# Patient Record
Sex: Female | Born: 1960
Health system: Southern US, Community
[De-identification: ages and names within clinical notes are randomized; demographics above are authoritative.]

## PROBLEM LIST (undated history)

## (undated) DIAGNOSIS — N029 Recurrent and persistent hematuria with unspecified morphologic changes: Secondary | ICD-10-CM

## (undated) DIAGNOSIS — J45909 Unspecified asthma, uncomplicated: Secondary | ICD-10-CM

## (undated) DIAGNOSIS — E785 Hyperlipidemia, unspecified: Secondary | ICD-10-CM

## (undated) DIAGNOSIS — M1990 Unspecified osteoarthritis, unspecified site: Secondary | ICD-10-CM

## (undated) DIAGNOSIS — I10 Essential (primary) hypertension: Secondary | ICD-10-CM

## (undated) DIAGNOSIS — F329 Major depressive disorder, single episode, unspecified: Secondary | ICD-10-CM

## (undated) DIAGNOSIS — M26609 Unspecified temporomandibular joint disorder, unspecified side: Secondary | ICD-10-CM

## (undated) DIAGNOSIS — F32A Depression, unspecified: Secondary | ICD-10-CM

## (undated) DIAGNOSIS — T7840XA Allergy, unspecified, initial encounter: Secondary | ICD-10-CM

## (undated) DIAGNOSIS — E559 Vitamin D deficiency, unspecified: Secondary | ICD-10-CM

## (undated) HISTORY — DX: Hyperlipidemia, unspecified: E78.5

## (undated) HISTORY — DX: Unspecified osteoarthritis, unspecified site: M19.90

## (undated) HISTORY — DX: Essential (primary) hypertension: I10

## (undated) HISTORY — DX: Unspecified temporomandibular joint disorder, unspecified side: M26.609

## (undated) HISTORY — DX: Recurrent and persistent hematuria with unspecified morphologic changes: N02.9

## (undated) HISTORY — DX: Major depressive disorder, single episode, unspecified: F32.9

## (undated) HISTORY — DX: Allergy, unspecified, initial encounter: T78.40XA

## (undated) HISTORY — DX: Unspecified asthma, uncomplicated: J45.909

## (undated) HISTORY — PX: ABDOMINAL HYSTERECTOMY: SHX81

## (undated) HISTORY — DX: Vitamin D deficiency, unspecified: E55.9

## (undated) HISTORY — PX: TUBAL LIGATION: SHX77

## (undated) HISTORY — PX: BREAST SURGERY: SHX581

## (undated) HISTORY — DX: Depression, unspecified: F32.A

---

## 1998-02-23 ENCOUNTER — Encounter: Payer: Self-pay | Admitting: Internal Medicine

## 1998-02-23 ENCOUNTER — Ambulatory Visit (HOSPITAL_COMMUNITY): Admission: RE | Admit: 1998-02-23 | Discharge: 1998-02-23 | Payer: Self-pay | Admitting: Internal Medicine

## 1999-07-11 ENCOUNTER — Encounter: Payer: Self-pay | Admitting: General Surgery

## 1999-07-13 ENCOUNTER — Encounter: Payer: Self-pay | Admitting: General Surgery

## 1999-07-13 ENCOUNTER — Encounter (INDEPENDENT_AMBULATORY_CARE_PROVIDER_SITE_OTHER): Payer: Self-pay | Admitting: Specialist

## 1999-07-13 ENCOUNTER — Ambulatory Visit (HOSPITAL_COMMUNITY): Admission: RE | Admit: 1999-07-13 | Discharge: 1999-07-13 | Payer: Self-pay | Admitting: General Surgery

## 1999-07-16 ENCOUNTER — Encounter: Payer: Self-pay | Admitting: General Surgery

## 2000-11-25 ENCOUNTER — Encounter: Admission: RE | Admit: 2000-11-25 | Discharge: 2000-11-25 | Payer: Self-pay | Admitting: *Deleted

## 2000-11-25 ENCOUNTER — Encounter: Payer: Self-pay | Admitting: *Deleted

## 2001-03-11 HISTORY — PX: TOTAL ABDOMINAL HYSTERECTOMY: SHX209

## 2001-05-14 ENCOUNTER — Ambulatory Visit (HOSPITAL_BASED_OUTPATIENT_CLINIC_OR_DEPARTMENT_OTHER): Admission: RE | Admit: 2001-05-14 | Discharge: 2001-05-14 | Payer: Self-pay | Admitting: Obstetrics and Gynecology

## 2001-05-14 ENCOUNTER — Encounter (INDEPENDENT_AMBULATORY_CARE_PROVIDER_SITE_OTHER): Payer: Self-pay | Admitting: Specialist

## 2001-07-02 ENCOUNTER — Inpatient Hospital Stay (HOSPITAL_COMMUNITY): Admission: RE | Admit: 2001-07-02 | Discharge: 2001-07-03 | Payer: Self-pay | Admitting: Obstetrics and Gynecology

## 2001-07-02 ENCOUNTER — Encounter (INDEPENDENT_AMBULATORY_CARE_PROVIDER_SITE_OTHER): Payer: Self-pay | Admitting: Specialist

## 2002-03-11 DIAGNOSIS — N029 Recurrent and persistent hematuria with unspecified morphologic changes: Secondary | ICD-10-CM

## 2002-03-11 HISTORY — DX: Recurrent and persistent hematuria with unspecified morphologic changes: N02.9

## 2002-03-29 ENCOUNTER — Encounter: Payer: Self-pay | Admitting: Internal Medicine

## 2002-03-29 ENCOUNTER — Ambulatory Visit (HOSPITAL_COMMUNITY): Admission: RE | Admit: 2002-03-29 | Discharge: 2002-03-29 | Payer: Self-pay | Admitting: Internal Medicine

## 2004-04-18 ENCOUNTER — Ambulatory Visit (HOSPITAL_COMMUNITY): Admission: RE | Admit: 2004-04-18 | Discharge: 2004-04-18 | Payer: Self-pay | Admitting: Internal Medicine

## 2008-06-29 ENCOUNTER — Other Ambulatory Visit: Admission: RE | Admit: 2008-06-29 | Discharge: 2008-06-29 | Payer: Self-pay | Admitting: Internal Medicine

## 2009-07-10 ENCOUNTER — Ambulatory Visit (HOSPITAL_COMMUNITY): Admission: RE | Admit: 2009-07-10 | Discharge: 2009-07-10 | Payer: Self-pay | Admitting: Internal Medicine

## 2009-08-22 ENCOUNTER — Ambulatory Visit (HOSPITAL_COMMUNITY): Admission: RE | Admit: 2009-08-22 | Discharge: 2009-08-22 | Payer: Self-pay | Admitting: Internal Medicine

## 2010-07-19 ENCOUNTER — Other Ambulatory Visit: Payer: Self-pay | Admitting: Internal Medicine

## 2010-07-19 ENCOUNTER — Other Ambulatory Visit (HOSPITAL_COMMUNITY)
Admission: RE | Admit: 2010-07-19 | Discharge: 2010-07-19 | Disposition: A | Discharge status: Home / Self Care | Payer: 59 | Source: Ambulatory Visit | Attending: Internal Medicine | Admitting: Internal Medicine

## 2010-07-19 DIAGNOSIS — Z01419 Encounter for gynecological examination (general) (routine) without abnormal findings: Secondary | ICD-10-CM | POA: Insufficient documentation

## 2010-07-26 ENCOUNTER — Ambulatory Visit
Admission: RE | Admit: 2010-07-26 | Discharge: 2010-07-26 | Disposition: A | Discharge status: Home / Self Care | Payer: 59 | Source: Ambulatory Visit | Attending: Internal Medicine | Admitting: Internal Medicine

## 2010-07-26 ENCOUNTER — Other Ambulatory Visit: Payer: Self-pay | Admitting: Internal Medicine

## 2010-07-26 DIAGNOSIS — R05 Cough: Secondary | ICD-10-CM

## 2010-07-27 NOTE — Discharge Summary (Signed)
[facility]  Patient:    Nicole, Phelps Visit Number: [id number] MRN: [id number]          Service Type: GYN Location: 4W 0444 01 Attending Physician:  Lendon Colonel Dictated by:   Kathie Rhodes. Kyra Manges, M.D. Admit Date:  07/02/2001 Discharge Date: 07/03/2001                             Discharge Summary  ADMISSION DIAGNOSIS:  Excessive menstruation.  DISCHARGE DIAGNOSIS:  Excessive menstruation.  PROCEDURE:  Total abdominal hysterectomy.  HISTORY OF PRESENT ILLNESS:  [name] is a 50 year old female who presented with heavy bleeding despite hysteroscopy, despite manipulation with birth control pills.  At hysteroscopy a large mucous myoma was resected; however, she continued to bleed and hemoglobin has dropped.  She desires definitive correction.  HOSPITAL COURSE:  She was admitted to the hospital.  Labs revealed a hemoglobin of 12.9, a hematocrit of 37.  She underwent an uneventful hysterectomy, was discharged the following evening on day 1 of her postop course to home and office care.  She was asked to return to the office in two weeks, to call for fever or bleeding.  She was given Percocet for pain.  CONDITION ON DISCHARGE:  Improved. Dictated by:   S. Kyra Manges, M.D. Attending Physician:  Lendon Colonel DD:  07/14/01 TD:  07/16/01 Job: [id number]

## 2010-07-27 NOTE — Op Note (Signed)
[facility]  Patient:    SAHORY, NORDLING                         MRN: [id number] Proc. Date: 07/13/99 Adm. Date:  [id number] Disc. Date: [id number] Attending:  Glenna Fellows [place]                           Operative Report  PREOPERATIVE DIAGNOSIS:  Left breast mass.  POSTOPERATIVE DIAGNOSIS:  Left breast mass.  SURGICAL PROCEDURE:  Needle localized left breast biopsy.  SURGEON:  Lorne Skeens. Hoxworth, M.D.  ANESTHESIA:  Local with IV sedation  BRIEF HISTORY:  Ms. Brigandi is a 50 year old white female, who, on recent screening mammogram was found to have a new nodular density in the medial left breast.  There was no definite palpable abnormality on physical examination. After discussion of options, we have elected to proceed with excisional biopsy.  The nature of the procedure, its indications, risk of bleeding, infection were discussed and understood.  She is now brought to the operating room for this procedure following successful needle localization.  DESCRIPTION OF OPERATION:  Patient brought to the operating room, placed in the supine position on the operating table and IV sedation was administered. The left breast was sterilely prepped and draped.  Local anesthesia was used to infiltrate the skin near the wire insertion site in the medial breast and the underlying breast tissue.   A transverse incision was made in the medial aspect of the breast and dissection carried down through the subcutaneous tissue to the breast capsule.  The wire was identified and brought into the incision and dissection was carried down along the wire and a discreet, approximately 1 x 2 cm  smooth, rounded mass consistent with a fibroadenoma was encountered.  It was completely sharply excised.  Hemostasis was obtained with cautery.  The subcu was reapproximated with interrupted 4-0 Monocryl and the skin closed with running subcuticular 4-0 Monocryl and  Steri-Strips. Sponge, needle and instrument counts were correct.  Dry sterile dressing was applied and patient taken to recovery in good condition. DD:  07/13/99 TD:  07/16/99 Job: [id number]

## 2010-07-27 NOTE — Op Note (Signed)
[facility]  Patient:    Nicole Phelps, Nicole Phelps Visit Number: [id number] MRN: [id number]          Service Type: NES Location: NESC Attending Physician:  Lendon Colonel Dictated by:   Kathie Rhodes. Kyra Manges, M.D. Proc. Date: 05/14/01 Admit Date:  05/14/2001                             Operative Report  PREOPERATIVE DIAGNOSES: 1. Abnormal uterine bleeding. 2. Menorrhagia.  POSTOPERATIVE DIAGNOSES: 1. Abnormal uterine bleeding. 2. Menorrhagia.  OPERATION:  Hysteroscopy with resection.  DESCRIPTION OF PROCEDURE:  The patient was placed in lithotomy position and prepped and draped in the usual fashion.  Cervix carefully dilated.  The endometrial cavity was retroverted.  Scope was inserted into the uterus, and the hysteroscope was done for resection.  There was a submucous nodule that was noted in the dome of the fundus.  This was resected, and a general endometrial resection was accomplished.  No unusual blood loss occurred.  All of the resected material was sent to the lab for study.  [name] tolerated it well. Dictated by:   S. Kyra Manges, M.D. Attending Physician:  Lendon Colonel DD:  05/14/01 TD:  05/14/01 Job: [id number]

## 2010-07-27 NOTE — H&P (Signed)
[facility]  Patient:    Nicole Phelps, Nicole Phelps Visit Number: [id number] MRN: [id number]          Service Type: NES Location: NESC Attending Physician:  Lendon Colonel Dictated by:   Kathie Rhodes. Kyra Manges, M.D. Admit Date:  05/14/2001 Discharge Date: 05/14/2001                           History and Physical  CHIEF COMPLAINT:  Heavy periods.  HISTORY OF PRESENT ILLNESS:  The patient is a 50 year old gravida 2, para 2 female who presents for continued bleeding despite hysteroscopy and manipulation with birth control pills.  At hysteroscopy she had a large submucous myoma that was resected.  Her hemoglobin has dropped a gram in a week, and so she desires to proceed with hysterectomy.  MEDICATIONS:  Effexor and Allegra-D.  PAST SURGICAL HISTORY: 1. Previous tubal ligation. 2. Two breast biopsies.  ALLERGIES:  No known allergies.  REVIEW OF SYSTEMS:  No headaches or dizziness.  No history of migraines. HEART:  No hypertension.  No rheumatic fever.  No heart murmur.  LUNGS:  No chronic cough.  No hemoptysis.  GENITOURINARY:  She denies stress incontinence or frequency.  No history of UTI.  GASTROINTESTINAL:  Negative.  MUSCLES, BONES, JOINTS:  No arthritis or fractures.  SOCIAL HISTORY:  She is a Haematologist.  Smokes two cigarettes daily.  FAMILY HISTORY:  Mother is living at 82.  Father died at 83.  Her father had a stroke and enlarged heart.  Her mother had colon cancer, as did an aunt.  PHYSICAL EXAMINATION:  GENERAL:  Well-developed and nourished female who appears to be her stated age.  VITAL SIGNS:  Weight 184.  Blood pressure 120/70.  HEENT:  Unremarkable.  Oropharynx is not injected.  NECK:  Supple.  Carotid pulses are equal, without bruits.  Thyroid is not enlarged.  Trachea is in the midline.  BREASTS:  No masses or tenderness.  HEART:  Normal sinus rhythm.  No murmurs.  LUNGS:  Clear to P&A.  ABDOMEN:  Soft, flat.  Liver,  spleen, and kidneys are not palpated.  Bowel sounds are normal.  No bruits are heard.  EXTREMITIES:  Good range of motion.  Equal pulses and reflexes.  PELVIC:  Clean cervix.  Uterus is retroverted.  It appears to be slightly enlarged and well supported.  RECTOVAGINAL:  Confirms.  IMPRESSION:  Continued abnormal uterine bleeding.  PLAN:  TAH.  The risks and benefits have been discussed with the patient. Dictated by:   S. Kyra Manges, M.D. Attending Physician:  Lendon Colonel DD:  07/01/01 TD:  07/01/01 Job: [phone]

## 2010-07-27 NOTE — Op Note (Signed)
[facility]  Patient:    Nicole Phelps, Nicole Phelps Visit Number: [id number] MRN: [id number]          Service Type: GYN Location: 1S X002 01 Attending Physician:  Lendon Colonel Dictated by:   Kathie Rhodes. Kyra Manges, M.D. Proc. Date: 07/02/01 Admit Date:  07/02/2001                             Operative Report  PREOPERATIVE DIAGNOSIS:  Persistent menorrhagia.  POSTOPERATIVE DIAGNOSIS:  Persistent menorrhagia.  OPERATION: 1. Examination under anesthesia. 2. Total abdominal hysterectomy. 3. Exploratory laparotomy.  SURGEON:  Katherine Roan, M.D.  ANESTHESIA:  General.  DESCRIPTION OF PROCEDURE:  The patient was placed in the operating room and general anesthesia administered. Examination under anesthesia revealed a retroverted, well supported uterus. No masses were noted. The patient was prepped and draped. Foley catheter was inserted. A transverse incision was made in the abdomen and extended in layers to the peritoneal cavity which was entered vertically. Exploration of the upper abdomen revealed a normal liver and gallbladder and two normal kidneys. No periaortic adenopathy or free fluid was noted. The abdominal viscera were then packed away from the pelvic viscera very carefully and the uterus was elevated with Kelly clamps. Both ovaries were normal. The utero-ovarian anastomosis was transected and ligated with 0 chromic sutures and free ties. The round ligaments were handled in a similar fashion. The uterine vessels were skeletonized and ligated, the cardinals and uterosacral ligaments were clamped, ligated, and tagged for identification. The angles of the vagina were entered and the specimen was removed from the operative field. The vagina was then closed as horizontal mattress sutures of 0 chromic and figure-of-eight sutures of 0 Vicryl. The vagina was closed in a vertical plane. Following this, her uterosacral and cardinal complexes were sutured  into the vagina for vault support. Reperitonealization was accomplished with 3-0 Vicryl. Hemostasis was secure. There was no unusual blood loss or complication. The parietal peritoneum was then closed with 2-0 PDS. Sponge and needle count was correct. The parietal peritoneum was closed with 2-0 PDS. The fascia was closed with a running suture of 2-0 PDS and two interrupted sutures of 0 Vicryl. Subcutaneum was irrigated and found to be hemostatically secure and the skin was closed with a subcuticular 4-0 PDS. A dry sterile dressing was applied and Marcaine 0.5% with epinephrine was instilled into the incision. Dictated by:   S. Kyra Manges, M.D. Attending Physician:  Lendon Colonel DD:  07/02/01 TD:  07/02/01 Job: [phone]

## 2011-07-22 ENCOUNTER — Ambulatory Visit (HOSPITAL_COMMUNITY)
Admission: RE | Admit: 2011-07-22 | Discharge: 2011-07-22 | Disposition: A | Discharge status: Home / Self Care | Payer: 59 | Source: Ambulatory Visit | Attending: Physician Assistant | Admitting: Physician Assistant

## 2011-07-22 ENCOUNTER — Other Ambulatory Visit (HOSPITAL_COMMUNITY)
Admission: RE | Admit: 2011-07-22 | Discharge: 2011-07-22 | Disposition: A | Discharge status: Home / Self Care | Payer: 59 | Source: Ambulatory Visit | Attending: Internal Medicine | Admitting: Internal Medicine

## 2011-07-22 ENCOUNTER — Other Ambulatory Visit: Payer: Self-pay

## 2011-07-22 ENCOUNTER — Other Ambulatory Visit (HOSPITAL_COMMUNITY): Payer: Self-pay | Admitting: Physician Assistant

## 2011-07-22 DIAGNOSIS — I10 Essential (primary) hypertension: Secondary | ICD-10-CM

## 2011-07-22 DIAGNOSIS — F17200 Nicotine dependence, unspecified, uncomplicated: Secondary | ICD-10-CM

## 2011-07-22 DIAGNOSIS — Z0000 Encounter for general adult medical examination without abnormal findings: Secondary | ICD-10-CM | POA: Insufficient documentation

## 2011-07-22 DIAGNOSIS — N760 Acute vaginitis: Secondary | ICD-10-CM | POA: Insufficient documentation

## 2011-07-22 DIAGNOSIS — Z01419 Encounter for gynecological examination (general) (routine) without abnormal findings: Secondary | ICD-10-CM | POA: Insufficient documentation

## 2011-10-07 ENCOUNTER — Emergency Department (HOSPITAL_COMMUNITY)
Admission: EM | Admit: 2011-10-07 | Discharge: 2011-10-07 | Disposition: A | Discharge status: Home / Self Care | Payer: 59 | Attending: Emergency Medicine | Admitting: Emergency Medicine

## 2011-10-07 ENCOUNTER — Emergency Department (HOSPITAL_COMMUNITY): Payer: 59

## 2011-10-07 ENCOUNTER — Encounter (HOSPITAL_COMMUNITY): Payer: Self-pay | Admitting: *Deleted

## 2011-10-07 DIAGNOSIS — R05 Cough: Secondary | ICD-10-CM

## 2011-10-07 DIAGNOSIS — R059 Cough, unspecified: Secondary | ICD-10-CM | POA: Insufficient documentation

## 2011-10-07 DIAGNOSIS — F17200 Nicotine dependence, unspecified, uncomplicated: Secondary | ICD-10-CM | POA: Insufficient documentation

## 2011-10-07 LAB — CBC
HCT: 40.5 % (ref 36.0–46.0)
Hemoglobin: 14.2 g/dL (ref 12.0–15.0)
MCHC: 35.1 g/dL (ref 30.0–36.0)
MCV: 89.2 fL (ref 78.0–100.0)
Platelets: 315 10*3/uL (ref 150–400)

## 2011-10-07 LAB — POCT I-STAT, CHEM 8
Calcium, Ion: 1.15 mmol/L (ref 1.12–1.23)
Creatinine, Ser: 0.9 mg/dL (ref 0.50–1.10)
Glucose, Bld: 112 mg/dL — ABNORMAL HIGH (ref 70–99)
HCT: 41 % (ref 36.0–46.0)
Hemoglobin: 13.9 g/dL (ref 12.0–15.0)
Potassium: 3.3 mEq/L — ABNORMAL LOW (ref 3.5–5.1)
Sodium: 141 mEq/L (ref 135–145)
TCO2: 21 mmol/L (ref 0–100)

## 2011-10-07 MED ORDER — MORPHINE SULFATE 4 MG/ML IJ SOLN
6.0000 mg | Freq: Once | INTRAMUSCULAR | Status: AC
  Administered 2011-10-07: 6 mg via INTRAVENOUS
  Filled 2011-10-07: qty 2

## 2011-10-07 MED ORDER — ONDANSETRON HCL 4 MG/2ML IJ SOLN
4.0000 mg | Freq: Once | INTRAMUSCULAR | Status: AC
  Administered 2011-10-07: 4 mg via INTRAVENOUS
  Filled 2011-10-07: qty 2

## 2011-10-07 MED ORDER — POTASSIUM CHLORIDE CRYS ER 20 MEQ PO TBCR
40.0000 meq | EXTENDED_RELEASE_TABLET | Freq: Once | ORAL | Status: AC
  Administered 2011-10-07: 40 meq via ORAL
  Filled 2011-10-07: qty 2

## 2011-10-07 MED ORDER — ALBUTEROL SULFATE (5 MG/ML) 0.5% IN NEBU
5.0000 mg | INHALATION_SOLUTION | Freq: Once | RESPIRATORY_TRACT | Status: AC
  Administered 2011-10-07: 5 mg via RESPIRATORY_TRACT
  Filled 2011-10-07: qty 1

## 2011-10-07 MED ORDER — AZITHROMYCIN 250 MG PO TABS: ORAL_TABLET | ORAL | Status: AC

## 2011-10-07 MED ORDER — AZITHROMYCIN 250 MG PO TABS
500.0000 mg | ORAL_TABLET | Freq: Once | ORAL | Status: AC
  Administered 2011-10-07: 500 mg via ORAL
  Filled 2011-10-07: qty 2

## 2011-10-07 MED ORDER — HYDROCOD POLST-CHLORPHEN POLST 10-8 MG/5ML PO LQCR: 5.0000 mL | Freq: Two times a day (BID) | ORAL | Status: DC

## 2011-10-07 NOTE — ED Provider Notes (Signed)
History     CSN: [id number]  Arrival date & time 10/07/11  1139   First MD Initiated Contact with Patient 10/07/11 1215      No chief complaint on file.   (Consider location/radiation/quality/duration/timing/severity/associated sxs/prior treatment) HPI Complains of nonproductive cough shortness of breath rhinorrhea nasal congestion and diffuse headache gradual in onset 4 days ago. Denies fever.seen by physician's assistant at Dr. Kathryne Sharper office this morning. Had breathing treatments which she states provided no relief. Presents here for further evaluation. Complains of bilateral rib pain worse with coughing. Nothing makes symptoms better. No other associated symptoms pain is nonradiating over bilateral RIBS sharp in quality History reviewed. No pertinent past medical history.  Past Surgical History  Procedure Date  . Abdominal hysterectomy   . Tubal ligation     History reviewed. No pertinent family history.  History  Substance Use Topics  . Smoking status: Current Everyday Smoker  . Smokeless tobacco: Never Used  . Alcohol Use: Yes   Marijuana use OB History    Grav Para Term Preterm Abortions TAB SAB Ect Mult Living                  Review of Systems  HENT: Positive for rhinorrhea.   Respiratory: Positive for cough.   Cardiovascular: Positive for chest pain.  Gastrointestinal: Positive for vomiting.        posttussive vomiting  Neurological: Positive for weakness and headaches.  All other systems reviewed and are negative.    Allergies  Vicodin  Home Medications   Current Outpatient Rx  Name Route Sig Dispense Refill  . ALBUTEROL SULFATE HFA 108 (90 BASE) MCG/ACT IN AERS Inhalation Inhale 2 puffs into the lungs every 6 (six) hours as needed. Shortness of breath    . CETIRIZINE HCL 10 MG PO TABS Oral Take 10 mg by mouth daily.    Marland Kitchen VITAMIN D 1000 UNITS PO TABS Oral Take 1,000 Units by mouth daily.    Marland Kitchen ESCITALOPRAM OXALATE 20 MG PO TABS Oral Take 20 mg by  mouth daily.    . ADULT MULTIVITAMIN W/MINERALS CH Oral Take 1 tablet by mouth daily.    Marland Kitchen PREGABALIN 25 MG PO CAPS Oral Take 25 mg by mouth daily.      BP 131/72  Pulse 83  Temp 98.1 F (36.7 C) (Oral)  Resp 14  SpO2 100%  Physical Exam  Nursing note and vitals reviewed. Constitutional: She is oriented to person, place, and time. She appears well-developed and well-nourished.  HENT:  Head: Normocephalic and atraumatic.  Eyes: Conjunctivae are normal. Pupils are equal, round, and reactive to light.  Neck: Neck supple. No tracheal deviation present. No thyromegaly present.  Cardiovascular: Normal rate and regular rhythm.   No murmur heard. Pulmonary/Chest: Effort normal and breath sounds normal.  Abdominal: Soft. Bowel sounds are normal. She exhibits no distension. There is no tenderness.  Musculoskeletal: Normal range of motion. She exhibits no edema and no tenderness.  Neurological: She is alert and oriented to person, place, and time. Coordination normal.  Skin: Skin is warm and dry. No rash noted.  Psychiatric: She has a normal mood and affect.    Date: 10/07/2011  Rate: 80  Rhythm: normal sinus rhythm  QRS Axis: normal  Intervals: normal  ST/T Wave abnormalities: normal  Conduction Disutrbances: none  Narrative Interpretation: unremarkable   no old EKG for comparison  ED Course  Procedures (including critical care time)  Labs Reviewed  CBC - Abnormal; Notable for the  following:    WBC 18.9 (*)     All other components within normal limits   No results found. Results for orders placed during the hospital encounter of 10/07/11  CBC      Component Value Range   WBC 18.9 (*) 4.0 - 10.5 K/uL   RBC 4.54  3.87 - 5.11 MIL/uL   Hemoglobin 14.2  12.0 - 15.0 g/dL   HCT 96.0  45.4 - 09.8 %   MCV 89.2  78.0 - 100.0 fL   MCH 31.3  26.0 - 34.0 pg   MCHC 35.1  30.0 - 36.0 g/dL   RDW 11.9  14.7 - 82.9 %   Platelets 315  150 - 400 K/uL  POCT I-STAT, CHEM 8       Component Value Range   Sodium 141  135 - 145 mEq/L   Potassium 3.3 (*) 3.5 - 5.1 mEq/L   Chloride 106  96 - 112 mEq/L   BUN 14  6 - 23 mg/dL   Creatinine, Ser 5.62  0.50 - 1.10 mg/dL   Glucose, Bld 130 (*) 70 - 99 mg/dL   Calcium, Ion 8.65  7.84 - 1.23 mmol/L   TCO2 21  0 - 100 mmol/L   Hemoglobin 13.9  12.0 - 15.0 g/dL   HCT 69.6  29.5 - 28.4 %   Dg Chest 2 View  10/07/2011  *RADIOLOGY REPORT*  Clinical Data: Chest pain  CHEST - 2 VIEW  C #3 hypokalemiaomparison: 07/22/2011  Findings: Normal heart size.  Clear lungs.  No pneumothorax and no pleural effusion.  No definite rib fracture.  IMPRESSION: No active cardiopulmonary disease.  Original Report Authenticated By: Donavan Burnet, M.D.    Chest x-ray reviewed by me No diagnosis found. of a the your he complaints  2:00 feels improved after treatment with morphine and Zofran states breathing almost normal 3:30 PM states breathing is normal after treatment with a beer all nebulizer. On reexamination she speaks in paragraphs occasional cough  MDM  Strongly doubt pulmonary embolism with gradual onset symptoms and cough, rhinorrhea in light of posttussive vomiting we'll treat with antibiotic consider pertussis Plan prescription Zithromax. Smoking cessation strongly encouraged spent 3-5 minutes counseling patient on stopping smoking albuterol 2 puffs every 4 hours when necessary shortness of breath or cough prescription Tussionex Diagnosis cough differential diagnosis includes atypical pneumonia, pertussis, bronchitis  #2 tobacco abuse #3 hypokalmia        Doug Sou, MD 10/07/11 1540

## 2011-10-07 NOTE — ED Notes (Signed)
Pt states started getting sick on Thursday, congested, states "I've been coughing up stuff and blowing out stuff". Pt states she can't breath and her toes, fingers and arms up to elbows are numb. Went to PCP this am and received breathing tx, pt states did not help, was sent here by PCP.

## 2011-10-07 NOTE — ED Notes (Signed)
Pt was sent by PCP today, went to PCP this am for congestion, HA, SOB and weakness, pt states sickness started on Thursday and has worsened. Pt was given duoneb at doctor's office, pt states did not help with the SOB. PCP sent pt to r/o PE vs. Panic attack. Pt is anxious and breathing is labored and fast. Pt states she is having chest pain d/t the difficulty breathing. States fingers and toes are numb, along with hands up to elbows are numb. Pt on cardiac monitor, 99% on RA. Pt instructed to take slow deep breaths.

## 2012-09-04 ENCOUNTER — Ambulatory Visit (HOSPITAL_COMMUNITY)
Admission: RE | Admit: 2012-09-04 | Discharge: 2012-09-04 | Disposition: A | Discharge status: Home / Self Care | Payer: 59 | Source: Ambulatory Visit | Attending: Internal Medicine | Admitting: Internal Medicine

## 2012-09-04 ENCOUNTER — Other Ambulatory Visit (HOSPITAL_COMMUNITY): Payer: Self-pay | Admitting: Internal Medicine

## 2012-09-04 DIAGNOSIS — R091 Pleurisy: Secondary | ICD-10-CM | POA: Insufficient documentation

## 2012-09-04 DIAGNOSIS — I10 Essential (primary) hypertension: Secondary | ICD-10-CM | POA: Insufficient documentation

## 2012-09-04 DIAGNOSIS — F17200 Nicotine dependence, unspecified, uncomplicated: Secondary | ICD-10-CM

## 2013-03-03 ENCOUNTER — Encounter: Payer: Self-pay | Admitting: Physician Assistant

## 2013-03-03 DIAGNOSIS — E785 Hyperlipidemia, unspecified: Secondary | ICD-10-CM

## 2013-03-03 DIAGNOSIS — E782 Mixed hyperlipidemia: Secondary | ICD-10-CM | POA: Insufficient documentation

## 2013-03-03 DIAGNOSIS — F329 Major depressive disorder, single episode, unspecified: Secondary | ICD-10-CM | POA: Insufficient documentation

## 2013-03-03 DIAGNOSIS — F32A Depression, unspecified: Secondary | ICD-10-CM

## 2013-03-03 DIAGNOSIS — E559 Vitamin D deficiency, unspecified: Secondary | ICD-10-CM | POA: Insufficient documentation

## 2013-03-03 DIAGNOSIS — I10 Essential (primary) hypertension: Secondary | ICD-10-CM

## 2013-03-09 ENCOUNTER — Ambulatory Visit (INDEPENDENT_AMBULATORY_CARE_PROVIDER_SITE_OTHER): Payer: 59 | Admitting: Physician Assistant

## 2013-03-09 ENCOUNTER — Encounter: Payer: Self-pay | Admitting: Physician Assistant

## 2013-03-09 VITALS — BP 120/78 | HR 76 | Temp 98.1°F | Resp 16 | Ht 65.0 in | Wt 186.0 lb

## 2013-03-09 DIAGNOSIS — E538 Deficiency of other specified B group vitamins: Secondary | ICD-10-CM

## 2013-03-09 DIAGNOSIS — I10 Essential (primary) hypertension: Secondary | ICD-10-CM

## 2013-03-09 DIAGNOSIS — E785 Hyperlipidemia, unspecified: Secondary | ICD-10-CM

## 2013-03-09 DIAGNOSIS — E559 Vitamin D deficiency, unspecified: Secondary | ICD-10-CM

## 2013-03-09 LAB — LIPID PANEL
LDL Cholesterol: 139 mg/dL — ABNORMAL HIGH (ref 0–99)
Triglycerides: 222 mg/dL — ABNORMAL HIGH (ref <150)
VLDL: 44 mg/dL — ABNORMAL HIGH (ref 0–40)

## 2013-03-09 LAB — CBC WITH DIFFERENTIAL/PLATELET
Eosinophils Absolute: 0.1 10*3/uL (ref 0.0–0.7)
Eosinophils Relative: 1 % (ref 0–5)
HCT: 41.9 % (ref 36.0–46.0)
Hemoglobin: 14.3 g/dL (ref 12.0–15.0)
Lymphs Abs: 2.8 10*3/uL (ref 0.7–4.0)
MCH: 29.9 pg (ref 26.0–34.0)
MCV: 87.7 fL (ref 78.0–100.0)
Monocytes Absolute: 0.5 10*3/uL (ref 0.1–1.0)
Monocytes Relative: 7 % (ref 3–12)
RBC: 4.78 MIL/uL (ref 3.87–5.11)

## 2013-03-09 LAB — BASIC METABOLIC PANEL WITH GFR
BUN: 12 mg/dL (ref 6–23)
CO2: 25 mEq/L (ref 19–32)
Calcium: 9.7 mg/dL (ref 8.4–10.5)
Creat: 0.78 mg/dL (ref 0.50–1.10)
GFR, Est African American: >89 mL/min
Glucose, Bld: 83 mg/dL (ref 70–99)

## 2013-03-09 MED ORDER — FLUTICASONE PROPIONATE 50 MCG/ACT NA SUSP: 1.0000 | Freq: Every day | NASAL | Status: DC

## 2013-03-09 NOTE — Patient Instructions (Addendum)
Cholesterol Cholesterol is a white, waxy, fat-like protein needed by your body in small amounts. The liver makes all the cholesterol you need. It is carried from the liver by the blood through the blood vessels. Deposits (plaque) may build up on blood vessel walls. This makes the arteries narrower and stiffer. Plaque increases the risk for heart attack and stroke. You cannot feel your cholesterol level even if it is very high. The only way to know is by a blood test to check your lipid (fats) levels. Once you know your cholesterol levels, you should keep a record of the test results. Work with your caregiver to to keep your levels in the desired range. WHAT THE RESULTS MEAN:  Total cholesterol is a rough measure of all the cholesterol in your blood.  LDL is the so-called bad cholesterol. This is the type that deposits cholesterol in the walls of the arteries. You want this level to be low.  HDL is the good cholesterol because it cleans the arteries and carries the LDL away. You want this level to be high.  Triglycerides are fat that the body can either burn for energy or store. High levels are closely linked to heart disease. DESIRED LEVELS:  Total cholesterol below 200.  LDL below 100 for people at risk, below 70 for very high risk.  HDL above 50 is good, above 60 is best.  Triglycerides below 150. HOW TO LOWER YOUR CHOLESTEROL:  Diet.  Choose fish or white meat chicken and [place], roasted or baked. Limit fatty cuts of red meat, fried foods, and processed meats, such as sausage and lunch meat.  Eat lots of fresh fruits and vegetables. Choose whole grains, beans, pasta, potatoes and cereals.  Use only small amounts of olive, corn or canola oils. Avoid butter, mayonnaise, shortening or palm kernel oils. Avoid foods with trans-fats.  Use skim/nonfat milk and low-fat/nonfat yogurt and cheeses. Avoid whole milk, cream, ice cream, egg yolks and cheeses. Healthy desserts include angel food  cake, ginger snaps, animal crackers, hard candy, popsicles, and low-fat/nonfat frozen yogurt. Avoid pastries, cakes, pies and cookies.  Exercise.  A regular program helps decrease LDL and raises HDL.  Helps with weight control.  Do things that increase your activity level like gardening, walking, or taking the stairs.  Medication.  May be prescribed by your caregiver to help lowering cholesterol and the risk for heart disease.  You may need medicine even if your levels are normal if you have several risk factors. HOME CARE INSTRUCTIONS   Follow your diet and exercise programs as suggested by your caregiver.  Take medications as directed.  Have blood work done when your caregiver feels it is necessary. MAKE SURE YOU:   Understand these instructions.  Will watch your condition.  Will get help right away if you are not doing well or get worse. Document Released: 11/20/2000 Document Revised: 05/20/2011 Document Reviewed: 05/13/2007 [facility] Patient Information 2014 [place], [place].    Bad carbs also include fruit juice, alcohol, and sweet tea. These are empty calories that do not signal to your brain that you are full.   Please remember the good carbs are still carbs which convert into sugar. So please measure them out no more than 1/2-1 cup of rice, oatmeal, pasta, and beans.  Veggies are however free foods! Pile them on.   I like lean protein at every meal such as chicken, [place], pork chops, cottage cheese, etc. Just do not fry these meats and please center your meal  around vegetable, the meats should be a side dish.   No all fruit is created equal. Please see the list below, the fruit at the bottom is higher in sugars than the fruit at the top   Vitamin B12 Deficiency- ADD SUBLINGUAL B12 Not having enough vitamin B12 is called a deficiency. Vitamin B12 is an important vitamin. Your body needs vitamin B12 to:   Make red blood cells.  Make DNA. This is the genetic  material inside all of your cells.  Help your nerves work properly so they can carry messages from your brain to your body. CAUSES  Not eating enough foods that contain vitamin B12.  Not having enough stomach acid and digestive juices. The body needs these to absorb vitamin B12 from the food you eat.  Having certain digestive system diseases that make it hard to absorb vitamin B12. These diseases include Crohn's disease, chronic pancreatitis, and cystic fibrosis.  Having pernicious anemia, which is a condition where the body has too few red blood cells. People with this condition do not make enough of a protein called "intrinsic factor," which is needed to absorb vitamin B12.  Having a surgery in which part of the stomach or small intestine is removed.  Taking certain medicines that make it hard for the body to absorb vitamin B12. These medicines include:  Heartburn medicine (antacids and proton pump inhibitors).  A certain antibiotic medicine called neomycin, which fights infection.  Some medicines used to treat diabetes, tuberculosis, gout, and high cholesterol. RISK FACTORS Risk factors are things that make you more likely to develop a vitamin B12 deficiency. They include:  Being older than 50.  Being a vegetarian.  Being pregnant and a vegetarian or having a poor diet.  Taking certain drugs.  Being an alcoholic. SYMPTOMS You may have a vitamin B12 deficiency with no symptoms. However, a vitamin B12 deficiency can cause health problems like anemia and nerve damage. These health problems can lead to many possible symptoms, including:  Weakness.  Fatigue.  Loss of appetite.  Weight loss.  Numbness or tingling in your hands and feet.  Redness and burning of the tongue.  Confusion or memory problems.  Depression.  Dizziness.  Sensory problems, such as loss of taste, color blindness, and ringing in the ears.  Diarrhea or constipation.  Trouble  walking. DIAGNOSIS Various types of tests can be given to help find the cause of your vitamin B12 deficiency. These tests include:  A complete blood count (CBC). This test gives your caregiver an overall picture of what makes up your blood.  A blood test to measure your B12 level.  A blood test to measure intrinsic factor.  An endoscopy. This procedure uses a thin tube with a camera on the end to look into your stomach or intestines. TREATMENT Treatment for vitamin B12 deficiency depends on what is causing it. Common options include:  Changing your eating and drinking habits, such as:  Eating more foods that contain vitamin B12.  Not drinking as much alcohol or any alcohol.  Taking vitamin B12 supplements. Your caregiver will tell you what dose is best for you.  Getting vitamin B12 injections. Some people get these a few times a week. Others get them once a month. HOME CARE INSTRUCTIONS  Take all supplements as directed by your caregiver. Follow the directions carefully.  Get any injections your caregiver prescribes. Do not miss your appointments.  Eat lots of healthy foods that contain vitamin B12. Ask your caregiver if  you should work with a nutritionist. Good things to include in your diet are:  Meat.  Poultry.  Fish.  Eggs.  Fortified cereal and dairy products. This means vitamin B12 has been added to the food. Check the label on the package to be sure.  Do not abuse alcohol.  Keep all follow-up appointments. Your caregiver will need to perform blood tests to make sure your vitamin B12 deficiency is going away. SEEK MEDICAL CARE IF:  You have any questions about your treatment.  Your symptoms come back. MAKE SURE YOU:  Understand these instructions.  Will watch your condition.  Will get help right away if you are not doing well or get worse. Document Released: 05/20/2011 Document Reviewed: 05/20/2011 [facility] Patient Information 2014 [place],  [place].  Palpitations  A palpitation is the feeling that your heartbeat is irregular. It may feel like your heart is fluttering or skipping a beat. It may also feel like your heart is beating faster than normal. This is usually not a serious problem. In some cases, you may need more medical tests. HOME CARE  Avoid:  Caffeine in coffee, tea, soft drinks, diet pills, and energy drinks.  Chocolate.  Alcohol.  Stop smoking if you smoke.  Reduce your stress and anxiety. Try:  A method that measures bodily functions so you can learn to control them (biofeedback).  Yoga.  Meditation.  Physical activity such as swimming, jogging, or walking.  Get plenty of rest and sleep. GET HELP RIGHT AWAY IF:   You have chest pain.  You feel short of breath.  You have a very bad headache.  You feel dizzy or pass out (faint).  Your fast or irregular heartbeat continues after 24 hours.  Your palpitations occur more often. MAKE SURE YOU:   Understand these instructions.  Will watch your condition.  Will get help right away if you are not doing well or get worse. Document Released: 12/05/2007 Document Revised: 08/27/2011 Document Reviewed: 04/26/2011 [facility] Patient Information 2014 [place], [place].

## 2013-03-09 NOTE — Progress Notes (Signed)
HPI Patient presents for 3 month follow up with hypertension, hyperlipidemia, and vitamin D. Patient's blood pressure has been controlled at home, today their BP is BP: 120/78 mmHg  Patient denies chest pain, shortness of breath, dizziness.  She states she has had heart fluttering for a long time but recently it has been happening more often the last few weeks. It is happening several times a day, lasts seconds, nonexertional, denies accompaniments with it. She does smoke and does drink caffeine.  Patient's cholesterol is diet controlled. The cholesterol last visit was LDL 134 B12 last visit was 321  States with Advair, even with washing her mouth out with Listerine she has a film on her mouth/tongue.  Patient is on Vitamin D supplement.  Vitamin D 71.  Current Medications:  Current Outpatient Prescriptions on File Prior to Visit  Medication Sig Dispense Refill  . albuterol (PROVENTIL HFA;VENTOLIN HFA) 108 (90 BASE) MCG/ACT inhaler Inhale 2 puffs into the lungs every 6 (six) hours as needed. Shortness of breath      . ALPRAZolam (XANAX) 1 MG tablet Take 1 mg by mouth at bedtime as needed for anxiety.      . cetirizine (ZYRTEC) 10 MG tablet Take 10 mg by mouth daily.      . chlorpheniramine-HYDROcodone (TUSSIONEX PENNKINETIC ER) 10-8 MG/5ML LQCR Take 5 mLs by mouth every 12 (twelve) hours.  60 mL  0  . cholecalciferol (VITAMIN D) 1000 UNITS tablet Take 2,000 Units by mouth daily.       Marland Kitchen escitalopram (LEXAPRO) 20 MG tablet Take 20 mg by mouth daily.      . Fluticasone-Salmeterol (ADVAIR) 250-50 MCG/DOSE AEPB Inhale 1 puff into the lungs 2 (two) times daily.      . Multiple Vitamin (MULTIVITAMIN WITH MINERALS) TABS Take 1 tablet by mouth daily.      . pregabalin (LYRICA) 25 MG capsule Take 25 mg by mouth daily.       No current facility-administered medications on file prior to visit.   Medical History:  Past Medical History  Diagnosis Date  . Hypertension   . Hyperlipidemia   . Depression    . Asthma   . Arthritis   . TMJ (temporomandibular joint syndrome)   . Benign hematuria 2004  . Allergy   . Vitamin D deficiency    Allergies:  Allergies  Allergen Reactions  . Celexa [Citalopram Hydrobromide]     Myalgias  . Vicodin [Hydrocodone-Acetaminophen] Nausea And Vomiting  . Wellbutrin [Bupropion]     anxiety    ROS Constitutional: Denies fever, chills, headaches, insomnia, fatigue, night sweats Eyes: Denies redness, blurred vision, diplopia, discharge, itchy, watery eyes.  ENT: Denies congestion, post nasal drip, sore throat, earache, dental pain, Tinnitus, Vertigo, Sinus pain, snoring.  Cardio: +palpitations Denies chest pain, irregular heartbeat, dyspnea, diaphoresis, orthopnea, PND, claudication, edema Respiratory: denies cough, shortness of breath, wheezing.  Gastrointestinal: Denies dysphagia, heartburn, AB pain/ cramps, N/V, diarrhea, constipation, hematemesis, melena, hematochezia,  hemorrhoids Genitourinary: Denies dysuria, frequency, urgency, nocturia, hesitancy, discharge, hematuria, flank pain Musculoskeletal: Denies myalgia, stiffness, pain, swelling and strain/sprain. Skin: Denies pruritis, rash, changing in skin lesion Neuro: Denies Weakness, tremor, incoordination, spasms, pain Psychiatric: Denies confusion, memory loss, sensory loss Endocrine: Denies change in weight, skin, hair change, nocturia Diabetic Polys, Denies visual blurring, hyper /hypo glycemic episodes, and paresthesia, Heme/Lymph: Denies Excessive bleeding, bruising, enlarged lymph nodes  Family history- Review and unchanged Social history- Review and unchanged Physical Exam: Filed Vitals:   03/09/13 1354  BP: 120/78  Pulse: 76  Temp: 98.1 F (36.7 C)  Resp: 16   Filed Weights   03/09/13 1354  Weight: 186 lb (84.369 kg)   General Appearance: Well nourished, in no apparent distress. Eyes: PERRLA, EOMs, conjunctiva no swelling or erythema Sinuses: No Frontal/maxillary  tenderness ENT/Mouth: Ext aud canals clear, TMs without erythema, bulging. No erythema, swelling, or exudate on post pharynx.  Tonsils not swollen or erythematous. Hearing normal.  Neck: Supple, thyroid normal.  Respiratory: Respiratory effort normal, BS equal bilaterally without rales, rhonchi, wheezing or stridor.  Cardio: RRR with no MRGs. Brisk peripheral pulses without edema.  Abdomen: Soft, + BS.  Non tender, no guarding, rebound, hernias, masses. Lymphatics: Non tender without lymphadenopathy.  Musculoskeletal: Full ROM, 5/5 strength, normal gait.  Skin: Warm, dry without rashes, lesions, ecchymosis.  Neuro: Cranial nerves intact. Normal muscle tone, no cerebellar symptoms. Sensation intact.  Psych: Awake and oriented X 3, normal affect, Insight and Judgment appropriate.   Assessment and Plan:  Hypertension: Continue medication, monitor blood pressure at home.  Continue DASH diet. Cholesterol: Continue diet and exercise. Check cholesterol.  Vitamin D Def- check level and continue medications.  B12 def- check B12.  Palpitations- likely benign, try to stop smoking and decrease caffeine. If get accompaniments let us know we can add BB and send to cardio.  COPD- Try symbicort samples given, 160/4.5 and wash mouth afterwards.  Continue diet and meds as discussed. Further disposition pending results of labs.  Quentin Mulling 2:02 PM

## 2013-03-10 LAB — VITAMIN B12: Vitamin B-12: 295 pg/mL (ref 211–911)

## 2013-03-10 LAB — TSH: TSH: 3.452 u[IU]/mL (ref 0.350–4.500)

## 2013-03-15 ENCOUNTER — Other Ambulatory Visit: Payer: Self-pay | Admitting: Physician Assistant

## 2013-06-07 ENCOUNTER — Other Ambulatory Visit: Payer: Self-pay | Admitting: Physician Assistant

## 2013-08-06 ENCOUNTER — Other Ambulatory Visit: Payer: Self-pay | Admitting: Physician Assistant

## 2013-08-26 ENCOUNTER — Telehealth: Payer: Self-pay | Admitting: Internal Medicine

## 2013-08-26 NOTE — Telephone Encounter (Signed)
RESCHEDULING CPE WITH AC

## 2013-09-01 ENCOUNTER — Encounter: Payer: Self-pay | Admitting: Physician Assistant

## 2013-09-01 ENCOUNTER — Telehealth: Payer: Self-pay

## 2013-09-01 DIAGNOSIS — E2839 Other primary ovarian failure: Secondary | ICD-10-CM

## 2013-09-01 NOTE — Telephone Encounter (Signed)
Patient requesting to have a bone density at the same time as mammogram. Can you put in order please? Patient also needs T-dap at CPE on 09-14-13

## 2013-09-14 ENCOUNTER — Ambulatory Visit (HOSPITAL_COMMUNITY)
Admission: RE | Admit: 2013-09-14 | Discharge: 2013-09-14 | Disposition: A | Discharge status: Home / Self Care | Payer: 59 | Source: Ambulatory Visit | Attending: Physician Assistant | Admitting: Physician Assistant

## 2013-09-14 ENCOUNTER — Encounter: Payer: Self-pay | Admitting: Physician Assistant

## 2013-09-14 ENCOUNTER — Ambulatory Visit (INDEPENDENT_AMBULATORY_CARE_PROVIDER_SITE_OTHER): Payer: 59 | Admitting: Physician Assistant

## 2013-09-14 ENCOUNTER — Other Ambulatory Visit: Payer: Self-pay

## 2013-09-14 VITALS — BP 122/78 | HR 64 | Temp 97.9°F | Resp 16 | Ht 65.0 in | Wt 185.0 lb

## 2013-09-14 DIAGNOSIS — I517 Cardiomegaly: Secondary | ICD-10-CM | POA: Insufficient documentation

## 2013-09-14 DIAGNOSIS — I10 Essential (primary) hypertension: Secondary | ICD-10-CM

## 2013-09-14 DIAGNOSIS — F17200 Nicotine dependence, unspecified, uncomplicated: Secondary | ICD-10-CM

## 2013-09-14 DIAGNOSIS — F329 Major depressive disorder, single episode, unspecified: Secondary | ICD-10-CM

## 2013-09-14 DIAGNOSIS — Z23 Encounter for immunization: Secondary | ICD-10-CM

## 2013-09-14 DIAGNOSIS — Z0000 Encounter for general adult medical examination without abnormal findings: Secondary | ICD-10-CM

## 2013-09-14 DIAGNOSIS — F32A Depression, unspecified: Secondary | ICD-10-CM

## 2013-09-14 LAB — CBC WITH DIFFERENTIAL/PLATELET
Basophils Absolute: 0.1 10*3/uL (ref 0.0–0.1)
Basophils Relative: 1 % (ref 0–1)
EOS PCT: 2 % (ref 0–5)
Eosinophils Absolute: 0.2 10*3/uL (ref 0.0–0.7)
HEMATOCRIT: 40.1 % (ref 36.0–46.0)
Hemoglobin: 14 g/dL (ref 12.0–15.0)
LYMPHS PCT: 24 % (ref 12–46)
Lymphs Abs: 1.9 10*3/uL (ref 0.7–4.0)
MCH: 31.2 pg (ref 26.0–34.0)
MCHC: 34.9 g/dL (ref 30.0–36.0)
MCV: 89.3 fL (ref 78.0–100.0)
MONO ABS: 0.5 10*3/uL (ref 0.1–1.0)
Monocytes Relative: 6 % (ref 3–12)
Neutro Abs: 5.4 10*3/uL (ref 1.7–7.7)
Neutrophils Relative %: 67 % (ref 43–77)
Platelets: 331 10*3/uL (ref 150–400)
RBC: 4.49 MIL/uL (ref 3.87–5.11)
RDW: 13.6 % (ref 11.5–15.5)
WBC: 8 10*3/uL (ref 4.0–10.5)

## 2013-09-14 LAB — HEMOGLOBIN A1C
HEMOGLOBIN A1C: 5.4 % (ref <5.7)
Mean Plasma Glucose: 108 mg/dL (ref <117)

## 2013-09-14 MED ORDER — FLUTICASONE FUROATE-VILANTEROL 100-25 MCG/INH IN AEPB: INHALATION_SPRAY | RESPIRATORY_TRACT | Status: DC

## 2013-09-14 MED ORDER — MONTELUKAST SODIUM 10 MG PO TABS: ORAL_TABLET | ORAL | Status: DC

## 2013-09-14 MED ORDER — ALBUTEROL SULFATE HFA 108 (90 BASE) MCG/ACT IN AERS: 2.0000 | INHALATION_SPRAY | Freq: Four times a day (QID) | RESPIRATORY_TRACT | Status: DC | PRN

## 2013-09-14 MED ORDER — ESCITALOPRAM OXALATE 20 MG PO TABS: ORAL_TABLET | ORAL | Status: DC

## 2013-09-14 MED ORDER — FLUTICASONE PROPIONATE 50 MCG/ACT NA SUSP: 1.0000 | Freq: Every day | NASAL | Status: DC

## 2013-09-14 MED ORDER — ALPRAZOLAM 1 MG PO TABS: 1.0000 mg | ORAL_TABLET | Freq: Every day | ORAL | Status: DC

## 2013-09-14 MED ORDER — PREGABALIN 25 MG PO CAPS: 25.0000 mg | ORAL_CAPSULE | Freq: Every evening | ORAL | Status: DC | PRN

## 2013-09-14 MED ORDER — ALPRAZOLAM 1 MG PO TABS: 1.0000 mg | ORAL_TABLET | Freq: Every evening | ORAL | Status: DC | PRN

## 2013-09-14 NOTE — Patient Instructions (Signed)

## 2013-09-14 NOTE — Progress Notes (Signed)
Complete Physical  Assessment and Plan: Hypertension-- continue medications, DASH diet, exercise and monitor at home. Call if greater than 130/80.   Hyperlipidemia--continue medications, check lipids, decrease fatty foods, increase activity.   Depression-remission with lexapro  Asthma- on advair, but would like to try new med  Arthritis- controlled  TMJ (temporomandibular joint syndrome)- wearing night guard and states it is better  Benign hematuria  Allergic rhinitis- Allegra OTC, increase H20, allergy hygiene explained.  Vitamin D deficiency  TDAP MGM and DEXA today Colonoscopy due in 2019 PAP next year Smoking cessation counseled- she is not ready to quit at this time.   Discussed med's effects and SE's. Screening labs and tests as requested with regular follow-up as recommended.  HPI 53 y.o. female  presents for a complete physical. Her blood pressure has been controlled at home, today their BP is BP: 122/78 mmHg She does workout, walks daily.  She denies chest pain, shortness of breath, dizziness.  She is not on cholesterol medication and denies myalgias. Her cholesterol is not at goal. The cholesterol last visit was:   Lab Results  Component Value Date   CHOL 236* 03/09/2013   HDL 53 03/09/2013   LDLCALC 139* 03/09/2013   TRIG 222* 03/09/2013   CHOLHDL 4.5 03/09/2013   Last A1C in the office was: 5.3 Has COPD and uses Advair and albuterol.  B12 was 321.  Patient is on Vitamin D supplement, 1200IU.   Lab Results  Component Value Date   VD25OH 89 03/09/2013   She continues to smoke, last CXR 09/04/2012 with bilateral lung thickening.    Current Medications:  Current Outpatient Prescriptions on File Prior to Visit  Medication Sig Dispense Refill  . albuterol (PROVENTIL HFA;VENTOLIN HFA) 108 (90 BASE) MCG/ACT inhaler Inhale 2 puffs into the lungs every 6 (six) hours as needed. Shortness of breath      . ALPRAZolam (XANAX) 1 MG tablet Take 1 mg by mouth at bedtime as  needed for anxiety.      . cetirizine (ZYRTEC) 10 MG tablet Take 10 mg by mouth daily.      . chlorpheniramine-HYDROcodone (TUSSIONEX PENNKINETIC ER) 10-8 MG/5ML LQCR Take 5 mLs by mouth every 12 (twelve) hours.  60 mL  0  . cholecalciferol (VITAMIN D) 1000 UNITS tablet Take 2,000 Units by mouth daily.       Marland Kitchen escitalopram (LEXAPRO) 20 MG tablet Take 1 tablet by mouth  daily  90 tablet  1  . fluticasone (FLONASE) 50 MCG/ACT nasal spray Place 1 spray into both nostrils daily.  16 g  2  . Fluticasone-Salmeterol (ADVAIR) 250-50 MCG/DOSE AEPB Inhale 1 puff into the lungs 2 (two) times daily.      . montelukast (SINGULAIR) 10 MG tablet Take 1 tablet by mouth  daily  90 tablet  2  . Multiple Vitamin (MULTIVITAMIN WITH MINERALS) TABS Take 1 tablet by mouth daily.      . pregabalin (LYRICA) 25 MG capsule Take 25 mg by mouth daily.       No current facility-administered medications on file prior to visit.   Health Maintenance:   Immunization History  Administered Date(s) Administered  . Pneumococcal-Unspecified 04/03/2009  . Td 04/03/2004   Tetanus:2006 will do TDAP this year, she is keeping 31 week old and has another grandson on the way (makes 5 total) Pneumovax: 2011 Flu vaccine: 2014 Zostavax: N/A Pap: 07/2011 due 2016 MGM: 07/2012 goes today  DEXA: goes today Colonoscopy: 08/2012 polyps due 5 years EGD: N/A CXR  09/04/2012 with bilateral lung thickening  Allergies:  Allergies  Allergen Reactions  . Celexa [Citalopram Hydrobromide]     Myalgias  . Vicodin [Hydrocodone-Acetaminophen] Nausea And Vomiting  . Wellbutrin [Bupropion]     anxiety   Medical History:  Past Medical History  Diagnosis Date  . Hypertension   . Hyperlipidemia   . Depression   . Asthma   . Arthritis   . TMJ (temporomandibular joint syndrome)   . Benign hematuria 2004  . Allergy   . Vitamin D deficiency    Surgical History:  Past Surgical History  Procedure Laterality Date  . Abdominal hysterectomy     . Tubal ligation    . Breast surgery Left     lumpectomy benign   Family History:  Family History  Problem Relation Age of Onset  . Cancer Mother     colon  . Arthritis Mother   . Hypertension Father   . Stroke Father    Social History:  History  Substance Use Topics  . Smoking status: Current Every Day Smoker -- 0.50 packs/day for 25 years  . Smokeless tobacco: Never Used  . Alcohol Use: Yes    Review of Systems: [X]  = complains of  [ ]  = denies  General: Fatigue [ ]  Fever [ ]  Chills [ ]  Weakness [ ]   Insomnia [ ] Weight change [ ]  Night sweats [ ]   Change in appetite [ ]  Eyes: Redness [ ]  Blurred vision [ ]  Diplopia [ ]  Discharge [ ]   ENT: Congestion [ ]  Sinus Pain [ ]  Post Nasal Drip [ ]  Sore Throat [ ]  Earache [ ]  hearing loss [ ]  Tinnitus [ ]  Snoring [ ]   Cardiac: Chest pain/pressure [ ]  SOB [ ]  Orthopnea [ ]   Palpitations [ ]   Paroxysmal nocturnal dyspnea[ ]  Claudication [ ]  Edema [ ]   Pulmonary: Cough [ ]  Wheezing[ ]   SOB [ ]   Pleurisy [ ]   GI: Nausea [ ]  Vomiting[ ]  Dysphagia[ ]  Heartburn[ ]  Abdominal pain [ ]  Constipation [ ] ; Diarrhea [ ]  BRBPR [ ]  Melena[ ]  Bloating [ ]  Hemorrhoids [ ]   GU: Hematuria[ ]  Dysuria [ ]  Nocturia[ ]  Urgency [ ]   Hesitancy [ ]  Discharge [ ]  Frequency [ ]   Breast:  Breast lumps [ ]   nipple discharge [ ]    Neuro: Headaches[ ]  Vertigo[ ]  Paresthesias[ ]  Spasm [ ]  Speech changes [ ]  Incoordination [ ]   Ortho: Arthritis [ ]  Joint pain [ ]  Muscle pain [ ]  Joint swelling [ ]  Back Pain [ ]  Skin:  Rash [ ]   Pruritis [ ]  Change in skin lesion [ ]   Psych: Depression[ ]  Anxiety[ ]  Confusion [ ]  Memory loss [ ]   Heme/Lypmh: Bleeding [ ]  Bruising [ ]  Enlarged lymph nodes [ ]   Endocrine: Visual blurring [ ]  Paresthesia [ ]  Polyuria [ ]  Polydypsea [ ]    Heat/cold intolerance [ ]  Hypoglycemia [ ]   Physical Exam: Estimated body mass index is 30.79 kg/(m^2) as calculated from the following:   Height as of this encounter: 5\' 5"  (1.651 m).   Weight as of  this encounter: 185 lb (83.915 kg). BP 122/78  Pulse 64  Temp(Src) 97.9 F (36.6 C)  Resp 16  Ht 5\' 5"  (1.651 m)  Wt 185 lb (83.915 kg)  BMI 30.79 kg/m2 General Appearance: Well nourished, in no apparent distress. Eyes: PERRLA, EOMs, conjunctiva no swelling or erythema, normal fundi and vessels. Sinuses: No Frontal/maxillary tenderness ENT/Mouth: Ext aud canals clear,  normal light reflex with TMs without erythema, bulging.  Good dentition. No erythema, swelling, or exudate on post pharynx. Tonsils not swollen or erythematous. Hearing normal.  Neck: Supple, thyroid normal. No bruits Respiratory: Respiratory effort normal, BS equal bilaterally without rales, rhonchi, wheezing or stridor. Cardio: RRR without murmurs, rubs or gallops. Brisk peripheral pulses without edema.  Chest: symmetric, with normal excursions and percussion. Breasts: Symmetric, without lumps, nipple discharge, retractions. Abdomen: Soft, +BS. Non tender, no guarding, rebound, hernias, masses, or organomegaly. .  Lymphatics: Non tender without lymphadenopathy.  Genitourinary:  Musculoskeletal: Full ROM all peripheral extremities,5/5 strength, and normal gait. Skin: Warm, dry without rashes, lesions, ecchymosis.  Neuro: Cranial nerves intact, reflexes equal bilaterally. Normal muscle tone, no cerebellar symptoms. Sensation intact.  Psych: Awake and oriented X 3, normal affect, Insight and Judgment appropriate.   EKG: WNL no changes. AORTA SCAN: WNL    Nicole Phelps 10:21 AM

## 2013-09-15 LAB — BASIC METABOLIC PANEL WITH GFR
BUN: 12 mg/dL (ref 6–23)
CALCIUM: 9.6 mg/dL (ref 8.4–10.5)
CHLORIDE: 104 meq/L (ref 96–112)
CO2: 25 mEq/L (ref 19–32)
Creat: 0.82 mg/dL (ref 0.50–1.10)
GFR, Est Non African American: 83 mL/min
Glucose, Bld: 89 mg/dL (ref 70–99)
Potassium: 4.4 mEq/L (ref 3.5–5.3)
Sodium: 140 mEq/L (ref 135–145)

## 2013-09-15 LAB — LIPID PANEL
CHOL/HDL RATIO: 3.7 ratio
Cholesterol: 192 mg/dL (ref 0–200)
HDL: 52 mg/dL (ref >39)
LDL Cholesterol: 112 mg/dL — ABNORMAL HIGH (ref 0–99)
TRIGLYCERIDES: 138 mg/dL (ref <150)
VLDL: 28 mg/dL (ref 0–40)

## 2013-09-15 LAB — IRON AND TIBC
%SAT: 20 % (ref 20–55)
Iron: 59 ug/dL (ref 42–145)
TIBC: 302 ug/dL (ref 250–470)
UIBC: 243 ug/dL (ref 125–400)

## 2013-09-15 LAB — HEPATIC FUNCTION PANEL
ALT: 17 U/L (ref 0–35)
AST: 15 U/L (ref 0–37)
Albumin: 4.1 g/dL (ref 3.5–5.2)
Alkaline Phosphatase: 97 U/L (ref 39–117)
BILIRUBIN DIRECT: 0.1 mg/dL (ref 0.0–0.3)
BILIRUBIN INDIRECT: 0.3 mg/dL (ref 0.2–1.2)
BILIRUBIN TOTAL: 0.4 mg/dL (ref 0.2–1.2)
Total Protein: 6.5 g/dL (ref 6.0–8.3)

## 2013-09-15 LAB — URINALYSIS, ROUTINE W REFLEX MICROSCOPIC
BILIRUBIN URINE: NEGATIVE
Glucose, UA: NEGATIVE mg/dL
KETONES UR: NEGATIVE mg/dL
Leukocytes, UA: NEGATIVE
Nitrite: NEGATIVE
Protein, ur: NEGATIVE mg/dL
Specific Gravity, Urine: 1.013 (ref 1.005–1.030)
UROBILINOGEN UA: 0.2 mg/dL (ref 0.0–1.0)
pH: 6.5 (ref 5.0–8.0)

## 2013-09-15 LAB — MICROALBUMIN / CREATININE URINE RATIO
CREATININE, URINE: 150.1 mg/dL
Microalb Creat Ratio: 4.7 mg/g (ref 0.0–30.0)
Microalb, Ur: 0.7 mg/dL (ref 0.00–1.89)

## 2013-09-15 LAB — INSULIN, FASTING: Insulin fasting, serum: 17 u[IU]/mL (ref 3–28)

## 2013-09-15 LAB — URINALYSIS, MICROSCOPIC ONLY
CRYSTALS: NONE SEEN
Casts: NONE SEEN

## 2013-09-15 LAB — MAGNESIUM: MAGNESIUM: 1.9 mg/dL (ref 1.5–2.5)

## 2013-09-15 LAB — VITAMIN D 25 HYDROXY (VIT D DEFICIENCY, FRACTURES): Vit D, 25-Hydroxy: 65 ng/mL (ref 30–89)

## 2013-09-15 LAB — VITAMIN B12: VITAMIN B 12: 527 pg/mL (ref 211–911)

## 2013-09-15 LAB — TSH: TSH: 1.719 u[IU]/mL (ref 0.350–4.500)

## 2013-10-18 ENCOUNTER — Other Ambulatory Visit: Payer: Self-pay | Admitting: Physician Assistant

## 2013-10-18 MED ORDER — FLUTICASONE-SALMETEROL 100-50 MCG/DOSE IN AEPB: 1.0000 | INHALATION_SPRAY | Freq: Two times a day (BID) | RESPIRATORY_TRACT | Status: DC

## 2013-11-01 ENCOUNTER — Telehealth: Payer: Self-pay | Admitting: *Deleted

## 2013-11-01 MED ORDER — FLUTICASONE-SALMETEROL 100-50 MCG/DOSE IN AEPB: 1.0000 | INHALATION_SPRAY | Freq: Two times a day (BID) | RESPIRATORY_TRACT | Status: DC

## 2013-11-01 NOTE — Telephone Encounter (Signed)
REFILL=   ADVAIR  100/50MG   PLEASE SEND IN 90DAY

## 2013-11-18 ENCOUNTER — Other Ambulatory Visit: Payer: Self-pay | Admitting: *Deleted

## 2013-11-18 MED ORDER — ALPRAZOLAM 1 MG PO TABS: 1.0000 mg | ORAL_TABLET | Freq: Every day | ORAL | Status: DC

## 2013-11-18 MED ORDER — FLUTICASONE-SALMETEROL 100-50 MCG/DOSE IN AEPB: 1.0000 | INHALATION_SPRAY | Freq: Two times a day (BID) | RESPIRATORY_TRACT | Status: DC

## 2014-01-29 ENCOUNTER — Encounter: Payer: Self-pay | Admitting: *Deleted

## 2014-01-29 DIAGNOSIS — E2839 Other primary ovarian failure: Secondary | ICD-10-CM

## 2014-02-22 ENCOUNTER — Other Ambulatory Visit: Payer: Self-pay | Admitting: Physician Assistant

## 2014-02-22 ENCOUNTER — Other Ambulatory Visit: Payer: Self-pay

## 2014-02-22 MED ORDER — FLUTICASONE-SALMETEROL 100-50 MCG/DOSE IN AEPB: 1.0000 | INHALATION_SPRAY | Freq: Two times a day (BID) | RESPIRATORY_TRACT | Status: DC

## 2014-06-14 ENCOUNTER — Other Ambulatory Visit: Payer: Self-pay | Admitting: Internal Medicine

## 2014-09-02 ENCOUNTER — Other Ambulatory Visit: Payer: Self-pay | Admitting: Physician Assistant

## 2014-09-16 ENCOUNTER — Other Ambulatory Visit (HOSPITAL_COMMUNITY)
Admission: RE | Admit: 2014-09-16 | Discharge: 2014-09-16 | Disposition: A | Discharge status: Home / Self Care | Payer: 59 | Source: Ambulatory Visit | Attending: Physician Assistant | Admitting: Physician Assistant

## 2014-09-16 ENCOUNTER — Ambulatory Visit (INDEPENDENT_AMBULATORY_CARE_PROVIDER_SITE_OTHER): Payer: 59 | Admitting: Physician Assistant

## 2014-09-16 ENCOUNTER — Encounter: Payer: Self-pay | Admitting: Internal Medicine

## 2014-09-16 ENCOUNTER — Encounter: Payer: Self-pay | Admitting: Physician Assistant

## 2014-09-16 VITALS — BP 110/70 | HR 68 | Temp 97.7°F | Resp 16 | Ht 65.0 in | Wt 181.0 lb

## 2014-09-16 DIAGNOSIS — Z1159 Encounter for screening for other viral diseases: Secondary | ICD-10-CM

## 2014-09-16 DIAGNOSIS — F32A Depression, unspecified: Secondary | ICD-10-CM

## 2014-09-16 DIAGNOSIS — E538 Deficiency of other specified B group vitamins: Secondary | ICD-10-CM

## 2014-09-16 DIAGNOSIS — N898 Other specified noninflammatory disorders of vagina: Secondary | ICD-10-CM

## 2014-09-16 DIAGNOSIS — F329 Major depressive disorder, single episode, unspecified: Secondary | ICD-10-CM

## 2014-09-16 DIAGNOSIS — E2839 Other primary ovarian failure: Secondary | ICD-10-CM

## 2014-09-16 DIAGNOSIS — E785 Hyperlipidemia, unspecified: Secondary | ICD-10-CM

## 2014-09-16 DIAGNOSIS — Z124 Encounter for screening for malignant neoplasm of cervix: Secondary | ICD-10-CM

## 2014-09-16 DIAGNOSIS — Z01419 Encounter for gynecological examination (general) (routine) without abnormal findings: Secondary | ICD-10-CM | POA: Diagnosis present

## 2014-09-16 DIAGNOSIS — Z1151 Encounter for screening for human papillomavirus (HPV): Secondary | ICD-10-CM | POA: Diagnosis present

## 2014-09-16 DIAGNOSIS — E559 Vitamin D deficiency, unspecified: Secondary | ICD-10-CM

## 2014-09-16 DIAGNOSIS — J45909 Unspecified asthma, uncomplicated: Secondary | ICD-10-CM

## 2014-09-16 DIAGNOSIS — N029 Recurrent and persistent hematuria with unspecified morphologic changes: Secondary | ICD-10-CM

## 2014-09-16 DIAGNOSIS — N760 Acute vaginitis: Secondary | ICD-10-CM | POA: Diagnosis present

## 2014-09-16 DIAGNOSIS — Z0001 Encounter for general adult medical examination with abnormal findings: Secondary | ICD-10-CM

## 2014-09-16 DIAGNOSIS — Z0000 Encounter for general adult medical examination without abnormal findings: Secondary | ICD-10-CM

## 2014-09-16 DIAGNOSIS — I10 Essential (primary) hypertension: Secondary | ICD-10-CM

## 2014-09-16 DIAGNOSIS — E669 Obesity, unspecified: Secondary | ICD-10-CM

## 2014-09-16 DIAGNOSIS — F17200 Nicotine dependence, unspecified, uncomplicated: Secondary | ICD-10-CM

## 2014-09-16 DIAGNOSIS — R6889 Other general symptoms and signs: Secondary | ICD-10-CM

## 2014-09-16 LAB — CBC WITH DIFFERENTIAL/PLATELET
BASOS ABS: 0 10*3/uL (ref 0.0–0.1)
Basophils Relative: 0 % (ref 0–1)
Eosinophils Absolute: 0.2 10*3/uL (ref 0.0–0.7)
Eosinophils Relative: 2 % (ref 0–5)
HCT: 39.9 % (ref 36.0–46.0)
HEMOGLOBIN: 13.3 g/dL (ref 12.0–15.0)
LYMPHS PCT: 23 % (ref 12–46)
Lymphs Abs: 1.9 10*3/uL (ref 0.7–4.0)
MCH: 30.8 pg (ref 26.0–34.0)
MCHC: 33.3 g/dL (ref 30.0–36.0)
MCV: 92.4 fL (ref 78.0–100.0)
MPV: 9.9 fL (ref 8.6–12.4)
Monocytes Absolute: 0.4 10*3/uL (ref 0.1–1.0)
Monocytes Relative: 5 % (ref 3–12)
NEUTROS PCT: 70 % (ref 43–77)
Neutro Abs: 5.8 10*3/uL (ref 1.7–7.7)
Platelets: 342 10*3/uL (ref 150–400)
RBC: 4.32 MIL/uL (ref 3.87–5.11)
RDW: 13.5 % (ref 11.5–15.5)
WBC: 8.3 10*3/uL (ref 4.0–10.5)

## 2014-09-16 LAB — LIPID PANEL
CHOL/HDL RATIO: 4.1 ratio
Cholesterol: 186 mg/dL (ref 0–200)
HDL: 45 mg/dL — ABNORMAL LOW (ref >=46)
LDL Cholesterol: 116 mg/dL — ABNORMAL HIGH (ref 0–99)
TRIGLYCERIDES: 126 mg/dL (ref <150)
VLDL: 25 mg/dL (ref 0–40)

## 2014-09-16 LAB — HEPATIC FUNCTION PANEL
ALT: 19 U/L (ref 0–35)
AST: 21 U/L (ref 0–37)
Albumin: 4.1 g/dL (ref 3.5–5.2)
Alkaline Phosphatase: 99 U/L (ref 39–117)
BILIRUBIN DIRECT: 0.1 mg/dL (ref 0.0–0.3)
BILIRUBIN INDIRECT: 0.6 mg/dL (ref 0.2–1.2)
BILIRUBIN TOTAL: 0.7 mg/dL (ref 0.2–1.2)
Total Protein: 6.4 g/dL (ref 6.0–8.3)

## 2014-09-16 LAB — BASIC METABOLIC PANEL WITH GFR
BUN: 11 mg/dL (ref 6–23)
CALCIUM: 9.2 mg/dL (ref 8.4–10.5)
CO2: 25 meq/L (ref 19–32)
Chloride: 106 mEq/L (ref 96–112)
Creat: 0.78 mg/dL (ref 0.50–1.10)
GFR, Est Non African American: 87 mL/min
GLUCOSE: 92 mg/dL (ref 70–99)
POTASSIUM: 4.1 meq/L (ref 3.5–5.3)
Sodium: 139 mEq/L (ref 135–145)

## 2014-09-16 LAB — IRON AND TIBC
%SAT: 32 % (ref 20–55)
IRON: 93 ug/dL (ref 42–145)
TIBC: 293 ug/dL (ref 250–470)
UIBC: 200 ug/dL (ref 125–400)

## 2014-09-16 LAB — VITAMIN B12: Vitamin B-12: 410 pg/mL (ref 211–911)

## 2014-09-16 LAB — MAGNESIUM: MAGNESIUM: 2 mg/dL (ref 1.5–2.5)

## 2014-09-16 LAB — TSH: TSH: 1.417 u[IU]/mL (ref 0.350–4.500)

## 2014-09-16 LAB — FERRITIN: Ferritin: 63 ng/mL (ref 10–291)

## 2014-09-16 MED ORDER — FLUTICASONE PROPIONATE 50 MCG/ACT NA SUSP: 1.0000 | Freq: Every day | NASAL | Status: DC

## 2014-09-16 MED ORDER — ALPRAZOLAM 1 MG PO TABS: 1.0000 mg | ORAL_TABLET | Freq: Every day | ORAL | Status: DC

## 2014-09-16 MED ORDER — ALBUTEROL SULFATE HFA 108 (90 BASE) MCG/ACT IN AERS: INHALATION_SPRAY | RESPIRATORY_TRACT | Status: DC

## 2014-09-16 MED ORDER — FLUTICASONE-SALMETEROL 100-50 MCG/DOSE IN AEPB: INHALATION_SPRAY | RESPIRATORY_TRACT | Status: DC

## 2014-09-16 MED ORDER — ESCITALOPRAM OXALATE 20 MG PO TABS: ORAL_TABLET | ORAL | Status: DC

## 2014-09-16 MED ORDER — MONTELUKAST SODIUM 10 MG PO TABS: ORAL_TABLET | ORAL | Status: DC

## 2014-09-16 NOTE — Progress Notes (Signed)
Complete Physical  Assessment and Plan: 1. Essential hypertension - continue medications, DASH diet, exercise and monitor at home. Call if greater than 130/80.  - CBC with Differential/Platelet - BASIC METABOLIC PANEL WITH GFR - Hepatic function panel - TSH - Urinalysis, Routine w reflex microscopic (not at [facility]) - Microalbumin / creatinine urine ratio - EKG 12-Lead - [place], RETROPERITNL ABD,  LTD  2. Hyperlipidemia -continue medications, check lipids, decrease fatty foods, increase activity.  - Lipid panel  3. Obesity Obesity with co morbidities- long discussion about weight loss, diet, and exercise  4. Vitamin D deficiency - Vit D  25 hydroxy (rtn osteoporosis monitoring)  5. Depression Depression- continue medications, stress management techniques discussed, increase water, good sleep hygiene discussed, increase exercise, and increase veggies.  - escitalopram (LEXAPRO) 20 MG tablet; Take 1 tablet by mouth  daily  Dispense: 90 tablet; Refill: 1 - ALPRAZolam (XANAX) 1 MG tablet; Take 1 tablet (1 mg total) by mouth daily.  Dispense: 90 tablet; Refill: 0  6. Smoker Smoking cessation-  instruction/counseling given, counseled patient on the dangers of tobacco use, advised patient to stop smoking, and reviewed strategies to maximize success, patient not ready to quit at this time.  - DG Chest 2 View; Future  7. Routine general medical examination at a health care facility - Magnesium  8. Estrogen deficiency Monitor DEXA  9. Asthma, unspecified asthma severity, uncomplicated controlled - montelukast (SINGULAIR) 10 MG tablet; Take 1 tablet by mouth  daily  Dispense: 90 tablet; Refill: 2 - Fluticasone-Salmeterol (ADVAIR DISKUS) 100-50 MCG/DOSE AEPB; Use 1 puff two times daily  Dispense: 180 each; Refill: 1 - albuterol (VENTOLIN HFA) 108 (90 BASE) MCG/ACT inhaler; Use 2 puffs every 6 hours  as needed for shortness of  breath  Dispense: 72 g; Refill: 2  10. Benign hematuria Check  urine  11. B12 deficiency - Iron and TIBC - Ferritin - Vitamin B12  12. Vaginal discharge Wet prep - Cytology - PAP  13. Cervical cancer screening - Cytology - PAP  14. Screening for viral disease - RPR - HIV antibody - Hepatitis C antibody  15. Vaginal nodule ? HPV genital warts versus mass- will send to OB/GYN, discussed with patient, she states her husband also has lesions on his penis, told him to come in for OV. Will get HPV typing with PAP.     Discussed med's effects and SE's. Screening labs and tests as requested with regular follow-up as recommended.  HPI 54 y.o. female  presents for a complete physical. Her blood pressure has been controlled at home, today their BP is BP: 110/70 mmHg She does workout, walks daily.  She denies chest pain, shortness of breath, dizziness.  She is not on cholesterol medication and denies myalgias. Her cholesterol is not at goal. The cholesterol last visit was:   Lab Results  Component Value Date   CHOL 192 09/14/2013   HDL 52 09/14/2013   LDLCALC 112* 09/14/2013   TRIG 138 09/14/2013   CHOLHDL 3.7 09/14/2013   Last A1C in the office was:  Lab Results  Component Value Date   HGBA1C 5.4 09/14/2013   Has COPD and uses Advair and albuterol. She continues to smoke, last CXR 09/04/2012 with bilateral lung thickening and mild cardiomegaly.  She is on advair does daily and has albuterol inhaler that she has used some with allergies. She is on singulair.  She is on Lexapro and xanax for anxiety/depression.  B12 was  Lab Results  Component Value Date  [id number] 09/14/2013   Patient is on Vitamin D supplement, 5000IU   Lab Results  Component Value Date   VD25OH 65 09/14/2013   BMI is Body mass index is 30.12 kg/(m^2)., she is working on diet and exercise. Wt Readings from Last 3 Encounters:  09/16/14 181 lb (82.101 kg)  09/14/13 185 lb (83.915 kg)  03/09/13 186 lb (84.369 kg)     Current Medications:  Current  Outpatient Prescriptions on File Prior to Visit  Medication Sig Dispense Refill  . ADVAIR DISKUS 100-50 MCG/DOSE AEPB Use 1 puff two times daily 180 each 1  . ALPRAZolam (XANAX) 1 MG tablet Take 1 tablet (1 mg total) by mouth daily. 90 tablet 0  . cetirizine (ZYRTEC) 10 MG tablet Take 10 mg by mouth daily.    . cholecalciferol (VITAMIN D) 1000 UNITS tablet Take 2,000 Units by mouth daily.     Marland Kitchen escitalopram (LEXAPRO) 20 MG tablet Take 1 tablet by mouth  daily 90 tablet 1  . fluticasone (FLONASE) 50 MCG/ACT nasal spray Use 1 spray in each nostril daily 16 g 0  . Fluticasone Furoate-Vilanterol (BREO ELLIPTA) 100-25 MCG/INH AEPB 2 puffs daily 60 each 11  . montelukast (SINGULAIR) 10 MG tablet Take 1 tablet by mouth  daily 90 tablet 2  . Multiple Vitamin (MULTIVITAMIN WITH MINERALS) TABS Take 1 tablet by mouth daily.    . VENTOLIN HFA 108 (90 BASE) MCG/ACT inhaler Use 2 puffs every 6 hours  as needed for shortness of  breath 72 g 1   No current facility-administered medications on file prior to visit.   Health Maintenance:   Immunization History  Administered Date(s) Administered  . Pneumococcal-Unspecified 04/03/2009  . Td 04/03/2004   TDAP: 09/2013 at pharmacy Pneumovax: 2011 Prevnar 13: due at age 89 Flu vaccine: 2015 at pharmacy Zostavax: N/A  Pap: 07/2011 due 2016 MGM: 09/2013 and has appointment today DEXA: 09/2013 normal Colonoscopy: 08/2012 polyps due 5 years 2019 EGD: N/A CXR 09/2013 stable cardiomegaly US pelvis 2011 DEE Dr. Sherlean Foot 3 years ago, reading glasses Dentist: Barney Drain, q 6 months  Allergies:  Allergies  Allergen Reactions  . Celexa [Citalopram Hydrobromide]     Myalgias  . Vicodin [Hydrocodone-Acetaminophen] Nausea And Vomiting  . Wellbutrin [Bupropion]     anxiety   Medical History:  Past Medical History  Diagnosis Date  . Hypertension   . Hyperlipidemia   . Depression   . Asthma   . Arthritis   . TMJ (temporomandibular joint syndrome)   .  Benign hematuria 2004  . Allergy   . Vitamin D deficiency    Surgical History:  Past Surgical History  Procedure Laterality Date  . Abdominal hysterectomy    . Tubal ligation    . Breast surgery Left     lumpectomy benign   Family History:  Family History  Problem Relation Age of Onset  . Cancer Mother     colon  . Arthritis Mother   . Hypertension Father   . Stroke Father    Social History:  History  Substance Use Topics  . Smoking status: Current Every Day Smoker -- 0.50 packs/day for 25 years  . Smokeless tobacco: Never Used  . Alcohol Use: Yes   Review of Systems  Constitutional: Positive for malaise/fatigue. Negative for fever, chills, weight loss and diaphoresis.  HENT: Positive for ear pain (left ear). Negative for congestion, ear discharge, hearing loss, nosebleeds, sore throat and tinnitus.   Eyes: Negative.   Respiratory: Positive for  cough. Negative for hemoptysis, sputum production, shortness of breath, wheezing and stridor.   Cardiovascular: Negative.   Gastrointestinal: Positive for heartburn. Negative for nausea, vomiting, abdominal pain, diarrhea, constipation, blood in stool and melena.  Genitourinary: Negative for dysuria, urgency, frequency, hematuria and flank pain.       Vaginal discharge  Musculoskeletal: Negative.   Skin: Negative.   Neurological: Negative.  Negative for weakness and headaches.  Psychiatric/Behavioral: Negative.  Negative for depression and memory loss. The patient is not nervous/anxious and does not have insomnia.    Physical Exam: Estimated body mass index is 30.12 kg/(m^2) as calculated from the following:   Height as of this encounter: 5\' 5"  (1.651 m).   Weight as of this encounter: 181 lb (82.101 kg). BP 110/70 mmHg  Pulse 68  Temp(Src) 97.7 F (36.5 C)  Resp 16  Ht 5\' 5"  (1.651 m)  Wt 181 lb (82.101 kg)  BMI 30.12 kg/m2 General Appearance: Well nourished, in no apparent distress. Eyes: PERRLA, EOMs, conjunctiva no  swelling or erythema, normal fundi and vessels. Sinuses: No Frontal/maxillary tenderness ENT/Mouth: Ext aud canals clear, normal light reflex with TMs without erythema, bulging.  Good dentition. No erythema, swelling, or exudate on post pharynx. Tonsils not swollen or erythematous. Hearing normal. Crowded mouth.  Neck: Supple, thyroid normal. No bruits Respiratory: Respiratory effort normal, BS equal bilaterally without rales, rhonchi, wheezing or stridor. Cardio: RRR without murmurs, rubs or gallops. Brisk peripheral pulses without edema.  Chest: symmetric, with normal excursions and percussion. Breasts: Symmetric, without lumps, nipple discharge, or retractions. Abdomen: Soft, +BS. + epigastic tenderness, no guarding, rebound, hernias, masses, or organomegaly. .  Lymphatics: Non tender without lymphadenopathy.  Genitourinary: VAGINA: PELVIC FLOOR EXAM: cystocele, vaginal mass/lesion, Right labia minor at 7 o clock, hard pediculated nodule , vaginal discharge - white, copious and malodorous, PAP: Pap smear done today but cervix NOT visualized, WET MOUNT done - results: sent off. Musculoskeletal: Full ROM all peripheral extremities,5/5 strength, and normal gait. Skin: Warm, dry without rashes, lesions, ecchymosis.  Neuro: Cranial nerves intact, reflexes equal bilaterally. Normal muscle tone, no cerebellar symptoms. Sensation intact.  Psych: Awake and oriented X 3, normal affect, Insight and Judgment appropriate.   EKG: WNL no changes. AORTA SCAN: defer   Vicie Mutters 9:52 AM

## 2014-09-16 NOTE — Patient Instructions (Addendum)
I think it is possible that you have sleep apnea. It can cause interrupted sleep, headaches, frequent awakenings, fatigue, dry mouth, fast/slow heart beats, memory issues, anxiety/depression, swelling, numbness tingling hands/feet, weight gain, shortness of breath, and the list goes on. Sleep apnea needs to be ruled out because if it is left untreated it does eventually lead to abnormal heart beats, lung failure or heart failure as well as increasing the risk of heart attack and stroke. There are masks you can wear OR a mouth piece that I can give you information about. Often times though people feel MUCH better after getting treatment.   Sleep Apnea  Sleep apnea is a sleep disorder characterized by abnormal pauses in breathing while you sleep. When your breathing pauses, the level of oxygen in your blood decreases. This causes you to move out of deep sleep and into light sleep. As a result, your quality of sleep is poor, and the system that carries your blood throughout your body (cardiovascular system) experiences stress. If sleep apnea remains untreated, the following conditions can develop:  High blood pressure (hypertension).  Coronary artery disease.  Inability to achieve or maintain an erection (impotence).  Impairment of your thought process (cognitive dysfunction). There are three types of sleep apnea: 1. Obstructive sleep apnea--Pauses in breathing during sleep because of a blocked airway. 2. Central sleep apnea--Pauses in breathing during sleep because the area of the brain that controls your breathing does not send the correct signals to the muscles that control breathing. 3. Mixed sleep apnea--A combination of both obstructive and central sleep apnea.  RISK FACTORS The following risk factors can increase your risk of developing sleep apnea:  Being overweight.  Smoking.  Having narrow passages in your nose and throat.  Being of older age.  Being female.  Alcohol use.   Sedative and tranquilizer use.  Ethnicity. Among individuals younger than 35 years, African Americans are at increased risk of sleep apnea. SYMPTOMS   Difficulty staying asleep.  Daytime sleepiness and fatigue.  Loss of energy.  Irritability.  Loud, heavy snoring.  Morning headaches.  Trouble concentrating.  Forgetfulness.  Decreased interest in sex. DIAGNOSIS  In order to diagnose sleep apnea, your caregiver will perform a physical examination. Your caregiver may suggest that you take a home sleep test. Your caregiver may also recommend that you spend the night in a sleep lab. In the sleep lab, several monitors record information about your heart, lungs, and brain while you sleep. Your leg and arm movements and blood oxygen level are also recorded. TREATMENT The following actions may help to resolve mild sleep apnea:  Sleeping on your side.   Using a decongestant if you have nasal congestion.   Avoiding the use of depressants, including alcohol, sedatives, and narcotics.   Losing weight and modifying your diet if you are overweight. There also are devices and treatments to help open your airway:  Oral appliances. These are custom-made mouthpieces that shift your lower jaw forward and slightly open your bite. This opens your airway.  Devices that create positive airway pressure. This positive pressure "splints" your airway open to help you breathe better during sleep. The following devices create positive airway pressure:  Continuous positive airway pressure (CPAP) device. The CPAP device creates a continuous level of air pressure with an air pump. The air is delivered to your airway through a mask while you sleep. This continuous pressure keeps your airway open.  Nasal expiratory positive airway pressure (EPAP) device. The EPAP device  creates positive air pressure as you exhale. The device consists of single-use valves, which are inserted into each nostril and held in  place by adhesive. The valves create very little resistance when you inhale but create much more resistance when you exhale. That increased resistance creates the positive airway pressure. This positive pressure while you exhale keeps your airway open, making it easier to breath when you inhale again.  Bilevel positive airway pressure (BPAP) device. The BPAP device is used mainly in patients with central sleep apnea. This device is similar to the CPAP device because it also uses an air pump to deliver continuous air pressure through a mask. However, with the BPAP machine, the pressure is set at two different levels. The pressure when you exhale is lower than the pressure when you inhale.  Surgery. Typically, surgery is only done if you cannot comply with less invasive treatments or if the less invasive treatments do not improve your condition. Surgery involves removing excess tissue in your airway to create a wider passage way. Document Released: 02/15/2002 Document Revised: 06/22/2012 Document Reviewed: 07/04/2011 [facility] Patient Information 2015 [place], [place]. This information is not intended to replace advice given to you by your health care provider. Make sure you discuss any questions you have with your health care provider.  Can call Dr. Toy Cookey to get evaluated for dental sleep appliance. # [phone]  OR you can try Dr. Clearence Ped in [facility] # [phone]  We will send notes. Call and get price quote on both.    If you have a smart phone, please look up Smoke Free app, this will help you stay on track and give you information about money you have saved, life that you have gained back and a ton of more information.   We are giving you chantix for smoking cessation. You can do it! And we are here to help! You may have heard some scary side effects about chantix, the three most common I hear about are nausea, crazy dreams and depression.  However, I like for my patients to try  to stay on 1/2 a tablet twice a day rather than one tablet twice a day as normally prescribed. This helps decrease the chances of side effects and helps save money by making a one month prescription last two months  Please start the prescription this way:  Start 1/2 tablet by mouth once daily after food with a full glass of water for 3 days Then do 1/2 tablet by mouth twice daily for 4 days. During this first week you can smoke, but try to stop after this week.  At this point we have several options: 1) continue on 1/2 tablet twice a day- which I encourage you to do. You can stay on this dose the rest of the time on the medication or if you still feel the need to smoke you can do one of the two options below. 2) do one tablet in the morning and 1/2 in the evening which helps decrease dreams. 3) do one tablet twice a day.   What if I miss a dose? If you miss a dose, take it as soon as you can. If it is almost time for your next dose, take only that dose. Do not take double or extra doses.  What should I watch for while using this medicine? Visit your doctor or health care professional for regular check ups. Ask for ongoing advice and encouragement from your doctor or  healthcare professional, friends, and family to help you quit. If you smoke while on this medication, quit again  Your mouth may get dry. Chewing sugarless gum or hard candy, and drinking plenty of water may help. Contact your doctor if the problem does not go away or is severe.  You may get drowsy or dizzy. Do not drive, use machinery, or do anything that needs mental alertness until you know how this medicine affects you. Do not stand or sit up quickly, especially if you are an older patient.   The use of this medicine may increase the chance of suicidal thoughts or actions. Pay special attention to how you are responding while on this medicine. Any worsening of mood, or thoughts of suicide or dying should be reported to your health  care professional right away.  ADVANTAGES OF QUITTING SMOKING  Within 20 minutes, blood pressure decreases. Your pulse is at normal level.  After 8 hours, carbon monoxide levels in the blood return to normal. Your oxygen level increases.  After 24 hours, the chance of having a heart attack starts to decrease. Your breath, hair, and body stop smelling like smoke.  After 48 hours, damaged nerve endings begin to recover. Your sense of taste and smell improve.  After 72 hours, the body is virtually free of nicotine. Your bronchial tubes relax and breathing becomes easier.  After 2 to 12 weeks, lungs can hold more air. Exercise becomes easier and circulation improves.  After 1 year, the risk of coronary heart disease is cut in half.  After 5 years, the risk of stroke falls to the same as a nonsmoker.  After 10 years, the risk of lung cancer is cut in half and the risk of other cancers decreases significantly.  After 15 years, the risk of coronary heart disease drops, usually to the level of a nonsmoker.  You will have extra money to spend on things other than cigarettes.  Preventive Care for Adults  A healthy lifestyle and preventive care can promote health and wellness. Preventive health guidelines for women include the following key practices.  A routine yearly physical is a good way to check with your health care provider about your health and preventive screening. It is a chance to share any concerns and updates on your health and to receive a thorough exam.  Visit your dentist for a routine exam and preventive care every 6 months. Brush your teeth twice a day and floss once a day. Good oral hygiene prevents tooth decay and gum disease.  The frequency of eye exams is based on your age, health, family medical history, use of contact lenses, and other factors. Follow your health care provider's recommendations for frequency of eye exams.  Eat a healthy diet. Foods like vegetables,  fruits, whole grains, low-fat dairy products, and lean protein foods contain the nutrients you need without too many calories. Decrease your intake of foods high in solid fats, added sugars, and salt. Eat the right amount of calories for you.Get information about a proper diet from your health care provider, if necessary.  Regular physical exercise is one of the most important things you can do for your health. Most adults should get at least 150 minutes of moderate-intensity exercise (any activity that increases your heart rate and causes you to sweat) each week. In addition, most adults need muscle-strengthening exercises on 2 or more days a week.  Maintain a healthy weight. The body mass index (BMI) is a screening tool to identify  possible weight problems. It provides an estimate of body fat based on height and weight. Your health care provider can find your BMI and can help you achieve or maintain a healthy weight.For adults 20 years and older:  A BMI below 18.5 is considered underweight.  A BMI of 18.5 to 24.9 is normal.  A BMI of 25 to 29.9 is considered overweight.  A BMI of 30 and above is considered obese.  Maintain normal blood lipids and cholesterol levels by exercising and minimizing your intake of saturated fat. Eat a balanced diet with plenty of fruit and vegetables. Blood tests for lipids and cholesterol should begin at age 35 and be repeated every 5 years. If your lipid or cholesterol levels are high, you are over 50, or you are at high risk for heart disease, you may need your cholesterol levels checked more frequently.Ongoing high lipid and cholesterol levels should be treated with medicines if diet and exercise are not working.  If you smoke, find out from your health care provider how to quit. If you do not use tobacco, do not start.  Lung cancer screening is recommended for adults aged 66-80 years who are at high risk for developing lung cancer because of a history of  smoking. A yearly low-dose CT scan of the lungs is recommended for people who have at least a 30-pack-year history of smoking and are a current smoker or have quit within the past 15 years. A pack year of smoking is smoking an average of 1 pack of cigarettes a day for 1 year (for example: 1 pack a day for 30 years or 2 packs a day for 15 years). Yearly screening should continue until the smoker has stopped smoking for at least 15 years. Yearly screening should be stopped for people who develop a health problem that would prevent them from having lung cancer treatment.  High blood pressure causes heart disease and increases the risk of stroke. Your blood pressure should be checked at least every 1 to 2 years. Ongoing high blood pressure should be treated with medicines if weight loss and exercise do not work.  If you are 57-77 years old, ask your health care provider if you should take aspirin to prevent strokes.  Diabetes screening involves taking a blood sample to check your fasting blood sugar level. This should be done once every 3 years, after age 52, if you are within normal weight and without risk factors for diabetes. Testing should be considered at a younger age or be carried out more frequently if you are overweight and have at least 1 risk factor for diabetes.  Breast cancer screening is essential preventive care for women. You should practice "breast self-awareness." This means understanding the normal appearance and feel of your breasts and may include breast self-examination. Any changes detected, no matter how small, should be reported to a health care provider. Women in their 104s and 30s should have a clinical breast exam (CBE) by a health care provider as part of a regular health exam every 1 to 3 years. After age 8, women should have a CBE every year. Starting at age 15, women should consider having a mammogram (breast X-ray test) every year. Women who have a family history of breast cancer  should talk to their health care provider about genetic screening. Women at a high risk of breast cancer should talk to their health care providers about having an MRI and a mammogram every year.  Breast cancer gene (BRCA)-related  cancer risk assessment is recommended for women who have family members with BRCA-related cancers. BRCA-related cancers include breast, ovarian, tubal, and peritoneal cancers. Having family members with these cancers may be associated with an increased risk for harmful changes (mutations) in the breast cancer genes BRCA1 and BRCA2. Results of the assessment will determine the need for genetic counseling and BRCA1 and BRCA2 testing.  Routine pelvic exams to screen for cancer are no longer recommended for nonpregnant women who are considered low risk for cancer of the pelvic organs (ovaries, uterus, and vagina) and who do not have symptoms. Ask your health care provider if a screening pelvic exam is right for you.  If you have had past treatment for cervical cancer or a condition that could lead to cancer, you need Pap tests and screening for cancer for at least 20 years after your treatment. If Pap tests have been discontinued, your risk factors (such as having a new sexual partner) need to be reassessed to determine if screening should be resumed. Some women have medical problems that increase the chance of getting cervical cancer. In these cases, your health care provider may recommend more frequent screening and Pap tests.  Colorectal cancer can be detected and often prevented. Most routine colorectal cancer screening begins at the age of 45 years and continues through age 71 years. However, your health care provider may recommend screening at an earlier age if you have risk factors for colon cancer. On a yearly basis, your health care provider may provide home test kits to check for hidden blood in the stool. Use of a small camera at the end of a tube, to directly examine the  colon (sigmoidoscopy or colonoscopy), can detect the earliest forms of colorectal cancer. Talk to your health care provider about this at age 64, when routine screening begins. Direct exam of the colon should be repeated every 5-10 years through age 45 years, unless early forms of pre-cancerous polyps or small growths are found.  Hepatitis C blood testing is recommended for all people born from [birth year] through 1965 and any individual with known risks for hepatitis C.  Pra  Osteoporosis is a disease in which the bones lose minerals and strength with aging. This can result in serious bone fractures or breaks. The risk of osteoporosis can be identified using a bone density scan. Women ages 27 years and over and women at risk for fractures or osteoporosis should discuss screening with their health care providers. Ask your health care provider whether you should take a calcium supplement or vitamin D to reduce the rate of osteoporosis.  Menopause can be associated with physical symptoms and risks. Hormone replacement therapy is available to decrease symptoms and risks. You should talk to your health care provider about whether hormone replacement therapy is right for you.  Use sunscreen. Apply sunscreen liberally and repeatedly throughout the day. You should seek shade when your shadow is shorter than you. Protect yourself by wearing long sleeves, pants, a wide-brimmed hat, and sunglasses year round, whenever you are outdoors.  Once a month, do a whole body skin exam, using a mirror to look at the skin on your back. Tell your health care provider of new moles, moles that have irregular borders, moles that are larger than a pencil eraser, or moles that have changed in shape or color.  Stay current with required vaccines (immunizations).  Influenza vaccine. All adults should be immunized every year.  Tetanus, diphtheria, and acellular pertussis (Td, Tdap) vaccine. Pregnant  women should receive 1 dose of  Tdap vaccine during each pregnancy. The dose should be obtained regardless of the length of time since the last dose. Immunization is preferred during the 27th-36th week of gestation. An adult who has not previously received Tdap or who does not know her vaccine status should receive 1 dose of Tdap. This initial dose should be followed by tetanus and diphtheria toxoids (Td) booster doses every 10 years. Adults with an unknown or incomplete history of completing a 3-dose immunization series with Td-containing vaccines should begin or complete a primary immunization series including a Tdap dose. Adults should receive a Td booster every 10 years.  Varicella vaccine. An adult without evidence of immunity to varicella should receive 2 doses or a second dose if she has previously received 1 dose. Pregnant females who do not have evidence of immunity should receive the first dose after pregnancy. This first dose should be obtained before leaving the health care facility. The second dose should be obtained 4-8 weeks after the first dose.  Human papillomavirus (HPV) vaccine. Females aged 13-26 years who have not received the vaccine previously should obtain the 3-dose series. The vaccine is not recommended for use in pregnant females. However, pregnancy testing is not needed before receiving a dose. If a female is found to be pregnant after receiving a dose, no treatment is needed. In that case, the remaining doses should be delayed until after the pregnancy. Immunization is recommended for any person with an immunocompromised condition through the age of 33 years if she did not get any or all doses earlier. During the 3-dose series, the second dose should be obtained 4-8 weeks after the first dose. The third dose should be obtained 24 weeks after the first dose and 16 weeks after the second dose.  Zoster vaccine. One dose is recommended for adults aged 18 years or older unless certain conditions are  present.  Measles, mumps, and rubella (MMR) vaccine. Adults born before 25 generally are considered immune to measles and mumps. Adults born in [birth year] or later should have 1 or more doses of MMR vaccine unless there is a contraindication to the vaccine or there is laboratory evidence of immunity to each of the three diseases. A routine second dose of MMR vaccine should be obtained at least 28 days after the first dose for students attending postsecondary schools, health care workers, or international travelers. People who received inactivated measles vaccine or an unknown type of measles vaccine during 1963-1967 should receive 2 doses of MMR vaccine. People who received inactivated mumps vaccine or an unknown type of mumps vaccine before 1979 and are at high risk for mumps infection should consider immunization with 2 doses of MMR vaccine. For females of childbearing age, rubella immunity should be determined. If there is no evidence of immunity, females who are not pregnant should be vaccinated. If there is no evidence of immunity, females who are pregnant should delay immunization until after pregnancy. Unvaccinated health care workers born before [birth year] who lack laboratory evidence of measles, mumps, or rubella immunity or laboratory confirmation of disease should consider measles and mumps immunization with 2 doses of MMR vaccine or rubella immunization with 1 dose of MMR vaccine.  Pneumococcal 13-valent conjugate (PCV13) vaccine. When indicated, a person who is uncertain of her immunization history and has no record of immunization should receive the PCV13 vaccine. An adult aged 42 years or older who has certain medical conditions and has not been previously immunized should receive  1 dose of PCV13 vaccine. This PCV13 should be followed with a dose of pneumococcal polysaccharide (PPSV23) vaccine. The PPSV23 vaccine dose should be obtained at least 8 weeks after the dose of PCV13 vaccine. An adult aged 75  years or older who has certain medical conditions and previously received 1 or more doses of PPSV23 vaccine should receive 1 dose of PCV13. The PCV13 vaccine dose should be obtained 1 or more years after the last PPSV23 vaccine dose.    Pneumococcal polysaccharide (PPSV23) vaccine. When PCV13 is also indicated, PCV13 should be obtained first. All adults aged 65 years and older should be immunized. An adult younger than age 34 years who has certain medical conditions should be immunized. Any person who resides in a nursing home or long-term care facility should be immunized. An adult smoker should be immunized. People with an immunocompromised condition and certain other conditions should receive both PCV13 and PPSV23 vaccines. People with human immunodeficiency virus (HIV) infection should be immunized as soon as possible after diagnosis. Immunization during chemotherapy or radiation therapy should be avoided. Routine use of PPSV23 vaccine is not recommended for American Indians, [place] Natives, or people younger than 65 years unless there are medical conditions that require PPSV23 vaccine. When indicated, people who have unknown immunization and have no record of immunization should receive PPSV23 vaccine. One-time revaccination 5 years after the first dose of PPSV23 is recommended for people aged 19-64 years who have chronic kidney failure, nephrotic syndrome, asplenia, or immunocompromised conditions. People who received 1-2 doses of PPSV23 before age 107 years should receive another dose of PPSV23 vaccine at age 52 years or later if at least 5 years have passed since the previous dose. Doses of PPSV23 are not needed for people immunized with PPSV23 at or after age 43 years.  Preventive Services / Frequency   Ages 42 to 64 years  Blood pressure check.  Lipid and cholesterol check.  Lung cancer screening. / Every year if you are aged 67-80 years and have a 30-pack-year history of smoking and  currently smoke or have quit within the past 15 years. Yearly screening is stopped once you have quit smoking for at least 15 years or develop a health problem that would prevent you from having lung cancer treatment.  Clinical breast exam.** / Every year after age 35 years.  BRCA-related cancer risk assessment.** / For women who have family members with a BRCA-related cancer (breast, ovarian, tubal, or peritoneal cancers).  Mammogram.** / Every year beginning at age 28 years and continuing for as long as you are in good health. Consult with your health care provider.  Pap test.** / Every 3 years starting at age 63 years through age 106 or 57 years with a history of 3 consecutive normal Pap tests.  HPV screening.** / Every 3 years from ages 12 years through ages 50 to 46 years with a history of 3 consecutive normal Pap tests.  Fecal occult blood test (FOBT) of stool. / Every year beginning at age 63 years and continuing until age 5 years. You may not need to do this test if you get a colonoscopy every 10 years.  Flexible sigmoidoscopy or colonoscopy.** / Every 5 years for a flexible sigmoidoscopy or every 10 years for a colonoscopy beginning at age 66 years and continuing until age 88 years.  Hepatitis C blood test.** / For all people born from [birth year] through 1965 and any individual with known risks for hepatitis C.  Skin self-exam. /  Monthly.  Influenza vaccine. / Every year.  Tetanus, diphtheria, and acellular pertussis (Tdap/Td) vaccine.** / Consult your health care provider. Pregnant women should receive 1 dose of Tdap vaccine during each pregnancy. 1 dose of Td every 10 years.  Varicella vaccine.** / Consult your health care provider. Pregnant females who do not have evidence of immunity should receive the first dose after pregnancy.  Zoster vaccine.** / 1 dose for adults aged 10 years or older.  Pneumococcal 13-valent conjugate (PCV13) vaccine.** / Consult your health care  provider.  Pneumococcal polysaccharide (PPSV23) vaccine.** / 1 to 2 doses if you smoke cigarettes or if you have certain conditions.  Meningococcal vaccine.** / Consult your health care provider.  Hepatitis A vaccine.** / Consult your health care provider.  Hepatitis B vaccine.** / Consult your health care provider. Screening for abdominal aortic aneurysm (AAA)  by ultrasound is recommended for people over 50 who have history of high blood pressure or who are current or former smokers.

## 2014-09-17 LAB — URINALYSIS, ROUTINE W REFLEX MICROSCOPIC
BILIRUBIN URINE: NEGATIVE
Glucose, UA: NEGATIVE mg/dL
Ketones, ur: NEGATIVE mg/dL
Leukocytes, UA: NEGATIVE
Nitrite: NEGATIVE
PROTEIN: NEGATIVE mg/dL
Specific Gravity, Urine: 1.019 (ref 1.005–1.030)
UROBILINOGEN UA: 0.2 mg/dL (ref 0.0–1.0)
pH: 7 (ref 5.0–8.0)

## 2014-09-17 LAB — RPR

## 2014-09-17 LAB — MICROALBUMIN / CREATININE URINE RATIO
Creatinine, Urine: 200.5 mg/dL
MICROALB UR: 1.7 mg/dL (ref <2.0)
Microalb Creat Ratio: 8.5 mg/g (ref 0.0–30.0)

## 2014-09-17 LAB — URINALYSIS, MICROSCOPIC ONLY
Casts: NONE SEEN
Crystals: NONE SEEN

## 2014-09-17 LAB — VITAMIN D 25 HYDROXY (VIT D DEFICIENCY, FRACTURES): Vit D, 25-Hydroxy: 40 ng/mL (ref 30–100)

## 2014-09-17 LAB — HIV ANTIBODY (ROUTINE TESTING W REFLEX): HIV 1&2 Ab, 4th Generation: NONREACTIVE

## 2014-09-17 LAB — HEPATITIS C ANTIBODY: HCV AB: NEGATIVE

## 2014-09-20 LAB — CYTOLOGY - PAP

## 2014-09-21 ENCOUNTER — Other Ambulatory Visit: Payer: Self-pay | Admitting: Physician Assistant

## 2014-09-21 LAB — CERVICOVAGINAL ANCILLARY ONLY
Bacterial vaginitis: POSITIVE — AB
Candida vaginitis: NEGATIVE

## 2014-09-21 MED ORDER — METRONIDAZOLE 500 MG PO TABS: 500.0000 mg | ORAL_TABLET | Freq: Two times a day (BID) | ORAL | Status: AC

## 2014-12-27 ENCOUNTER — Other Ambulatory Visit: Payer: Self-pay

## 2014-12-27 DIAGNOSIS — F32A Depression, unspecified: Secondary | ICD-10-CM

## 2014-12-27 DIAGNOSIS — F329 Major depressive disorder, single episode, unspecified: Secondary | ICD-10-CM

## 2014-12-27 MED ORDER — ESCITALOPRAM OXALATE 20 MG PO TABS: ORAL_TABLET | ORAL | Status: DC

## 2014-12-29 ENCOUNTER — Other Ambulatory Visit: Payer: Self-pay

## 2015-01-19 ENCOUNTER — Ambulatory Visit: Payer: 59 | Admitting: Podiatry

## 2015-02-19 ENCOUNTER — Other Ambulatory Visit: Payer: Self-pay | Admitting: Physician Assistant

## 2015-03-06 ENCOUNTER — Encounter: Payer: Self-pay | Admitting: *Deleted

## 2015-03-12 HISTORY — PX: ANKLE SURGERY: SHX546

## 2015-03-29 ENCOUNTER — Other Ambulatory Visit: Payer: Self-pay | Admitting: Physician Assistant

## 2015-03-29 MED ORDER — ESCITALOPRAM OXALATE 20 MG PO TABS: ORAL_TABLET | ORAL | Status: DC

## 2015-05-18 ENCOUNTER — Ambulatory Visit (INDEPENDENT_AMBULATORY_CARE_PROVIDER_SITE_OTHER): Payer: 59 | Admitting: Internal Medicine

## 2015-05-18 ENCOUNTER — Encounter: Payer: Self-pay | Admitting: Internal Medicine

## 2015-05-18 VITALS — BP 116/68 | HR 78 | Temp 98.0°F | Resp 16 | Ht 65.0 in

## 2015-05-18 DIAGNOSIS — E559 Vitamin D deficiency, unspecified: Secondary | ICD-10-CM

## 2015-05-18 DIAGNOSIS — R7303 Prediabetes: Secondary | ICD-10-CM | POA: Diagnosis not present

## 2015-05-18 DIAGNOSIS — I10 Essential (primary) hypertension: Secondary | ICD-10-CM | POA: Diagnosis not present

## 2015-05-18 DIAGNOSIS — F329 Major depressive disorder, single episode, unspecified: Secondary | ICD-10-CM

## 2015-05-18 DIAGNOSIS — F32A Depression, unspecified: Secondary | ICD-10-CM

## 2015-05-18 DIAGNOSIS — E785 Hyperlipidemia, unspecified: Secondary | ICD-10-CM

## 2015-05-18 DIAGNOSIS — Z79899 Other long term (current) drug therapy: Secondary | ICD-10-CM | POA: Diagnosis not present

## 2015-05-18 LAB — CBC WITH DIFFERENTIAL/PLATELET
BASOS ABS: 0.1 10*3/uL (ref 0.0–0.1)
Basophils Relative: 1 % (ref 0–1)
EOS ABS: 0.1 10*3/uL (ref 0.0–0.7)
EOS PCT: 2 % (ref 0–5)
HCT: 41.5 % (ref 36.0–46.0)
Hemoglobin: 14.4 g/dL (ref 12.0–15.0)
Lymphocytes Relative: 32 % (ref 12–46)
Lymphs Abs: 2.3 10*3/uL (ref 0.7–4.0)
MCH: 31.9 pg (ref 26.0–34.0)
MCHC: 34.7 g/dL (ref 30.0–36.0)
MCV: 92 fL (ref 78.0–100.0)
MPV: 10 fL (ref 8.6–12.4)
Monocytes Absolute: 0.5 10*3/uL (ref 0.1–1.0)
Monocytes Relative: 7 % (ref 3–12)
Neutro Abs: 4.2 10*3/uL (ref 1.7–7.7)
Neutrophils Relative %: 58 % (ref 43–77)
PLATELETS: 340 10*3/uL (ref 150–400)
RBC: 4.51 MIL/uL (ref 3.87–5.11)
RDW: 13.2 % (ref 11.5–15.5)
WBC: 7.2 10*3/uL (ref 4.0–10.5)

## 2015-05-18 LAB — LIPID PANEL
CHOL/HDL RATIO: 3.4 ratio (ref <=5.0)
Cholesterol: 193 mg/dL (ref 125–200)
HDL: 56 mg/dL (ref >=46)
LDL Cholesterol: 119 mg/dL (ref <130)
Triglycerides: 88 mg/dL (ref <150)
VLDL: 18 mg/dL (ref <30)

## 2015-05-18 LAB — HEPATIC FUNCTION PANEL
ALBUMIN: 4 g/dL (ref 3.6–5.1)
ALK PHOS: 82 U/L (ref 33–130)
ALT: 14 U/L (ref 6–29)
AST: 14 U/L (ref 10–35)
BILIRUBIN INDIRECT: 0.6 mg/dL (ref 0.2–1.2)
BILIRUBIN TOTAL: 0.7 mg/dL (ref 0.2–1.2)
Bilirubin, Direct: 0.1 mg/dL (ref <=0.2)
Total Protein: 6.4 g/dL (ref 6.1–8.1)

## 2015-05-18 LAB — BASIC METABOLIC PANEL WITH GFR
BUN: 16 mg/dL (ref 7–25)
CO2: 26 mmol/L (ref 20–31)
Calcium: 9.5 mg/dL (ref 8.6–10.4)
Chloride: 103 mmol/L (ref 98–110)
Creat: 0.88 mg/dL (ref 0.50–1.05)
GFR, EST AFRICAN AMERICAN: 86 mL/min (ref >=60)
GFR, EST NON AFRICAN AMERICAN: 75 mL/min (ref >=60)
Glucose, Bld: 84 mg/dL (ref 65–99)
POTASSIUM: 4.5 mmol/L (ref 3.5–5.3)
Sodium: 141 mmol/L (ref 135–146)

## 2015-05-18 LAB — TSH: TSH: 0.96 mIU/L

## 2015-05-18 MED ORDER — AZELASTINE HCL 0.1 % NA SOLN: 2.0000 | Freq: Two times a day (BID) | NASAL | Status: DC

## 2015-05-18 MED ORDER — PREDNISONE 20 MG PO TABS: ORAL_TABLET | ORAL | Status: DC

## 2015-05-18 NOTE — Progress Notes (Signed)
Assessment and Plan:  Hypertension:  -Continue medication,  -monitor blood pressure at home.  -Continue DASH diet.   -Reminder to go to the ER if any CP, SOB, nausea, dizziness, severe HA, changes vision/speech, left arm numbness and tingling, and jaw pain.  Cholesterol: -Continue diet and exercise.  -Check cholesterol.   Pre-diabetes: -Continue diet and exercise.  -Check A1C  Vitamin D Def: -check level -continue medications.   Depression -stop lexapro -start trintellix 5 mg x 1 week then go up to 10 mg -if doing well will send in prescription  Dizziness -possible middle ear effusion from allergies -prednisone -astelin  Continue diet and meds as discussed. Further disposition pending results of labs.  HPI 55 y.o. female  presents for 3 month follow up with hypertension, hyperlipidemia, prediabetes and vitamin D.   Her blood pressure has been controlled at home, today their BP is BP: 116/68 mmHg.   She does not workout. She denies chest pain, shortness of breath, dizziness.   She is on cholesterol medication and denies myalgias. Her cholesterol is at goal. The cholesterol last visit was:   Lab Results  Component Value Date   CHOL 186 09/16/2014   HDL 45* 09/16/2014   LDLCALC 116* 09/16/2014   TRIG 126 09/16/2014   CHOLHDL 4.1 09/16/2014     She has been working on diet and exercise for prediabetes, and denies foot ulcerations, hyperglycemia, hypoglycemia , increased appetite, nausea, paresthesia of the feet, polydipsia, polyuria, visual disturbances, vomiting and weight loss. Last A1C in the office was:  Lab Results  Component Value Date   HGBA1C 5.4 09/14/2013    Patient is on Vitamin D supplement.  Lab Results  Component Value Date   VD25OH 40 09/16/2014      Patient reports that she has been having some issues with dizziness and also with some nausea with vomiting.  She reports that she feels like she is always leaning to the left.  She did actually fall  and hurt her ankle.    She does have a history of very severe allergies.    She does feel like the lexapro is causing her to have severe body aches and feels like this is related to her being on the generic medication of the lexapro.     Current Medications:  Current Outpatient Prescriptions on File Prior to Visit  Medication Sig Dispense Refill  . albuterol (VENTOLIN HFA) 108 (90 BASE) MCG/ACT inhaler Use 2 puffs every 6 hours  as needed for shortness of  breath 72 g 2  . ALPRAZolam (XANAX) 1 MG tablet Take 1 tablet (1 mg total) by mouth daily. 90 tablet 0  . cetirizine (ZYRTEC) 10 MG tablet Take 10 mg by mouth daily.    . cholecalciferol (VITAMIN D) 1000 UNITS tablet Take 2,000 Units by mouth daily.     Marland Kitchen escitalopram (LEXAPRO) 20 MG tablet Take 1 tablet daily for mood - please schedule Office Visit 90 tablet 0  . fluticasone (FLONASE) 50 MCG/ACT nasal spray Place 1 spray into both nostrils daily. 16 g 2  . Fluticasone-Salmeterol (ADVAIR DISKUS) 100-50 MCG/DOSE AEPB Use 1 puff two times daily 180 each 1  . montelukast (SINGULAIR) 10 MG tablet Take 1 tablet by mouth  daily 90 tablet 2  . Multiple Vitamin (MULTIVITAMIN WITH MINERALS) TABS Take 1 tablet by mouth daily.     No current facility-administered medications on file prior to visit.    Medical History:  Past Medical History  Diagnosis Date  .  Hypertension   . Hyperlipidemia   . Depression   . Asthma   . Arthritis   . TMJ (temporomandibular joint syndrome)   . Benign hematuria 2004  . Allergy   . Vitamin D deficiency     Allergies:  Allergies  Allergen Reactions  . Celexa [Citalopram Hydrobromide]     Myalgias  . Vicodin [Hydrocodone-Acetaminophen] Nausea And Vomiting  . Wellbutrin [Bupropion]     anxiety     Review of Systems:  Review of Systems  Constitutional: Negative for fever, chills and malaise/fatigue.  HENT: Negative for congestion, ear pain and sore throat.   Respiratory: Negative for cough,  shortness of breath and wheezing.   Cardiovascular: Negative for chest pain, palpitations and leg swelling.  Gastrointestinal: Negative for heartburn, diarrhea, constipation, blood in stool and melena.  Genitourinary: Negative.   Musculoskeletal: Positive for myalgias.  Neurological: Negative for dizziness, sensory change, loss of consciousness and headaches.  Psychiatric/Behavioral: Negative for depression. The patient has insomnia (secondary to night sweats.). The patient is not nervous/anxious.     Family history- Review and unchanged  Social history- Review and unchanged  Physical Exam: BP 116/68 mmHg  Pulse 78  Temp(Src) 98 F (36.7 C) (Temporal)  Resp 16  Ht 5\' 5"  (1.651 m) Wt Readings from Last 3 Encounters:  09/16/14 181 lb (82.101 kg)  09/14/13 185 lb (83.915 kg)  03/09/13 186 lb (84.369 kg)    General Appearance: Well nourished well developed, in no apparent distress. Eyes: PERRLA, EOMs, conjunctiva no swelling or erythema ENT/Mouth: Ear canals normal without obstruction, swelling, erythma, discharge.  TMs normal bilaterally.  Oropharynx moist, clear, without exudate, or postoropharyngeal swelling. Neck: Supple, thyroid normal,no cervical adenopathy  Respiratory: Respiratory effort normal, Breath sounds clear A&P without rhonchi, wheeze, or rale.  No retractions, no accessory usage. Cardio: RRR with no MRGs. Brisk peripheral pulses without edema.  Abdomen: Soft, + BS,  Non tender, no guarding, rebound, hernias, masses. Musculoskeletal: Full ROM, 5/5 strength, Normal gait Skin: Warm, dry without rashes, lesions, ecchymosis.  Neuro: Awake and oriented X 3, Cranial nerves intact. Normal muscle tone, no cerebellar symptoms. Psych: Normal affect, Insight and Judgment appropriate.    Starlyn Skeans, PA-C 10:41 AM [facility]

## 2015-05-19 LAB — HEMOGLOBIN A1C
HEMOGLOBIN A1C: 5.5 % (ref <5.7)
Mean Plasma Glucose: 111 mg/dL (ref <117)

## 2015-06-07 ENCOUNTER — Other Ambulatory Visit: Payer: Self-pay | Admitting: *Deleted

## 2015-06-07 MED ORDER — VORTIOXETINE HBR 10 MG PO TABS: 10.0000 mg | ORAL_TABLET | Freq: Every day | ORAL | Status: DC

## 2015-06-08 ENCOUNTER — Other Ambulatory Visit: Payer: Self-pay | Admitting: *Deleted

## 2015-06-08 MED ORDER — VORTIOXETINE HBR 10 MG PO TABS: 10.0000 mg | ORAL_TABLET | Freq: Every day | ORAL | Status: DC

## 2015-06-12 ENCOUNTER — Other Ambulatory Visit: Payer: Self-pay | Admitting: *Deleted

## 2015-06-12 MED ORDER — VORTIOXETINE HBR 10 MG PO TABS: 10.0000 mg | ORAL_TABLET | Freq: Every day | ORAL | Status: DC

## 2015-06-25 ENCOUNTER — Other Ambulatory Visit: Payer: Self-pay | Admitting: Physician Assistant

## 2015-06-25 DIAGNOSIS — J4520 Mild intermittent asthma, uncomplicated: Secondary | ICD-10-CM

## 2015-07-03 ENCOUNTER — Ambulatory Visit (INDEPENDENT_AMBULATORY_CARE_PROVIDER_SITE_OTHER): Payer: 59 | Admitting: Internal Medicine

## 2015-07-03 ENCOUNTER — Encounter: Payer: Self-pay | Admitting: Internal Medicine

## 2015-07-03 VITALS — BP 144/86 | HR 78 | Temp 97.8°F | Resp 18 | Ht 65.0 in

## 2015-07-03 DIAGNOSIS — R42 Dizziness and giddiness: Secondary | ICD-10-CM

## 2015-07-03 DIAGNOSIS — R1013 Epigastric pain: Secondary | ICD-10-CM | POA: Diagnosis not present

## 2015-07-03 DIAGNOSIS — Z79899 Other long term (current) drug therapy: Secondary | ICD-10-CM | POA: Diagnosis not present

## 2015-07-03 LAB — CBC WITH DIFFERENTIAL/PLATELET
BASOS PCT: 0 %
Basophils Absolute: 0 cells/uL (ref 0–200)
Eosinophils Absolute: 100 cells/uL (ref 15–500)
Eosinophils Relative: 1 %
HEMATOCRIT: 39.1 % (ref 35.0–45.0)
HEMOGLOBIN: 13.1 g/dL (ref 11.7–15.5)
LYMPHS ABS: 3000 {cells}/uL (ref 850–3900)
Lymphocytes Relative: 30 %
MCH: 30.4 pg (ref 27.0–33.0)
MCHC: 33.5 g/dL (ref 32.0–36.0)
MCV: 90.7 fL (ref 80.0–100.0)
MONO ABS: 900 {cells}/uL (ref 200–950)
MPV: 10.1 fL (ref 7.5–12.5)
Monocytes Relative: 9 %
NEUTROS ABS: 6000 {cells}/uL (ref 1500–7800)
Neutrophils Relative %: 60 %
Platelets: 345 10*3/uL (ref 140–400)
RBC: 4.31 MIL/uL (ref 3.80–5.10)
RDW: 13.2 % (ref 11.0–15.0)
WBC: 10 10*3/uL (ref 3.8–10.8)

## 2015-07-03 MED ORDER — ONDANSETRON 8 MG PO TBDP: ORAL_TABLET | ORAL | Status: DC

## 2015-07-03 MED ORDER — DIAZEPAM 2 MG PO TABS: 2.0000 mg | ORAL_TABLET | Freq: Three times a day (TID) | ORAL | Status: DC | PRN

## 2015-07-03 NOTE — Progress Notes (Signed)
Subjective:    Patient ID: Nicole Phelps, female    DOB: 1960-07-20, 55 y.o.   MRN: [id number]  Emesis  Associated symptoms include abdominal pain and dizziness. Pertinent negatives include no chills, diarrhea or fever.   Patient presents to the office for emesis and dizziness.  She reports that for the past year she has had increasing nausea and emesis.  She reports that she has been having the sensation of spinning and being off kilter which does increase nausea.  She did feel like she was going to vomit and fell on Friday while on her way to the bathroom.  She notes that when she fell she broke her ankle in two places.  She is going to have surgery on Wednesday by Dr. Berenice Primas.  She also notes epigastric abdominal pain. This seems to be better with lying down.  She is having a lot of reflux and also a lot of water brash.  She has not been on any antinausea medications.  She has been having normal bowel movements until being started on oxycodone for her ankle fracture.  No coffee ground emesis, no hematemesis, no melena, no hematochezia.  No prior surgeries to stomach.  NO family history of gallbladder issues.      Review of Systems  Constitutional: Negative for fever, chills and fatigue.  Respiratory: Negative for chest tightness and shortness of breath.   Gastrointestinal: Positive for nausea, vomiting and abdominal pain. Negative for diarrhea, constipation, blood in stool and abdominal distention.       Belching   Neurological: Positive for dizziness and light-headedness. Negative for tremors and weakness.       Objective:   Physical Exam  Constitutional: She is oriented to person, place, and time. She appears well-developed and well-nourished. No distress.  HENT:  Head: Normocephalic.  Mouth/Throat: Oropharynx is clear and moist. No oropharyngeal exudate.  Eyes: Conjunctivae are normal. No scleral icterus.  Neck: Normal range of motion. Neck supple. No JVD present. No thyromegaly  present.  Cardiovascular: Normal rate, regular rhythm, normal heart sounds and intact distal pulses.  Exam reveals no gallop and no friction rub.   No murmur heard. Pulmonary/Chest: Effort normal and breath sounds normal. No respiratory distress. She has no wheezes. She has no rales. She exhibits no tenderness.  Abdominal: Soft. Normal appearance and bowel sounds are normal. She exhibits no distension and no mass. There is tenderness in the right upper quadrant and epigastric area. There is no rigidity, no rebound, no guarding, no CVA tenderness, no tenderness at McBurney's point and negative Murphy's sign.  Musculoskeletal: Normal range of motion.  Lymphadenopathy:    She has no cervical adenopathy.  Neurological: She is alert and oriented to person, place, and time.  Skin: Skin is warm and dry. She is not diaphoretic.  Psychiatric: She has a normal mood and affect. Her behavior is normal. Judgment and thought content normal.  Nursing note and vitals reviewed.   Filed Vitals:   07/03/15 1614  BP: 144/86  Pulse: 78  Temp: 97.8 F (36.6 C)  Resp: 18          Assessment & Plan:  Nausea possibly multifactorial coming from either vertigo or from abdominal pain.  DDX for abdominal pain includes severe GERD, PUD, pancreatitis, cholelithiasis.  Will get labs and will get ultrasound.  Will treat symptomatically until then  1. Vertigo  - diazepam (VALIUM) 2 MG tablet; Take 1 tablet (2 mg total) by mouth every 8 (eight) hours as  needed (dizziness).  Dispense: 60 tablet; Refill: 0 - ondansetron (ZOFRAN ODT) 8 MG disintegrating tablet; 8mg  ODT q4 hours prn nausea  Dispense: 60 tablet; Refill: 0 - Ambulatory referral to ENT   2. Medication management   3. Epigastric pain  - CBC with Differential/Platelet - COMPLETE METABOLIC PANEL WITH GFR - Lipase - US Abdomen Complete; Future

## 2015-07-04 LAB — COMPLETE METABOLIC PANEL WITH GFR
ALBUMIN: 4.1 g/dL (ref 3.6–5.1)
ALK PHOS: 88 U/L (ref 33–130)
ALT: 13 U/L (ref 6–29)
AST: 14 U/L (ref 10–35)
BUN: 16 mg/dL (ref 7–25)
CALCIUM: 9.3 mg/dL (ref 8.6–10.4)
CHLORIDE: 103 mmol/L (ref 98–110)
CO2: 27 mmol/L (ref 20–31)
CREATININE: 0.68 mg/dL (ref 0.50–1.05)
GFR, Est African American: >89 mL/min (ref >=60)
GFR, Est Non African American: >89 mL/min (ref >=60)
GLUCOSE: 98 mg/dL (ref 65–99)
Potassium: 4 mmol/L (ref 3.5–5.3)
SODIUM: 141 mmol/L (ref 135–146)
Total Bilirubin: 0.5 mg/dL (ref 0.2–1.2)
Total Protein: 6.1 g/dL (ref 6.1–8.1)

## 2015-07-04 LAB — LIPASE: Lipase: 35 U/L (ref 7–60)

## 2015-07-06 ENCOUNTER — Ambulatory Visit (HOSPITAL_COMMUNITY): Payer: 59

## 2015-07-10 ENCOUNTER — Ambulatory Visit
Admission: RE | Admit: 2015-07-10 | Discharge: 2015-07-10 | Disposition: A | Discharge status: Home / Self Care | Payer: 59 | Source: Ambulatory Visit | Attending: Internal Medicine | Admitting: Internal Medicine

## 2015-07-10 ENCOUNTER — Other Ambulatory Visit: Payer: 59

## 2015-07-10 ENCOUNTER — Other Ambulatory Visit: Payer: Self-pay | Admitting: Internal Medicine

## 2015-07-10 DIAGNOSIS — R1013 Epigastric pain: Secondary | ICD-10-CM

## 2015-07-10 MED ORDER — PANTOPRAZOLE SODIUM 40 MG PO TBEC: 40.0000 mg | DELAYED_RELEASE_TABLET | Freq: Every day | ORAL | Status: DC

## 2015-08-01 ENCOUNTER — Other Ambulatory Visit: Payer: Self-pay | Admitting: Internal Medicine

## 2015-08-30 ENCOUNTER — Other Ambulatory Visit: Payer: Self-pay | Admitting: *Deleted

## 2015-09-13 ENCOUNTER — Other Ambulatory Visit: Payer: Self-pay | Admitting: *Deleted

## 2015-09-13 MED ORDER — PANTOPRAZOLE SODIUM 40 MG PO TBEC: 40.0000 mg | DELAYED_RELEASE_TABLET | Freq: Every day | ORAL | Status: DC

## 2015-10-26 ENCOUNTER — Other Ambulatory Visit: Payer: Self-pay | Admitting: *Deleted

## 2015-10-28 ENCOUNTER — Other Ambulatory Visit: Payer: Self-pay | Admitting: Internal Medicine

## 2015-10-28 MED ORDER — ESCITALOPRAM OXALATE 20 MG PO TABS: 20.0000 mg | ORAL_TABLET | Freq: Every day | ORAL | 1 refills | Status: DC

## 2015-11-18 ENCOUNTER — Other Ambulatory Visit: Payer: Self-pay | Admitting: Internal Medicine

## 2015-11-22 ENCOUNTER — Ambulatory Visit (INDEPENDENT_AMBULATORY_CARE_PROVIDER_SITE_OTHER): Payer: 59 | Admitting: Physician Assistant

## 2015-11-22 ENCOUNTER — Encounter: Payer: Self-pay | Admitting: Physician Assistant

## 2015-11-22 VITALS — BP 136/74 | HR 96 | Temp 97.3°F | Resp 16 | Ht 66.5 in | Wt 184.0 lb

## 2015-11-22 DIAGNOSIS — Z136 Encounter for screening for cardiovascular disorders: Secondary | ICD-10-CM | POA: Diagnosis not present

## 2015-11-22 DIAGNOSIS — Z0000 Encounter for general adult medical examination without abnormal findings: Secondary | ICD-10-CM

## 2015-11-22 DIAGNOSIS — J4520 Mild intermittent asthma, uncomplicated: Secondary | ICD-10-CM

## 2015-11-22 DIAGNOSIS — J45909 Unspecified asthma, uncomplicated: Secondary | ICD-10-CM

## 2015-11-22 DIAGNOSIS — I10 Essential (primary) hypertension: Secondary | ICD-10-CM | POA: Diagnosis not present

## 2015-11-22 DIAGNOSIS — E785 Hyperlipidemia, unspecified: Secondary | ICD-10-CM

## 2015-11-22 DIAGNOSIS — Z79899 Other long term (current) drug therapy: Secondary | ICD-10-CM

## 2015-11-22 DIAGNOSIS — F329 Major depressive disorder, single episode, unspecified: Secondary | ICD-10-CM

## 2015-11-22 DIAGNOSIS — N029 Recurrent and persistent hematuria with unspecified morphologic changes: Secondary | ICD-10-CM

## 2015-11-22 DIAGNOSIS — F17200 Nicotine dependence, unspecified, uncomplicated: Secondary | ICD-10-CM

## 2015-11-22 DIAGNOSIS — E559 Vitamin D deficiency, unspecified: Secondary | ICD-10-CM

## 2015-11-22 DIAGNOSIS — R42 Dizziness and giddiness: Secondary | ICD-10-CM

## 2015-11-22 DIAGNOSIS — F32A Depression, unspecified: Secondary | ICD-10-CM

## 2015-11-22 DIAGNOSIS — E538 Deficiency of other specified B group vitamins: Secondary | ICD-10-CM

## 2015-11-22 DIAGNOSIS — E2839 Other primary ovarian failure: Secondary | ICD-10-CM

## 2015-11-22 DIAGNOSIS — E669 Obesity, unspecified: Secondary | ICD-10-CM

## 2015-11-22 DIAGNOSIS — Z0001 Encounter for general adult medical examination with abnormal findings: Secondary | ICD-10-CM

## 2015-11-22 DIAGNOSIS — J449 Chronic obstructive pulmonary disease, unspecified: Secondary | ICD-10-CM

## 2015-11-22 LAB — HEPATIC FUNCTION PANEL
ALT: 16 U/L (ref 6–29)
AST: 16 U/L (ref 10–35)
Albumin: 4.4 g/dL (ref 3.6–5.1)
Alkaline Phosphatase: 100 U/L (ref 33–130)
BILIRUBIN DIRECT: 0.1 mg/dL (ref <=0.2)
BILIRUBIN TOTAL: 0.7 mg/dL (ref 0.2–1.2)
Indirect Bilirubin: 0.6 mg/dL (ref 0.2–1.2)
Total Protein: 6.7 g/dL (ref 6.1–8.1)

## 2015-11-22 LAB — MAGNESIUM: MAGNESIUM: 1.9 mg/dL (ref 1.5–2.5)

## 2015-11-22 LAB — CBC WITH DIFFERENTIAL/PLATELET
BASOS PCT: 0 %
Basophils Absolute: 0 cells/uL (ref 0–200)
EOS PCT: 1 %
Eosinophils Absolute: 100 cells/uL (ref 15–500)
HCT: 41.9 % (ref 35.0–45.0)
Hemoglobin: 14 g/dL (ref 11.7–15.5)
Lymphocytes Relative: 29 %
Lymphs Abs: 2900 cells/uL (ref 850–3900)
MCH: 29.9 pg (ref 27.0–33.0)
MCHC: 33.4 g/dL (ref 32.0–36.0)
MCV: 89.5 fL (ref 80.0–100.0)
MONOS PCT: 8 %
MPV: 10.4 fL (ref 7.5–12.5)
Monocytes Absolute: 800 cells/uL (ref 200–950)
NEUTROS ABS: 6200 {cells}/uL (ref 1500–7800)
Neutrophils Relative %: 62 %
PLATELETS: 323 10*3/uL (ref 140–400)
RBC: 4.68 MIL/uL (ref 3.80–5.10)
RDW: 13.7 % (ref 11.0–15.0)
WBC: 10 10*3/uL (ref 3.8–10.8)

## 2015-11-22 LAB — LIPID PANEL
Cholesterol: 234 mg/dL — ABNORMAL HIGH (ref 125–200)
HDL: 55 mg/dL (ref >=46)
LDL CALC: 149 mg/dL — AB (ref <130)
Total CHOL/HDL Ratio: 4.3 Ratio (ref <=5.0)
Triglycerides: 151 mg/dL — ABNORMAL HIGH (ref <150)
VLDL: 30 mg/dL (ref <30)

## 2015-11-22 LAB — BASIC METABOLIC PANEL WITH GFR
BUN: 12 mg/dL (ref 7–25)
CHLORIDE: 102 mmol/L (ref 98–110)
CO2: 25 mmol/L (ref 20–31)
CREATININE: 0.85 mg/dL (ref 0.50–1.05)
Calcium: 9.9 mg/dL (ref 8.6–10.4)
GFR, EST AFRICAN AMERICAN: 89 mL/min (ref >=60)
GFR, Est Non African American: 77 mL/min (ref >=60)
Glucose, Bld: 84 mg/dL (ref 65–99)
POTASSIUM: 4 mmol/L (ref 3.5–5.3)
SODIUM: 137 mmol/L (ref 135–146)

## 2015-11-22 MED ORDER — ALBUTEROL SULFATE HFA 108 (90 BASE) MCG/ACT IN AERS: INHALATION_SPRAY | RESPIRATORY_TRACT | 2 refills | Status: DC

## 2015-11-22 MED ORDER — MONTELUKAST SODIUM 10 MG PO TABS: ORAL_TABLET | ORAL | 2 refills | Status: DC

## 2015-11-22 MED ORDER — FLUTICASONE-SALMETEROL 100-50 MCG/DOSE IN AEPB: INHALATION_SPRAY | RESPIRATORY_TRACT | 1 refills | Status: DC

## 2015-11-22 MED ORDER — DIAZEPAM 2 MG PO TABS: 2.0000 mg | ORAL_TABLET | Freq: Three times a day (TID) | ORAL | 0 refills | Status: DC | PRN

## 2015-11-22 MED ORDER — PANTOPRAZOLE SODIUM 40 MG PO TBEC: 40.0000 mg | DELAYED_RELEASE_TABLET | Freq: Every day | ORAL | 1 refills | Status: DC

## 2015-11-22 NOTE — Progress Notes (Signed)
Complete Physical  Assessment and Plan: 1. Essential hypertension - continue medications, DASH diet, exercise and monitor at home. Call if greater than 130/80.  - CBC with Differential/Platelet - BASIC METABOLIC PANEL WITH GFR - Hepatic function panel - TSH - Urinalysis, Routine w reflex microscopic (not at [facility]) - Microalbumin / creatinine urine ratio - EKG 12-Lead  2. Hyperlipidemia -continue medications, check lipids, decrease fatty foods, increase activity.  - Lipid panel  3. Obesity Obesity with co morbidities- long discussion about weight loss, diet, and exercise  4. Vitamin D deficiency - Vit D  25 hydroxy (rtn osteoporosis monitoring)  5. Depression Depression- continue medications, stress management techniques discussed, increase water, good sleep hygiene discussed, increase exercise, and increase veggies.  - escitalopram (LEXAPRO) 20 MG tablet; Take 1 tablet by mouth  daily  Dispense: 90 tablet; Refill: 1 - ALPRAZolam (XANAX) 1 MG tablet; Take 1 tablet (1 mg total) by mouth daily.  Dispense: 90 tablet; Refill: 0  6. Smoker Smoking cessation-  instruction/counseling given, counseled patient on the dangers of tobacco use, advised patient to stop smoking, and reviewed strategies to maximize success, patient not ready to quit at this time.  - DG Chest 2 View; Future  7. Routine general medical examination at a health care facility - Magnesium  8. Estrogen deficiency Monitor DEXA  9. COPD Advised to stop smoking, will get CXR, continue meds.   10. Benign hematuria Check urine   Discussed med's effects and SE's. Screening labs and tests as requested with regular follow-up as recommended.  HPI 55 y.o. female  presents for a complete physical. Her blood pressure has been controlled at home, today their BP is BP: 136/74 She does workout, walks daily.  She denies chest pain, shortness of breath, dizziness.  She has been stressed, her grand baby, from [place],  is in the hospital.  She is not on cholesterol medication and denies myalgias. Her cholesterol is not at goal. The cholesterol last visit was:   Lab Results  Component Value Date   CHOL 193 05/18/2015   HDL 56 05/18/2015   LDLCALC 119 05/18/2015   TRIG 88 05/18/2015   CHOLHDL 3.4 05/18/2015   Last A1C in the office was:  Lab Results  Component Value Date   HGBA1C 5.5 05/18/2015   Has COPD and uses Advair and albuterol. She continues to smoke but states when she starts watching the baby she will stop, last CXR 09/27/2013 with bilateral lung thickening and mild cardiomegaly.  She is on advair does daily and has albuterol inhaler that she has used some with allergies. She is on singulair, claritin, nasal spray.  She is on Lexapro and xanax for anxiety/depression but took valium for vertigo and states it makes her less drowsy and she would like to stay on that .  B12 was  Lab Results  Component Value Date   VITAMINB12 410 09/16/2014   Patient is on Vitamin D supplement, 5000IU   Lab Results  Component Value Date   VD25OH 40 09/16/2014   BMI is Body mass index is 29.25 kg/m., she is working on diet and exercise. Wt Readings from Last 3 Encounters:  11/22/15 184 lb (83.5 kg)  09/16/14 181 lb (82.1 kg)  09/14/13 185 lb (83.9 kg)     Current Medications:  Current Outpatient Prescriptions on File Prior to Visit  Medication Sig Dispense Refill  . ADVAIR DISKUS 100-50 MCG/DOSE AEPB Use 1 inhalation twice  daily 180 each 1  . albuterol (VENTOLIN  HFA) 108 (90 BASE) MCG/ACT inhaler Use 2 puffs every 6 hours  as needed for shortness of  breath 72 g 2  . ALPRAZolam (XANAX) 1 MG tablet Take 1 tablet (1 mg total) by mouth daily. 90 tablet 0  . azelastine (ASTELIN) 0.1 % nasal spray Place 2 sprays into both nostrils 2 (two) times daily. Use in each nostril as directed 30 mL 2  . cetirizine (ZYRTEC) 10 MG tablet Take 10 mg by mouth daily.    . cholecalciferol (VITAMIN D) 1000 UNITS tablet  Take 2,000 Units by mouth daily.     . diazepam (VALIUM) 2 MG tablet Take 1 tablet (2 mg total) by mouth every 8 (eight) hours as needed (dizziness). 60 tablet 0  . escitalopram (LEXAPRO) 20 MG tablet Take 1 tablet (20 mg total) by mouth daily. 90 tablet 1  . fluticasone (FLONASE) 50 MCG/ACT nasal spray Place 1 spray into both nostrils daily. 16 g 2  . montelukast (SINGULAIR) 10 MG tablet Take 1 tablet by mouth  daily 90 tablet 2  . pantoprazole (PROTONIX) 40 MG tablet TAKE 1 TABLET BY MOUTH EVERY DAY 30 tablet 1   No current facility-administered medications on file prior to visit.    Health Maintenance:   Immunization History  Administered Date(s) Administered  . Pneumococcal-Unspecified 04/03/2009  . Td 04/03/2004   TDAP: 09/2013 at pharmacy Pneumovax: 2011 Prevnar 13: due at age 89 Flu vaccine: 2015 at pharmacy Zostavax: N/A  Pap: 2016 negative MGM: 09/2014 had it this morning DEXA: 09/2013 normal Colonoscopy: 08/2012 polyps due 5 years 2019 EGD: N/A CXR 09/2013 stable cardiomegaly US pelvis 2011 DEE Dr. Sherlean Foot 3 years ago, reading glasses Dentist: Barney Drain, q 6 months  Medical History:  Past Medical History:  Diagnosis Date  . Allergy   . Arthritis   . Asthma   . Benign hematuria 2004  . Depression   . Hyperlipidemia   . Hypertension   . TMJ (temporomandibular joint syndrome)   . Vitamin D deficiency    Allergies Allergies  Allergen Reactions  . Celexa [Citalopram Hydrobromide]     Myalgias  . Trintellix [Vortioxetine] Other (See Comments)    Dysphoria   . Vicodin [Hydrocodone-Acetaminophen] Nausea And Vomiting  . Wellbutrin [Bupropion]     anxiety   SURGICAL HISTORY She  has a past surgical history that includes Abdominal hysterectomy; Tubal ligation; and Breast surgery (Left). FAMILY HISTORY Her family history includes Arthritis in her mother; Cancer in her mother; Hypertension in her father; Stroke in her father. SOCIAL HISTORY She  reports that  she has been smoking.  She has a 12.50 pack-year smoking history. She has never used smokeless tobacco. She reports that she drinks alcohol. She reports that she uses drugs, including Marijuana.  Review of Systems  Constitutional: Positive for malaise/fatigue. Negative for chills, diaphoresis, fever and weight loss.  HENT: Negative for congestion, ear discharge, ear pain, hearing loss, nosebleeds, sore throat and tinnitus.   Eyes: Negative.   Respiratory: Negative for cough, hemoptysis, sputum production, shortness of breath, wheezing and stridor.   Cardiovascular: Negative.   Gastrointestinal: Negative for abdominal pain, blood in stool, constipation, diarrhea, heartburn, melena, nausea and vomiting.  Genitourinary: Negative for dysuria, flank pain, frequency, hematuria and urgency.  Musculoskeletal: Negative.   Skin: Negative.   Neurological: Negative.  Negative for weakness and headaches.  Psychiatric/Behavioral: Negative for depression, hallucinations, memory loss, substance abuse and suicidal ideas. The patient has insomnia. The patient is not nervous/anxious.    Physical  Exam: Estimated body mass index is 29.25 kg/m as calculated from the following:   Height as of this encounter: 5' 6.5" (1.689 m).   Weight as of this encounter: 184 lb (83.5 kg). BP 136/74   Pulse 96   Temp 97.3 F (36.3 C)   Resp 16   Ht 5' 6.5" (1.689 m)   Wt 184 lb (83.5 kg)   SpO2 97%   BMI 29.25 kg/m  General Appearance: Well nourished, in no apparent distress. Eyes: PERRLA, EOMs, conjunctiva no swelling or erythema, normal fundi and vessels. Sinuses: No Frontal/maxillary tenderness ENT/Mouth: Ext aud canals clear, normal light reflex with TMs without erythema, bulging.  Good dentition. No erythema, swelling, or exudate on post pharynx. Tonsils not swollen or erythematous. Hearing normal. Crowded mouth.  Neck: Supple, thyroid normal. No bruits Respiratory: Respiratory effort normal, BS equal bilaterally  without rales, rhonchi, wheezing or stridor. Cardio: RRR without murmurs, rubs or gallops. Brisk peripheral pulses without edema.  Chest: symmetric, with normal excursions and percussion. Breasts: Symmetric, without lumps, nipple discharge, or retractions. Abdomen: Soft, +BS. nontender no guarding, rebound, hernias, masses, or organomegaly. .  Lymphatics: Non tender without lymphadenopathy.  Gen: defer Musculoskeletal: Full ROM all peripheral extremities,5/5 strength, and normal gait, well healing left ankle scars.  Skin: Warm, dry without rashes, lesions, ecchymosis.  Neuro: Cranial nerves intact, reflexes equal bilaterally. Normal muscle tone, no cerebellar symptoms. Sensation intact.  Psych: Awake and oriented X 3, normal affect, Insight and Judgment appropriate.   EKG: WNL no changes. AORTA SCAN: defer   Vicie Mutters 3:20 PM

## 2015-11-23 LAB — TSH: TSH: 0.97 m[IU]/L

## 2015-11-23 LAB — VITAMIN D 25 HYDROXY (VIT D DEFICIENCY, FRACTURES): VIT D 25 HYDROXY: 31 ng/mL (ref 30–100)

## 2015-11-29 ENCOUNTER — Telehealth: Payer: Self-pay | Admitting: Physician Assistant

## 2015-11-29 ENCOUNTER — Other Ambulatory Visit: Payer: Self-pay | Admitting: Physician Assistant

## 2015-11-29 ENCOUNTER — Telehealth: Payer: Self-pay

## 2015-11-29 MED ORDER — BENZONATATE 100 MG PO CAPS: 200.0000 mg | ORAL_CAPSULE | Freq: Three times a day (TID) | ORAL | 0 refills | Status: DC | PRN

## 2015-11-29 MED ORDER — LEVOFLOXACIN 500 MG PO TABS: 500.0000 mg | ORAL_TABLET | Freq: Every day | ORAL | 0 refills | Status: DC

## 2015-11-29 MED ORDER — PREDNISONE 20 MG PO TABS: ORAL_TABLET | ORAL | 0 refills | Status: DC

## 2015-11-29 NOTE — Telephone Encounter (Signed)
Patient is smoker with COPD, has had productive cough for 5 days with clear thick sputum, head congestion.   Continue advair twice daily and albuterol as needed. Advised to quit smoking. Will send in levaquin and predniosone and tessalon.  If not better needs OV or if gets worse go to Er.

## 2015-11-29 NOTE — Telephone Encounter (Signed)
Pt was informed of Rx that was sent into pharmacy. Pt agreed & understood that if she didn't get better soon to make a OV or if worse go to ER.

## 2016-01-23 ENCOUNTER — Other Ambulatory Visit: Payer: Self-pay | Admitting: Internal Medicine

## 2016-01-30 ENCOUNTER — Other Ambulatory Visit: Payer: Self-pay | Admitting: Internal Medicine

## 2016-02-23 ENCOUNTER — Ambulatory Visit (INDEPENDENT_AMBULATORY_CARE_PROVIDER_SITE_OTHER): Payer: 59 | Admitting: Internal Medicine

## 2016-02-23 ENCOUNTER — Encounter: Payer: Self-pay | Admitting: Internal Medicine

## 2016-02-23 VITALS — BP 128/66 | HR 70 | Temp 98.0°F | Resp 16 | Ht 66.5 in | Wt 182.0 lb

## 2016-02-23 DIAGNOSIS — J069 Acute upper respiratory infection, unspecified: Secondary | ICD-10-CM | POA: Diagnosis not present

## 2016-02-23 MED ORDER — AZELASTINE HCL 0.1 % NA SOLN: 2.0000 | Freq: Two times a day (BID) | NASAL | 2 refills | Status: AC

## 2016-02-23 MED ORDER — DOXYCYCLINE HYCLATE 100 MG PO CAPS: 100.0000 mg | ORAL_CAPSULE | Freq: Two times a day (BID) | ORAL | 0 refills | Status: DC

## 2016-02-23 MED ORDER — PREDNISONE 20 MG PO TABS: ORAL_TABLET | ORAL | 0 refills | Status: DC

## 2016-02-23 MED ORDER — BENZONATATE 100 MG PO CAPS: 100.0000 mg | ORAL_CAPSULE | Freq: Four times a day (QID) | ORAL | 1 refills | Status: AC | PRN

## 2016-02-23 MED ORDER — FLUTICASONE PROPIONATE 50 MCG/ACT NA SUSP: 1.0000 | Freq: Every day | NASAL | 2 refills | Status: DC

## 2016-02-23 MED ORDER — PROMETHAZINE-PHENYLEPHRINE 6.25-5 MG/5ML PO SYRP: 10.0000 mL | ORAL_SOLUTION | Freq: Four times a day (QID) | ORAL | 0 refills | Status: DC | PRN

## 2016-02-23 NOTE — Progress Notes (Signed)
HPI  Patient presents to the office for evaluation of cough and sinus pressure.  It has been going on for 3 weeks.  Patient reports night > day, wet, barky, worse with lying down, green sputum production.  They also endorse change in voice, chills, fever, postnasal drip, shortness of breath, sputum production and nasal congestion, ear pain, sore throat, headaches, body aches. .  They have tried mucinex and tessalon perles.  They report that nothing has worked.  They denies other sick contacts.  Review of Systems  Constitutional: Positive for malaise/fatigue. Negative for chills and fever.  HENT: Positive for congestion, ear pain, hearing loss and sore throat.   Respiratory: Positive for cough. Negative for sputum production, shortness of breath and wheezing.   Cardiovascular: Negative for chest pain, palpitations and leg swelling.  Neurological: Positive for headaches.    PE:  Vitals:   02/23/16 0916  BP: 128/66  Pulse: 70  Resp: 16  Temp: 98 F (36.7 C)   General:  Alert and non-toxic, WDWN, NAD HEENT: NCAT, PERLA, EOM normal, no occular discharge or erythema.  Nasal mucosal edema with sinus tenderness to palpation.  Oropharynx clear with minimal oropharyngeal edema and erythema.  Mucous membranes moist and pink. Neck:  Cervical adenopathy Chest:  RRR no MRGs.  Lungs clear to auscultation A&P with no wheezes rhonchi or rales.   Abdomen: +BS x 4 quadrants, soft, non-tender, no guarding, rigidity, or rebound. Skin: warm and dry no rash Neuro: A&Ox4, CN II-XII grossly intact  Assessment and Plan:   1. Acute URI -promethazine VC -predniosne -cont advair -cont albuterol q6hrs -doxycycline -zyrtec -benadryl at bedtime -flonase -astelin

## 2016-03-15 ENCOUNTER — Encounter: Payer: Self-pay | Admitting: *Deleted

## 2016-03-20 ENCOUNTER — Other Ambulatory Visit: Payer: Self-pay | Admitting: Internal Medicine

## 2016-05-21 ENCOUNTER — Ambulatory Visit: Payer: Self-pay | Admitting: Physician Assistant

## 2016-05-21 NOTE — Progress Notes (Deleted)
Assessment and Plan:  Hypertension:  -Continue medication,  -monitor blood pressure at home.  -Continue DASH diet.   -Reminder to go to the ER if any CP, SOB, nausea, dizziness, severe HA, changes vision/speech, left arm numbness and tingling, and jaw pain.  Cholesterol: -Continue diet and exercise.  -Check cholesterol.   Pre-diabetes: -Continue diet and exercise.  -Check A1C  Vitamin D Def: -check level -continue medications.   Continue diet and meds as discussed. Further disposition pending results of labs.  HPI 56 y.o. female  presents for 3 month follow up with hypertension, hyperlipidemia, prediabetes and vitamin D.   Her blood pressure has been controlled at home, today their BP is  .   She does not workout. She denies chest pain, shortness of breath, dizziness.   She is on cholesterol medication and denies myalgias. Her cholesterol is at goal. The cholesterol last visit was:   Lab Results  Component Value Date   CHOL 234 (H) 11/22/2015   HDL 55 11/22/2015   LDLCALC 149 (H) 11/22/2015   TRIG 151 (H) 11/22/2015   CHOLHDL 4.3 11/22/2015    She has been working on diet and exercise for prediabetes, and denies foot ulcerations, hyperglycemia, hypoglycemia , increased appetite, nausea, paresthesia of the feet, polydipsia, polyuria, visual disturbances, vomiting and weight loss. Last A1C in the office was:  Lab Results  Component Value Date   HGBA1C 5.5 05/18/2015    Patient is on Vitamin D supplement.  Lab Results  Component Value Date   VD25OH 31 11/22/2015     BMI is There is no height or weight on file to calculate BMI., she is working on diet and exercise. Wt Readings from Last 3 Encounters:  02/23/16 182 lb (82.6 kg)  11/22/15 184 lb (83.5 kg)  09/16/14 181 lb (82.1 kg)      Current Medications:  Current Outpatient Prescriptions on File Prior to Visit  Medication Sig Dispense Refill  . albuterol (VENTOLIN HFA) 108 (90 Base) MCG/ACT inhaler Use 2 puffs  every 6 hours  as needed for shortness of  breath 72 g 2  . ALPRAZolam (XANAX) 1 MG tablet Take 1 tablet (1 mg total) by mouth daily. 90 tablet 0  . azelastine (ASTELIN) 0.1 % nasal spray Place 2 sprays into both nostrils 2 (two) times daily. Use in each nostril as directed 30 mL 2  . benzonatate (TESSALON PERLES) 100 MG capsule Take 1 capsule (100 mg total) by mouth every 6 (six) hours as needed for cough. 30 capsule 1  . cetirizine (ZYRTEC) 10 MG tablet Take 10 mg by mouth daily.    . cholecalciferol (VITAMIN D) 1000 UNITS tablet Take 2,000 Units by mouth daily.     . diazepam (VALIUM) 2 MG tablet Take 1 tablet (2 mg total) by mouth every 8 (eight) hours as needed for anxiety (dizziness). 180 tablet 0  . doxycycline (VIBRAMYCIN) 100 MG capsule Take 1 capsule (100 mg total) by mouth 2 (two) times daily. One po bid x 7 days 14 capsule 0  . escitalopram (LEXAPRO) 20 MG tablet TAKE 1 TABLET BY MOUTH  DAILY 90 tablet 0  . fluticasone (FLONASE) 50 MCG/ACT nasal spray Place 1 spray into both nostrils daily. 16 g 2  . Fluticasone-Salmeterol (ADVAIR DISKUS) 100-50 MCG/DOSE AEPB Use 1 inhalation twice  daily 180 each 1  . pantoprazole (PROTONIX) 40 MG tablet TAKE 1 TABLET BY MOUTH EVERY DAY 90 tablet 1  . predniSONE (DELTASONE) 20 MG tablet 2 tablets daily  for 3 days, 1 tablet daily for 4 days. 10 tablet 0  . promethazine-phenylephrine (PROMETHAZINE VC) 6.25-5 MG/5ML SYRP Take 10 mLs by mouth every 6 (six) hours as needed for congestion. 280 mL 0   No current facility-administered medications on file prior to visit.     Medical History:  Past Medical History:  Diagnosis Date  . Allergy   . Arthritis   . Asthma   . Benign hematuria 2004  . Depression   . Hyperlipidemia   . Hypertension   . TMJ (temporomandibular joint syndrome)   . Vitamin D deficiency     Allergies:  Allergies  Allergen Reactions  . Celexa [Citalopram Hydrobromide]     Myalgias  . Trintellix [Vortioxetine] Other (See  Comments)    Dysphoria   . Vicodin [Hydrocodone-Acetaminophen] Nausea And Vomiting  . Wellbutrin [Bupropion]     anxiety     Review of Systems:  Review of Systems  Constitutional: Negative for chills, fever and malaise/fatigue.  HENT: Negative for congestion, ear pain and sore throat.   Respiratory: Negative for cough, shortness of breath and wheezing.   Cardiovascular: Negative for chest pain, palpitations and leg swelling.  Gastrointestinal: Negative for blood in stool, constipation, diarrhea, heartburn and melena.  Genitourinary: Negative.   Musculoskeletal: Positive for myalgias.  Neurological: Negative for dizziness, sensory change, loss of consciousness and headaches.  Psychiatric/Behavioral: Negative for depression. The patient has insomnia (secondary to night sweats.). The patient is not nervous/anxious.     Family history- Review and unchanged  Social history- Review and unchanged  Physical Exam: There were no vitals taken for this visit. Wt Readings from Last 3 Encounters:  02/23/16 182 lb (82.6 kg)  11/22/15 184 lb (83.5 kg)  09/16/14 181 lb (82.1 kg)    General Appearance: Well nourished well developed, in no apparent distress. Eyes: PERRLA, EOMs, conjunctiva no swelling or erythema ENT/Mouth: Ear canals normal without obstruction, swelling, erythma, discharge.  TMs normal bilaterally.  Oropharynx moist, clear, without exudate, or postoropharyngeal swelling. Neck: Supple, thyroid normal,no cervical adenopathy  Respiratory: Respiratory effort normal, Breath sounds clear A&P without rhonchi, wheeze, or rale.  No retractions, no accessory usage. Cardio: RRR with no MRGs. Brisk peripheral pulses without edema.  Abdomen: Soft, + BS,  Non tender, no guarding, rebound, hernias, masses. Musculoskeletal: Full ROM, 5/5 strength, Normal gait Skin: Warm, dry without rashes, lesions, ecchymosis.  Neuro: Awake and oriented X 3, Cranial nerves intact. Normal muscle tone, no  cerebellar symptoms. Psych: Normal affect, Insight and Judgment appropriate.    Vicie Mutters, PA-C 10:03 AM [facility]

## 2016-06-06 ENCOUNTER — Other Ambulatory Visit: Payer: Self-pay | Admitting: Internal Medicine

## 2016-06-10 ENCOUNTER — Other Ambulatory Visit: Payer: Self-pay | Admitting: Internal Medicine

## 2016-06-10 MED ORDER — AZITHROMYCIN 250 MG PO TABS: ORAL_TABLET | ORAL | 0 refills | Status: DC

## 2016-06-10 MED ORDER — PREDNISONE 20 MG PO TABS: ORAL_TABLET | ORAL | 0 refills | Status: DC

## 2016-06-12 ENCOUNTER — Ambulatory Visit (INDEPENDENT_AMBULATORY_CARE_PROVIDER_SITE_OTHER): Payer: 59 | Admitting: Internal Medicine

## 2016-06-12 ENCOUNTER — Encounter: Payer: Self-pay | Admitting: Internal Medicine

## 2016-06-12 VITALS — BP 144/62 | HR 76 | Temp 98.2°F | Resp 16 | Ht 66.5 in | Wt 188.0 lb

## 2016-06-12 DIAGNOSIS — J069 Acute upper respiratory infection, unspecified: Secondary | ICD-10-CM | POA: Diagnosis not present

## 2016-06-12 MED ORDER — OFLOXACIN 0.3 % OT SOLN: 5.0000 [drp] | Freq: Two times a day (BID) | OTIC | 0 refills | Status: DC

## 2016-06-12 MED ORDER — DOXYCYCLINE HYCLATE 100 MG PO CAPS: 100.0000 mg | ORAL_CAPSULE | Freq: Two times a day (BID) | ORAL | 0 refills | Status: DC

## 2016-06-12 MED ORDER — TRIAMCINOLONE ACETONIDE 0.1 % EX CREA: 1.0000 "application " | TOPICAL_CREAM | Freq: Three times a day (TID) | CUTANEOUS | 0 refills | Status: DC

## 2016-06-12 MED ORDER — ESCITALOPRAM OXALATE 20 MG PO TABS: 20.0000 mg | ORAL_TABLET | Freq: Every day | ORAL | 3 refills | Status: DC

## 2016-06-12 NOTE — Patient Instructions (Signed)
Please stop the zpak.  Please continue to take the prednisone  Tablets daily with your breakfast in the morning.  Please start taking doxycycline twice daily with food.  Please continue to take advair twice daily.  Please use your albuterol inhaler every 6 hours.  Please keep taking both the flonase and the claritin daily.  Please take benadryl every 8 hours for rash and to help dry up congestion.  Take zantac or pepcid twice daily to help clear rash sooner.  Please use ofloxacin ear drops twice daily.   Please do not submerge your ear in water.  Use foam plugs when you shower.

## 2016-06-12 NOTE — Progress Notes (Signed)
HPI  Patient presents to the office for evaluation of cough.  It has been going on for 1 days.  Patient reports night > day, dry, barky, worse with lying down.  They also endorse change in voice, postnasal drip, shortness of breath, wheezing and right ear pain and drainage out of the right ear.  .  They have tried antitussives, antibiotics, antihistamines or advair BID, and albuterol 4 times daily.  They report that nothing has worked.  They admits to other sick contacts.  She reports that she has never had problems with the zpaks before.  She has taken 3 doses of the azithromycin and then broke out.  She got hives   Review of Systems  Constitutional: Positive for malaise/fatigue. Negative for chills and fever.  HENT: Positive for congestion, ear discharge, ear pain, hearing loss and sore throat.   Respiratory: Positive for cough. Negative for sputum production, shortness of breath and wheezing.   Cardiovascular: Negative for chest pain, palpitations and leg swelling.  Neurological: Positive for headaches.    PE:  Vitals:   06/12/16 1632  BP: (!) 144/62  Pulse: 76  Resp: 16  Temp: 98.2 F (36.8 C)    General:  Alert and non-toxic, WDWN, NAD HEENT: NCAT, PERLA, EOM normal, no occular discharge or erythema.  Nasal mucosal edema with sinus tenderness to palpation.  Oropharynx clear with minimal oropharyngeal edema and erythema.  Mucous membranes moist and pink. Right TM ruptured.  Purulent discharge in the right ear canal.   Neck:  Cervical adenopathy Chest:  RRR no MRGs.  Lungs clear to auscultation A&P with no wheezes rhonchi or rales.   Abdomen: +BS x 4 quadrants, soft, non-tender, no guarding, rigidity, or rebound. Skin: warm and dry no rash Neuro: A&Ox4, CN II-XII grossly intact  Assessment and Plan:   1. Acute URI -stop zpak as this is likely source of allergy -rash does appear to be improving with benadryl -cont phenergan VC -cont flonase -cont astelin -cont benadryl at  bedtime -patient aware to not get ears wet, she will wear either cotton or she will do ear plugs to prevent ear drum being exposed to bacterial infections - doxycycline (VIBRAMYCIN) 100 MG capsule; Take 1 capsule (100 mg total) by mouth 2 (two) times daily. One po bid x 7 days  Dispense: 28 capsule; Refill: 0 - ofloxacin (FLOXIN) 0.3 % otic solution; Place 5 drops into the right ear 2 (two) times daily.  Dispense: 5 mL; Refill: 0 - triamcinolone cream (KENALOG) 0.1 %; Apply 1 application topically 3 (three) times daily. Place a thin layer over the affected areas  Dispense: 30 g; Refill: 0

## 2016-06-19 ENCOUNTER — Ambulatory Visit: Payer: Self-pay | Admitting: Physician Assistant

## 2016-06-25 ENCOUNTER — Other Ambulatory Visit: Payer: Self-pay | Admitting: Physician Assistant

## 2016-06-25 MED ORDER — FLUCONAZOLE 150 MG PO TABS: 150.0000 mg | ORAL_TABLET | Freq: Every day | ORAL | 3 refills | Status: DC

## 2016-07-18 ENCOUNTER — Other Ambulatory Visit: Payer: Self-pay | Admitting: Physician Assistant

## 2016-07-18 DIAGNOSIS — R42 Dizziness and giddiness: Secondary | ICD-10-CM

## 2016-07-18 MED ORDER — DIAZEPAM 2 MG PO TABS: 2.0000 mg | ORAL_TABLET | Freq: Three times a day (TID) | ORAL | 0 refills | Status: DC | PRN

## 2016-08-03 ENCOUNTER — Other Ambulatory Visit: Payer: Self-pay | Admitting: Physician Assistant

## 2016-08-03 DIAGNOSIS — J4520 Mild intermittent asthma, uncomplicated: Secondary | ICD-10-CM

## 2016-09-10 ENCOUNTER — Other Ambulatory Visit: Payer: Self-pay | Admitting: Physician Assistant

## 2016-09-10 ENCOUNTER — Other Ambulatory Visit: Payer: Self-pay | Admitting: Internal Medicine

## 2016-12-13 ENCOUNTER — Ambulatory Visit: Payer: Self-pay | Admitting: Physician Assistant

## 2016-12-22 DIAGNOSIS — Z23 Encounter for immunization: Secondary | ICD-10-CM | POA: Diagnosis not present

## 2017-01-16 ENCOUNTER — Encounter: Payer: Self-pay | Admitting: Physician Assistant

## 2017-01-16 ENCOUNTER — Ambulatory Visit (INDEPENDENT_AMBULATORY_CARE_PROVIDER_SITE_OTHER): Payer: 59 | Admitting: Physician Assistant

## 2017-01-16 VITALS — BP 122/86 | HR 75 | Temp 97.3°F | Resp 18 | Ht 66.5 in | Wt 179.8 lb

## 2017-01-16 DIAGNOSIS — M25571 Pain in right ankle and joints of right foot: Secondary | ICD-10-CM | POA: Diagnosis not present

## 2017-01-16 DIAGNOSIS — F17200 Nicotine dependence, unspecified, uncomplicated: Secondary | ICD-10-CM | POA: Diagnosis not present

## 2017-01-16 DIAGNOSIS — Z79899 Other long term (current) drug therapy: Secondary | ICD-10-CM

## 2017-01-16 DIAGNOSIS — E785 Hyperlipidemia, unspecified: Secondary | ICD-10-CM | POA: Diagnosis not present

## 2017-01-16 DIAGNOSIS — F3341 Major depressive disorder, recurrent, in partial remission: Secondary | ICD-10-CM | POA: Diagnosis not present

## 2017-01-16 DIAGNOSIS — I10 Essential (primary) hypertension: Secondary | ICD-10-CM

## 2017-01-16 LAB — HEPATIC FUNCTION PANEL
AG Ratio: 1.7 (calc) (ref 1.0–2.5)
ALT: 21 U/L (ref 6–29)
AST: 19 U/L (ref 10–35)
Albumin: 4.1 g/dL (ref 3.6–5.1)
Alkaline phosphatase (APISO): 110 U/L (ref 33–130)
Bilirubin, Direct: 0.1 mg/dL (ref 0.0–0.2)
Globulin: 2.4 g/dL (calc) (ref 1.9–3.7)
Indirect Bilirubin: 0.3 mg/dL (calc) (ref 0.2–1.2)
Total Bilirubin: 0.4 mg/dL (ref 0.2–1.2)
Total Protein: 6.5 g/dL (ref 6.1–8.1)

## 2017-01-16 LAB — BASIC METABOLIC PANEL WITH GFR
BUN: 15 mg/dL (ref 7–25)
CO2: 29 mmol/L (ref 20–32)
Calcium: 9.4 mg/dL (ref 8.6–10.4)
Chloride: 104 mmol/L (ref 98–110)
Creat: 0.86 mg/dL (ref 0.50–1.05)
GFR, Est African American: 88 mL/min/{1.73_m2} (ref >=60)
GFR, Est Non African American: 76 mL/min/{1.73_m2} (ref >=60)
Glucose, Bld: 85 mg/dL (ref 65–99)
Potassium: 4.4 mmol/L (ref 3.5–5.3)
Sodium: 139 mmol/L (ref 135–146)

## 2017-01-16 LAB — CBC WITH DIFFERENTIAL/PLATELET
BASOS ABS: 51 {cells}/uL (ref 0–200)
BASOS PCT: 0.6 %
EOS ABS: 179 {cells}/uL (ref 15–500)
Eosinophils Relative: 2.1 %
HCT: 40 % (ref 35.0–45.0)
HEMOGLOBIN: 13.7 g/dL (ref 11.7–15.5)
Lymphs Abs: 2618 cells/uL (ref 850–3900)
MCH: 30.3 pg (ref 27.0–33.0)
MCHC: 34.3 g/dL (ref 32.0–36.0)
MCV: 88.5 fL (ref 80.0–100.0)
MONOS PCT: 9.1 %
MPV: 10.5 fL (ref 7.5–12.5)
Neutro Abs: 4879 cells/uL (ref 1500–7800)
Neutrophils Relative %: 57.4 %
Platelets: 336 10*3/uL (ref 140–400)
RBC: 4.52 10*6/uL (ref 3.80–5.10)
RDW: 12.1 % (ref 11.0–15.0)
TOTAL LYMPHOCYTE: 30.8 %
WBC: 8.5 10*3/uL (ref 3.8–10.8)
WBCMIX: 774 {cells}/uL (ref 200–950)

## 2017-01-16 LAB — LIPID PANEL
Cholesterol: 215 mg/dL — ABNORMAL HIGH (ref <200)
HDL: 49 mg/dL — ABNORMAL LOW (ref >50)
LDL Cholesterol (Calc): 127 mg/dL (calc) — ABNORMAL HIGH
Non-HDL Cholesterol (Calc): 166 mg/dL (calc) — ABNORMAL HIGH (ref <130)
Total CHOL/HDL Ratio: 4.4 (calc) (ref <5.0)
Triglycerides: 253 mg/dL — ABNORMAL HIGH (ref <150)

## 2017-01-16 LAB — TSH: TSH: 3.33 mIU/L (ref 0.40–4.50)

## 2017-01-16 LAB — MAGNESIUM: Magnesium: 2.1 mg/dL (ref 1.5–2.5)

## 2017-01-16 MED ORDER — BUDESONIDE-FORMOTEROL FUMARATE 160-4.5 MCG/ACT IN AERO: 2.0000 | INHALATION_SPRAY | Freq: Two times a day (BID) | RESPIRATORY_TRACT | 4 refills | Status: DC

## 2017-01-16 MED ORDER — MELOXICAM 15 MG PO TABS: ORAL_TABLET | ORAL | 1 refills | Status: DC

## 2017-01-16 NOTE — Patient Instructions (Addendum)
Mobic is an antiinflammatory It helps pain, can not take with aleve, or ibuprofen You can take tylenol (500mg ) or tylenol arthritis (650mg ) with the meloxicam/antiinflammatories. The max you can take of tylenol a day is 3000mg  daily, this is a max of 6 pills a day of the regular tyelnol (500mg ) or a max of 4 a day of the tylenol arthritis (650mg ) as long as no other medications you are taking contain tylenol.   Mobic can cause inflammation in your stomach and can cause ulcers or bleeding, this will look like black tarry stools Make sure you take your mobic with food Try not to take it daily, take AS needed Can take with zantac  VAGINAL DRYNESS OVERVIEW  Vaginal dryness, also known as atrophic vaginitis, is a common condition in postmenopausal women. This condition is also common in women who have had both ovaries removed at the time of hysterectomy.   Some women have uncomfortable symptoms of vaginal dryness, such as pain with sex, burning vaginal discomfort or itching, or abnormal vaginal discharge, while others have no symptoms at all.  VAGINAL DRYNESS CAUSES   Estrogen helps to keep the vagina moist and to maintain thickness of the vaginal lining. Vaginal dryness occurs when the ovaries produce a decreased amount of estrogen. This can occur at certain times in a woman's life, and may be permanent or temporary. Times when less estrogen is made include: ?At the time of menopause. ?After surgical removal of the ovaries, chemotherapy, or radiation therapy of the pelvis for cancer. ?After having a baby, particularly in women who breastfeed. ?While using certain medications, such as danazol, medroxyprogesterone (brand names: Provera or DepoProvera), leuprolide (brand name: Lupron), or nafarelin. When these medications are stopped, estrogen production resumes.  Women who smoke cigarettes have been shown to have an increased risk of an earlier menopause transition as compared to non-smokers.  Therefore, atrophic vaginitis symptoms may appear at a younger age in this population.  VAGINAL DRYNESS TREATMENT   There are three treatment options for women with vaginal dryness:  Vaginal lubricants and moisturizers - Vaginal lubricants and moisturizers can be purchased without a prescription. These products do not contain any hormones and have virtually no side effects. - Albolene is found in the facial cleanser section at [facility], [facility], or [employer]. It is a large jar with a blue top. This is the best lubricant for women because it is hypoallergenic. -Natural lubricants, such as olive, avocado or peanut oil, are easily available products that may be used as a lubricant with sex.  -Vaginal moisturizes (eg, Replens, Moist Again, Vagisil, K-Y Silk-E, and Feminease) are formulated to allow water to be retained in the vaginal tissues. Moisturizers are applied into the vagina three times weekly to allow a continued moisturizing effect. These should not be used just before having sex, as they can be irritating.  Vaginal estrogen - Vaginal estrogen is the most effective treatment option for women with vaginal dryness. Vaginal estrogen must be prescribed by a healthcare provider. Very low doses of vaginal estrogen can be used when it is put into the vagina to treat vaginal dryness. A small amount of estrogen is absorbed into the bloodstream, but only about 100 times less than when using estrogen pills or tablets. As a result, there is a much lower risk of side effects, such as blood clots, breast cancer, and heart attack, compared with other estrogen-containing products (birth control pills, menopausal hormone therapy).   Ospemifene - Ospemifene is a prescription medication that is  similar to estrogen, but is not estrogen. In the vaginal tissue, it acts similarly to estrogen. In the breast tissue, it acts as an estrogen blocker. It comes in a pill, and is prescribed for women who want to use an estrogen-like  medication for vaginal dryness or painful sex associated with vaginal dryness, but prefer not to use a vaginal medication. The medication may cause hot flashes as a side effect. This type of medication may increase the risk of blood clots or uterine cancer. Further study of ospemifene is needed to evaluate the risk of these complications. This medication has not been tested in women who have had breast cancer or are at a high risk of developing breast cancer.    Sexual activity - Vaginal estrogen improves vaginal dryness quickly, usually within a few weeks. You may continue to have sex as you treat vaginal dryness because sex itself can help to keep the vaginal tissues healthy. Vaginal intercourse may help the vaginal tissues by keeping them soft and stretchable and preventing the tissues from shrinking.  If sex continues to be painful despite treatment for vaginal dryness, talk to your healthcare provider.    Dyspareunia, Female Dyspareunia is pain that is associated with sexual activity. This can affect any part of the genitals or lower abdomen, and there are many possible causes. This condition ranges from mild to severe. Depending on the cause, dyspareunia may get better with treatment, or it may return (recur) over time. What are the causes? The cause of this condition is not always known. Possible causes include:  Cancer.  Psychological factors, such as depression, anxiety, or previous traumatic experiences.  Severe pain and tenderness of the skin around the vagina (vulva) when it is touched (vulvar vestibulitis syndrome).  Infection of the pelvis or the vulva.  Infection of the vagina.  Painful, involuntary tightening (contraction) of the vaginal muscles when anything is put inside the vagina (vaginismus).  Allergic reaction.  Ovarian cysts.  Solid growths of tissue (tumors) in the ovaries or the uterus.  Scar tissue in the ovaries, vagina, or pelvis.  Vaginal  dryness.  Thinning of the tissue (atrophy) of the vulva and vagina.  Skin conditions that affect the vulva (vulvar dermatoses), such as lichen sclerosus or lichen planus.  Endometriosis.  Tubal pregnancy.  A tilted uterus.  Uterine prolapse.  Adhesions in the vagina.  Bladder problems.  Intestinal problems.  Certain medicines.  Medical conditions such as diabetes, arthritis, or thyroid disease.  What increases the risk? The following factors may make you more likely to develop this condition:  Having experienced physical or sexual trauma.  Having given birth more than once.  Taking birth control pills.  Having gone through menopause.  Having recently given birth, typically within the past 3-6 months.  Breastfeeding.  What are the signs or symptoms? The main symptom of this condition is pain in any part of the genitals or lower abdomen during or after sexual activity. This may include pain during sexual arousal, genital stimulation, or orgasm. Pain may get worse when anything is inserted into the vagina, or when the genitals are touched in any way, such as when sitting or wearing pants. Pain can range from mild to severe, depending on the cause of the condition. In some cases, symptoms go away with treatment and return (recur) at a later date. How is this diagnosed? This condition may be diagnosed based on:  Your symptoms, including: ? Where your pain is located. ? When your pain occurs.  Your medical history.  A physical exam. This may include a pelvic exam and a Pap test. This is a screening test that is used to check for signs of cancer of the vagina, cervix, and uterus.  Tests, including: ? Blood tests. ? Ultrasound. This uses sound waves to make a picture of the area that is being tested. ? Urine culture. This test involves checking a urine sample for signs of infection. ? Culture test. This is when your health care provider uses a swab to collect a sample  of vaginal fluid. The sample is checked for signs of infection. ? X-rays. ? MRI. ? CT scan. ? Laparoscopy. This is a procedure in which a small incision is made in your lower abdomen and a lighted, pencil-sized instrument (laparoscope) is passed through the incision and used to look inside your pelvis.  You may be referred to a health care provider who specializes in women's health (gynecologist). In some cases, diagnosing the cause of dyspareunia can be difficult. How is this treated? Treatment depends on the cause of your condition and your symptoms. In most cases, you may need to stop sexual activity until your symptoms improve. Treatment may include:  Lubricants.  Kegel exercises or vaginal dilators.  Medicated skin creams.  Medicated vaginal creams.  Hormonal therapy.  Antibiotic medicine to prevent or fight infection.  Medicines that help to relieve pain.  Medicines that treat depression (antidepressants).  Psychological counseling.  Sex therapy.  Surgery.  Follow these instructions at home: Lifestyle  Avoid tight clothing and irritating materials around your genital and abdominal area.  Use water-based lubricants as needed. Avoid oil-based lubricants.  Do not use any products that irritate you. This may include certain condoms, spermicides, lubricants, soaps, tampons, vaginal sprays, or douches.  Always practice safe sex. Talk with your health care provider about which form of birth control (contraception) is best for you.  Maintain open communication with your sexual partner. General instructions  Take over-the-counter and prescription medicines only as told by your health care provider.  If you had tests done, it is your responsibility to get your tests results. Ask your health care provider or the department performing the test when your results will be ready.  Urinate before you engage in sexual activity.  Consider joining a support group.  Keep all  follow-up visits as told by your health care provider. This is important. Contact a health care provider if:  You develop vaginal bleeding after sexual intercourse.  You develop a lump at the opening of your vagina. Seek medical care even if the lump is painless.  You have: ? Abnormal vaginal discharge. ? Vaginal dryness. ? Itchiness or irritation of your vulva or vagina. ? A new rash. ? Symptoms that get worse or do not improve with treatment. ? A fever. ? Pain when you urinate. ? Blood in your urine. Get help right away if:  You develop severe pain in your abdomen during or shortly after sexual intercourse.  You pass out after having sexual intercourse. This information is not intended to replace advice given to you by your health care provider. Make sure you discuss any questions you have with your health care provider. Document Released: 03/17/2007 Document Revised: 07/07/2015 Document Reviewed: 09/27/2014 [employer]vier Interactive Patient Education  [employer].

## 2017-01-16 NOTE — Progress Notes (Signed)
Assessment and Plan:   Essential hypertension - continue medications, DASH diet, exercise and monitor at home. Call if greater than 130/80.   Smoker Smoking cessation-  commended patient for slowing down and wanting to quit and reviewed strategies for preventing relapses -     budesonide-formoterol (SYMBICORT) 160-4.5 MCG/ACT inhaler; Inhale 2 puffs 2 (two) times daily into the lungs.  Hyperlipidemia, unspecified hyperlipidemia type -continue medications, check lipids, decrease fatty foods, increase activity.   Recurrent major depressive disorder, in partial remission ([place]) Depression - continue medications, stress management techniques discussed, increase water, good sleep hygiene discussed, increase exercise, and increase veggies.   Acute right ankle pain -     meloxicam (MOBIC) 15 MG tablet; Take one daily with food  as needed daily for pain   Continue diet and meds as discussed. Further disposition pending results of labs.  HPI 56 y.o. female  presents for 3 month follow up with hypertension, hyperlipidemia, prediabetes and vitamin D.   Her blood pressure has been controlled at home, today their BP is BP: 122/86.   She does not workout. She denies chest pain, shortness of breath, dizziness. She has asthma, does smoke but is cutting back, won't smoke around grand daughter and keeps her from 730-530 daily and with the cold she will not smoke as much for going out.    She is on cholesterol medication and denies myalgias. Her cholesterol is at goal. The cholesterol last visit was:   Lab Results  Component Value Date   CHOL 234 (H) 11/22/2015   HDL 55 11/22/2015   LDLCALC 149 (H) 11/22/2015   TRIG 151 (H) 11/22/2015   CHOLHDL 4.3 11/22/2015    She has been working on diet and exercise for prediabetes, and denies foot ulcerations, hyperglycemia, hypoglycemia , increased appetite, nausea, paresthesia of the feet, polydipsia, polyuria, visual disturbances, vomiting and weight loss.  Last A1C in the office was:  Lab Results  Component Value Date   HGBA1C 5.5 05/18/2015   Patient is on Vitamin D supplement.  Lab Results  Component Value Date   VD25OH 31 11/22/2015     BMI is Body mass index is 28.59 kg/m., she is working on diet and exercise. Wt Readings from Last 3 Encounters:  01/16/17 179 lb 12.8 oz (81.6 kg)  06/12/16 188 lb (85.3 kg)  02/23/16 182 lb (82.6 kg)       Current Medications:  Current Outpatient Medications on File Prior to Visit  Medication Sig Dispense Refill  . ADVAIR DISKUS 100-50 MCG/DOSE AEPB USE 1 INHALATION TWICE  DAILY 180 each 1  . albuterol (VENTOLIN HFA) 108 (90 Base) MCG/ACT inhaler Use 2 puffs every 6 hours  as needed for shortness of  breath 72 g 2  . azelastine (ASTELIN) 0.1 % nasal spray Place 2 sprays into both nostrils 2 (two) times daily. Use in each nostril as directed 30 mL 2  . benzonatate (TESSALON PERLES) 100 MG capsule Take 1 capsule (100 mg total) by mouth every 6 (six) hours as needed for cough. 30 capsule 1  . cholecalciferol (VITAMIN D) 1000 UNITS tablet Take 2,000 Units by mouth daily.     . diazepam (VALIUM) 2 MG tablet Take 1 tablet (2 mg total) by mouth every 8 (eight) hours as needed for anxiety (dizziness). 180 tablet 0  . escitalopram (LEXAPRO) 20 MG tablet TAKE 1 TABLET BY MOUTH  DAILY 90 tablet 1  . fluticasone (FLONASE) 50 MCG/ACT nasal spray Place 1 spray into both nostrils daily.  16 g 2  . ofloxacin (FLOXIN) 0.3 % otic solution Place 5 drops into the right ear 2 (two) times daily. 5 mL 0  . pantoprazole (PROTONIX) 40 MG tablet TAKE 1 TABLET BY MOUTH  DAILY 90 tablet 0  . triamcinolone cream (KENALOG) 0.1 % Apply 1 application topically 3 (three) times daily. Place a thin layer over the affected areas 30 g 0   No current facility-administered medications on file prior to visit.     Medical History:  Past Medical History:  Diagnosis Date  . Allergy   . Arthritis   . Asthma   . Benign hematuria 2004   . Depression   . Hyperlipidemia   . Hypertension   . TMJ (temporomandibular joint syndrome)   . Vitamin D deficiency     Allergies:  Allergies  Allergen Reactions  . Azithromycin Hives  . Celexa [Citalopram Hydrobromide]     Myalgias  . Trintellix [Vortioxetine] Other (See Comments)    Dysphoria   . Vicodin [Hydrocodone-Acetaminophen] Nausea And Vomiting  . Wellbutrin [Bupropion]     anxiety     Review of Systems:  Review of Systems  Constitutional: Negative for chills, fever and malaise/fatigue.  HENT: Negative for congestion, ear pain and sore throat.   Respiratory: Negative for cough, shortness of breath and wheezing.   Cardiovascular: Negative for chest pain, palpitations and leg swelling.  Gastrointestinal: Negative for blood in stool, constipation, diarrhea, heartburn and melena.  Genitourinary: Negative.   Musculoskeletal: Positive for myalgias.  Neurological: Negative for dizziness, sensory change, loss of consciousness and headaches.  Psychiatric/Behavioral: Negative for depression. The patient has insomnia (secondary to night sweats.). The patient is not nervous/anxious.     Family history- Review and unchanged  Social history- Review and unchanged  Physical Exam: BP 122/86   Pulse 75   Temp (!) 97.3 F (36.3 C)   Resp 18   Ht 5' 6.5" (1.689 m)   Wt 179 lb 12.8 oz (81.6 kg)   SpO2 98%   BMI 28.59 kg/m  Wt Readings from Last 3 Encounters:  01/16/17 179 lb 12.8 oz (81.6 kg)  06/12/16 188 lb (85.3 kg)  02/23/16 182 lb (82.6 kg)    General Appearance: Well nourished well developed, in no apparent distress. Eyes: PERRLA, EOMs, conjunctiva no swelling or erythema ENT/Mouth: Ear canals normal without obstruction, swelling, erythma, discharge.  TMs normal bilaterally.  Oropharynx moist, clear, without exudate, or postoropharyngeal swelling. Neck: Supple, thyroid normal,no cervical adenopathy  Respiratory: Respiratory effort normal, Breath sounds clear A&P  without rhonchi, wheeze, or rale.  No retractions, no accessory usage. Cardio: RRR with no MRGs. Brisk peripheral pulses without edema.  Abdomen: Soft, + BS,  Non tender, no guarding, rebound, hernias, masses. Musculoskeletal: Full ROM, 5/5 strength, Normal gait Skin: Warm, dry without rashes, lesions, ecchymosis.  Neuro: Awake and oriented X 3, Cranial nerves intact. Normal muscle tone, no cerebellar symptoms. Psych: Normal affect, Insight and Judgment appropriate.    Vicie Mutters, PA-C 4:22 PM [facility]

## 2017-01-17 NOTE — Progress Notes (Signed)
Pt aware of lab results & voiced understanding of those results.

## 2017-01-29 ENCOUNTER — Telehealth: Payer: Self-pay | Admitting: Physician Assistant

## 2017-01-29 MED ORDER — SULFAMETHOXAZOLE-TRIMETHOPRIM 800-160 MG PO TABS: 1.0000 | ORAL_TABLET | Freq: Two times a day (BID) | ORAL | 0 refills | Status: DC

## 2017-01-29 NOTE — Telephone Encounter (Signed)
Patient is calling with infected ingrown hair on her labia, has been doing hot compresses, no fever, chills, it is not draining.  Will send in bactrim, continue hot compresses, can do sitz bathes and follow up next week if not better for evaluation.

## 2017-01-29 NOTE — Telephone Encounter (Signed)
Patient notified

## 2017-02-15 ENCOUNTER — Other Ambulatory Visit: Payer: Self-pay | Admitting: Physician Assistant

## 2017-02-15 DIAGNOSIS — J45909 Unspecified asthma, uncomplicated: Secondary | ICD-10-CM

## 2017-02-18 ENCOUNTER — Telehealth: Payer: Self-pay | Admitting: Physician Assistant

## 2017-02-18 DIAGNOSIS — R42 Dizziness and giddiness: Secondary | ICD-10-CM

## 2017-02-18 MED ORDER — DIAZEPAM 2 MG PO TABS: 2.0000 mg | ORAL_TABLET | Freq: Three times a day (TID) | ORAL | 0 refills | Status: DC | PRN

## 2017-02-18 NOTE — Telephone Encounter (Signed)
-----   Message from Elenor Quinones, [place] sent at 02/18/2017  1:15 PM EST ----- Regarding: med refill Refill on: diazepam please

## 2017-02-18 NOTE — Telephone Encounter (Signed)
Diazepam has been called into pharmacy on Dec 11th 2018 by DD

## 2017-03-06 DIAGNOSIS — Z803 Family history of malignant neoplasm of breast: Secondary | ICD-10-CM | POA: Diagnosis not present

## 2017-03-06 DIAGNOSIS — Z1231 Encounter for screening mammogram for malignant neoplasm of breast: Secondary | ICD-10-CM | POA: Diagnosis not present

## 2017-03-06 LAB — HM MAMMOGRAPHY

## 2017-03-20 ENCOUNTER — Encounter: Payer: Self-pay | Admitting: *Deleted

## 2017-04-10 ENCOUNTER — Other Ambulatory Visit: Payer: Self-pay | Admitting: Physician Assistant

## 2017-05-21 ENCOUNTER — Other Ambulatory Visit: Payer: Self-pay

## 2017-05-21 DIAGNOSIS — M25571 Pain in right ankle and joints of right foot: Secondary | ICD-10-CM

## 2017-05-21 MED ORDER — MELOXICAM 15 MG PO TABS: ORAL_TABLET | ORAL | 1 refills | Status: DC

## 2017-06-01 DIAGNOSIS — M25561 Pain in right knee: Secondary | ICD-10-CM | POA: Diagnosis not present

## 2017-07-13 ENCOUNTER — Other Ambulatory Visit: Payer: Self-pay | Admitting: Physician Assistant

## 2017-07-14 NOTE — Progress Notes (Signed)
Assessment and Plan:   Essential hypertension - continue medications, DASH diet, exercise and monitor at home. Call if greater than 130/80.   Smoker Smoking cessation-  commended patient for slowing down and wanting to quit and reviewed strategies for quitting, declines meds at this time - -     DG Chest 2 View; Future  Hyperlipidemia, unspecified hyperlipidemia type -continue medications, check lipids, decrease fatty foods, increase activity.   Recurrent major depressive disorder, in partial remission ([place]) Depression - continue medications, stress management techniques discussed, increase water, good sleep hygiene discussed, increase exercise, and increase veggies.   B12 deficiency -     Iron,Total/Total Iron Binding Cap -     Vitamin B12  Medication management -     Magnesium  Vitamin D deficiency -     VITAMIN D 25 Hydroxy (Vit-D Deficiency, Fractures)  Rash right heel -     triamcinolone cream (KENALOG) 0.1 %; Apply 1 application topically 3 (three) times daily. Place a thin layer over the affected areas - will call if any signs of infection for ABX  Uncomplicated asthma -     albuterol (VENTOLIN HFA) 108 (90 Base) MCG/ACT inhaler; USE 2 PUFFS EVERY 6 HOURS  AS NEEDED FOR SHORTNESS OF  BREATH -     Fluticasone-Salmeterol (ADVAIR DISKUS) 100-50 MCG/DOSE AEPB; USE 1 INHALATION TWICE  DAILY   Continue diet and meds as discussed. Further disposition pending results of labs. Future Appointments  Date Time Provider [place]  12/30/2017  9:00 AM Vicie Mutters, PA-C [id number] None    HPI 57 y.o. female  presents for 3 month follow up with hypertension, hyperlipidemia, prediabetes and vitamin D.   Her blood pressure has been controlled at home, today their BP is BP: 120/78.   She does not workout. She denies chest pain, shortness of breath, dizziness. She has asthma, does smoke but is cutting back, won't smoke around granddaughter and keeps her from 730-530  daily, will only smoke at night.   She is tried, sleeps well with melatonin, keeps her grandbaby. Due for colonoscopy this year. UTD on MGM, need CXR last one was 2015.   She has some right heel pain, did a at home pedicure scrapper and has had dry skin/pain since. No warmth, some redness, no discharge.    She is on cholesterol medication and denies myalgias. Her cholesterol is at goal. The cholesterol last visit was:   Lab Results  Component Value Date   CHOL 215 (H) 01/16/2017   HDL 49 (L) 01/16/2017   LDLCALC 127 (H) 01/16/2017   TRIG 253 (H) 01/16/2017   CHOLHDL 4.4 01/16/2017    She has been working on diet and exercise for prediabetes, and denies foot ulcerations, hyperglycemia, hypoglycemia , increased appetite, nausea, paresthesia of the feet, polydipsia, polyuria, visual disturbances, vomiting and weight loss. Last A1C in the office was:  Lab Results  Component Value Date   HGBA1C 5.5 05/18/2015   Patient is on Vitamin D supplement.  Lab Results  Component Value Date   VD25OH 31 11/22/2015     BMI is Body mass index is 29.16 kg/m., she is working on diet and exercise. Wt Readings from Last 3 Encounters:  07/16/17 183 lb 6.4 oz (83.2 kg)  01/16/17 179 lb 12.8 oz (81.6 kg)  06/12/16 188 lb (85.3 kg)       Current Medications:  Current Outpatient Medications on File Prior to Visit  Medication Sig Dispense Refill  . ADVAIR DISKUS 100-50 MCG/DOSE AEPB  USE 1 INHALATION TWICE  DAILY 180 each 1  . albuterol (VENTOLIN HFA) 108 (90 Base) MCG/ACT inhaler USE 2 PUFFS EVERY 6 HOURS  AS NEEDED FOR SHORTNESS OF  BREATH 48 g 1  . budesonide-formoterol (SYMBICORT) 160-4.5 MCG/ACT inhaler Inhale 2 puffs 2 (two) times daily into the lungs. 3 Inhaler 4  . cholecalciferol (VITAMIN D) 1000 UNITS tablet Take 2,000 Units by mouth daily.     . diazepam (VALIUM) 2 MG tablet Take 1 tablet (2 mg total) by mouth every 8 (eight) hours as needed for anxiety (dizziness). 180 tablet 0  .  escitalopram (LEXAPRO) 20 MG tablet TAKE 1 TABLET BY MOUTH  DAILY 90 tablet 1  . fluticasone (FLONASE) 50 MCG/ACT nasal spray Place 1 spray into both nostrils daily. 16 g 2  . meloxicam (MOBIC) 15 MG tablet Take one daily with food  as needed daily for pain 30 tablet 1  . montelukast (SINGULAIR) 10 MG tablet TAKE 1 TABLET BY MOUTH  DAILY 90 tablet 3  . ofloxacin (FLOXIN) 0.3 % otic solution Place 5 drops into the right ear 2 (two) times daily. 5 mL 0  . pantoprazole (PROTONIX) 40 MG tablet TAKE 1 TABLET BY MOUTH  DAILY 90 tablet 0  . triamcinolone cream (KENALOG) 0.1 % Apply 1 application topically 3 (three) times daily. Place a thin layer over the affected areas 30 g 0  . azelastine (ASTELIN) 0.1 % nasal spray Place 2 sprays into both nostrils 2 (two) times daily. Use in each nostril as directed 30 mL 2   No current facility-administered medications on file prior to visit.     Medical History:  Past Medical History:  Diagnosis Date  . Allergy   . Arthritis   . Asthma   . Benign hematuria 2004  . Depression   . Hyperlipidemia   . Hypertension   . TMJ (temporomandibular joint syndrome)   . Vitamin D deficiency     Allergies:  Allergies  Allergen Reactions  . Azithromycin Hives  . Celexa [Citalopram Hydrobromide]     Myalgias  . Trintellix [Vortioxetine] Other (See Comments)    Dysphoria   . Vicodin [Hydrocodone-Acetaminophen] Nausea And Vomiting  . Wellbutrin [Bupropion]     anxiety     Review of Systems:  Review of Systems  Constitutional: Positive for malaise/fatigue. Negative for chills and fever.  HENT: Negative for congestion, ear pain and sore throat.   Respiratory: Negative for cough, shortness of breath and wheezing.   Cardiovascular: Negative for chest pain, palpitations and leg swelling.  Gastrointestinal: Negative for blood in stool, constipation, diarrhea, heartburn and melena.  Genitourinary: Negative.   Musculoskeletal: Positive for myalgias.   Neurological: Negative for dizziness, sensory change, loss of consciousness and headaches.  Psychiatric/Behavioral: Negative for depression. The patient has insomnia (secondary to night sweats.). The patient is not nervous/anxious.     Family history- Review and unchanged  Social history- Review and unchanged  Physical Exam: BP 120/78   Pulse 67   Temp (!) 97.3 F (36.3 C)   Resp 16   Ht 5' 6.5" (1.689 m)   Wt 183 lb 6.4 oz (83.2 kg)   SpO2 98%   BMI 29.16 kg/m  Wt Readings from Last 3 Encounters:  07/16/17 183 lb 6.4 oz (83.2 kg)  01/16/17 179 lb 12.8 oz (81.6 kg)  06/12/16 188 lb (85.3 kg)    General Appearance: Well nourished well developed, in no apparent distress. Eyes: PERRLA, EOMs, conjunctiva no swelling or erythema ENT/Mouth:  Ear canals normal without obstruction, swelling, erythma, discharge.  TMs normal bilaterally.  Oropharynx moist, clear, without exudate, or postoropharyngeal swelling. Neck: Supple, thyroid normal,no cervical adenopathy  Respiratory: Respiratory effort normal, Breath sounds clear A&P without rhonchi, wheeze, or rale.  No retractions, no accessory usage. Cardio: RRR with no MRGs. Brisk peripheral pulses without edema.  Abdomen: Soft, + BS,  Non tender, no guarding, rebound, hernias, masses. Musculoskeletal: Full ROM, 5/5 strength, Normal gait Skin: Right posterior heel with erythema, dry cracking skin, no warmth, no discharge, nontender. Warm, dry without rashes, lesions, ecchymosis.  Neuro: Awake and oriented X 3, Cranial nerves intact. Normal muscle tone, no cerebellar symptoms. Psych: Normal affect, Insight and Judgment appropriate.    Vicie Mutters, PA-C 8:55 AM [facility]

## 2017-07-15 ENCOUNTER — Ambulatory Visit: Payer: Self-pay | Admitting: Adult Health

## 2017-07-16 ENCOUNTER — Ambulatory Visit: Payer: 59 | Admitting: Physician Assistant

## 2017-07-16 ENCOUNTER — Encounter: Payer: Self-pay | Admitting: Physician Assistant

## 2017-07-16 VITALS — BP 120/78 | HR 67 | Temp 97.3°F | Resp 16 | Ht 66.5 in | Wt 183.4 lb

## 2017-07-16 DIAGNOSIS — E538 Deficiency of other specified B group vitamins: Secondary | ICD-10-CM | POA: Diagnosis not present

## 2017-07-16 DIAGNOSIS — F17200 Nicotine dependence, unspecified, uncomplicated: Secondary | ICD-10-CM

## 2017-07-16 DIAGNOSIS — E559 Vitamin D deficiency, unspecified: Secondary | ICD-10-CM | POA: Diagnosis not present

## 2017-07-16 DIAGNOSIS — E785 Hyperlipidemia, unspecified: Secondary | ICD-10-CM | POA: Diagnosis not present

## 2017-07-16 DIAGNOSIS — J069 Acute upper respiratory infection, unspecified: Secondary | ICD-10-CM

## 2017-07-16 DIAGNOSIS — J4520 Mild intermittent asthma, uncomplicated: Secondary | ICD-10-CM | POA: Diagnosis not present

## 2017-07-16 DIAGNOSIS — I10 Essential (primary) hypertension: Secondary | ICD-10-CM | POA: Diagnosis not present

## 2017-07-16 DIAGNOSIS — J45909 Unspecified asthma, uncomplicated: Secondary | ICD-10-CM | POA: Diagnosis not present

## 2017-07-16 DIAGNOSIS — Z79899 Other long term (current) drug therapy: Secondary | ICD-10-CM

## 2017-07-16 MED ORDER — TRIAMCINOLONE ACETONIDE 0.1 % EX CREA: 1.0000 "application " | TOPICAL_CREAM | Freq: Three times a day (TID) | CUTANEOUS | 0 refills | Status: DC

## 2017-07-16 MED ORDER — ALBUTEROL SULFATE HFA 108 (90 BASE) MCG/ACT IN AERS: INHALATION_SPRAY | RESPIRATORY_TRACT | 1 refills | Status: DC

## 2017-07-16 MED ORDER — FLUTICASONE-SALMETEROL 100-50 MCG/DOSE IN AEPB: INHALATION_SPRAY | RESPIRATORY_TRACT | 1 refills | Status: DC

## 2017-07-16 NOTE — Patient Instructions (Addendum)
Call about your colonoscopy [phone];  Get chest xray  [facility] [place] go to radiology and give them your name. They will have the order and take you back. You do not any paper work, I should get the result back today or tomorrow. This order is good for a year.   Use hydrocortisone cream at night, use warp at night to occlude it, if any warmth, swelling, discharge call the office.   Fatigue Fatigue is feeling tired all of the time, a lack of energy, or a lack of motivation. Occasional or mild fatigue is often a normal response to activity or life in general. However, long-lasting (chronic) or extreme fatigue may indicate an underlying medical condition. Follow these instructions at home: Watch your fatigue for any changes. The following actions may help to lessen any discomfort you are feeling:  Talk to your health care provider about how much sleep you need each night. Try to get the required amount every night.  Take medicines only as directed by your health care provider.  Eat a healthy and nutritious diet. Ask your health care provider if you need help changing your diet.  Drink enough fluid to keep your urine clear or pale yellow.  Practice ways of relaxing, such as yoga, meditation, massage therapy, or acupuncture.  Exercise regularly.  Change situations that cause you stress. Try to keep your work and personal routine reasonable.  Do not abuse illegal drugs.  Limit alcohol intake to no more than 1 drink per day for nonpregnant women and 2 drinks per day for men. One drink equals 12 ounces of beer, 5 ounces of wine, or 1 ounces of hard liquor.  Take a multivitamin, if directed by your health care provider.  Contact a health care provider if:  Your fatigue does not get better.  You have a fever.  You have unintentional weight loss or gain.  You have headaches.  You have difficulty: ? Falling asleep. ? Sleeping throughout the night.  You feel  angry, guilty, anxious, or sad.  You are unable to have a bowel movement (constipation).  You skin is dry.  Your legs or another part of your body is swollen. Get help right away if:  You feel confused.  Your vision is blurry.  You feel faint or pass out.  You have a severe headache.  You have severe abdominal, pelvic, or back pain.  You have chest pain, shortness of breath, or an irregular or fast heartbeat.  You are unable to urinate or you urinate less than normal.  You develop abnormal bleeding, such as bleeding from the rectum, vagina, nose, lungs, or nipples.  You vomit blood.  You have thoughts about harming yourself or committing suicide.  You are worried that you might harm someone else. This information is not intended to replace advice given to you by your health care provider. Make sure you discuss any questions you have with your health care provider. Document Released: 12/23/2006 Document Revised: 08/03/2015 Document Reviewed: 06/29/2013 Elsevier Interactive Patient Education  [employer].

## 2017-07-17 LAB — COMPLETE METABOLIC PANEL WITH GFR
AG Ratio: 1.8 (calc) (ref 1.0–2.5)
ALKALINE PHOSPHATASE (APISO): 103 U/L (ref 33–130)
ALT: 20 U/L (ref 6–29)
AST: 21 U/L (ref 10–35)
Albumin: 4.2 g/dL (ref 3.6–5.1)
BILIRUBIN TOTAL: 0.7 mg/dL (ref 0.2–1.2)
BUN: 15 mg/dL (ref 7–25)
CHLORIDE: 107 mmol/L (ref 98–110)
CO2: 26 mmol/L (ref 20–32)
CREATININE: 0.85 mg/dL (ref 0.50–1.05)
Calcium: 9.5 mg/dL (ref 8.6–10.4)
GFR, Est African American: 89 mL/min/{1.73_m2} (ref >=60)
GFR, Est Non African American: 77 mL/min/{1.73_m2} (ref >=60)
GLUCOSE: 89 mg/dL (ref 65–99)
Globulin: 2.4 g/dL (calc) (ref 1.9–3.7)
Potassium: 4.4 mmol/L (ref 3.5–5.3)
Sodium: 140 mmol/L (ref 135–146)
Total Protein: 6.6 g/dL (ref 6.1–8.1)

## 2017-07-17 LAB — CBC WITH DIFFERENTIAL/PLATELET
BASOS ABS: 48 {cells}/uL (ref 0–200)
BASOS PCT: 0.7 %
EOS PCT: 1.7 %
Eosinophils Absolute: 117 cells/uL (ref 15–500)
HCT: 40.1 % (ref 35.0–45.0)
Hemoglobin: 13.8 g/dL (ref 11.7–15.5)
Lymphs Abs: 1987 cells/uL (ref 850–3900)
MCH: 30.9 pg (ref 27.0–33.0)
MCHC: 34.4 g/dL (ref 32.0–36.0)
MCV: 89.7 fL (ref 80.0–100.0)
MONOS PCT: 8.1 %
MPV: 10.6 fL (ref 7.5–12.5)
NEUTROS ABS: 4188 {cells}/uL (ref 1500–7800)
Neutrophils Relative %: 60.7 %
PLATELETS: 306 10*3/uL (ref 140–400)
RBC: 4.47 10*6/uL (ref 3.80–5.10)
RDW: 12.7 % (ref 11.0–15.0)
TOTAL LYMPHOCYTE: 28.8 %
WBC mixed population: 559 cells/uL (ref 200–950)
WBC: 6.9 10*3/uL (ref 3.8–10.8)

## 2017-07-17 LAB — LIPID PANEL
CHOLESTEROL: 201 mg/dL — AB (ref <200)
HDL: 55 mg/dL (ref >50)
LDL CHOLESTEROL (CALC): 121 mg/dL — AB
Non-HDL Cholesterol (Calc): 146 mg/dL (calc) — ABNORMAL HIGH (ref <130)
TRIGLYCERIDES: 130 mg/dL (ref <150)
Total CHOL/HDL Ratio: 3.7 (calc) (ref <5.0)

## 2017-07-17 LAB — MAGNESIUM: Magnesium: 2.1 mg/dL (ref 1.5–2.5)

## 2017-07-17 LAB — VITAMIN B12: Vitamin B-12: 789 pg/mL (ref 200–1100)

## 2017-07-17 LAB — VITAMIN D 25 HYDROXY (VIT D DEFICIENCY, FRACTURES): Vit D, 25-Hydroxy: 30 ng/mL (ref 30–100)

## 2017-07-17 LAB — IRON, TOTAL/TOTAL IRON BINDING CAP
%SAT: 38 % (ref 11–50)
Iron: 113 ug/dL (ref 45–160)
TIBC: 294 ug/dL (ref 250–450)

## 2017-07-17 LAB — TSH: TSH: 1.71 m[IU]/L (ref 0.40–4.50)

## 2017-07-22 DIAGNOSIS — M25561 Pain in right knee: Secondary | ICD-10-CM | POA: Diagnosis not present

## 2017-08-19 DIAGNOSIS — M25561 Pain in right knee: Secondary | ICD-10-CM | POA: Diagnosis not present

## 2017-08-22 DIAGNOSIS — M25561 Pain in right knee: Secondary | ICD-10-CM | POA: Diagnosis not present

## 2017-08-25 DIAGNOSIS — Z86010 Personal history of colonic polyps: Secondary | ICD-10-CM | POA: Diagnosis not present

## 2017-08-25 DIAGNOSIS — K635 Polyp of colon: Secondary | ICD-10-CM | POA: Diagnosis not present

## 2017-08-25 DIAGNOSIS — Z1211 Encounter for screening for malignant neoplasm of colon: Secondary | ICD-10-CM | POA: Diagnosis not present

## 2017-08-25 DIAGNOSIS — D120 Benign neoplasm of cecum: Secondary | ICD-10-CM | POA: Diagnosis not present

## 2017-08-25 DIAGNOSIS — D124 Benign neoplasm of descending colon: Secondary | ICD-10-CM | POA: Diagnosis not present

## 2017-08-25 LAB — HM COLONOSCOPY

## 2017-08-28 ENCOUNTER — Encounter: Payer: Self-pay | Admitting: Internal Medicine

## 2017-08-28 DIAGNOSIS — M25561 Pain in right knee: Secondary | ICD-10-CM | POA: Diagnosis not present

## 2017-09-04 ENCOUNTER — Other Ambulatory Visit: Payer: Self-pay | Admitting: Physician Assistant

## 2017-09-04 DIAGNOSIS — M25571 Pain in right ankle and joints of right foot: Secondary | ICD-10-CM

## 2017-09-21 DIAGNOSIS — M1711 Unilateral primary osteoarthritis, right knee: Secondary | ICD-10-CM | POA: Diagnosis not present

## 2017-09-27 DIAGNOSIS — M1711 Unilateral primary osteoarthritis, right knee: Secondary | ICD-10-CM | POA: Diagnosis not present

## 2017-09-27 DIAGNOSIS — G5762 Lesion of plantar nerve, left lower limb: Secondary | ICD-10-CM | POA: Diagnosis not present

## 2017-10-06 DIAGNOSIS — M1711 Unilateral primary osteoarthritis, right knee: Secondary | ICD-10-CM | POA: Diagnosis not present

## 2017-10-14 DIAGNOSIS — M1711 Unilateral primary osteoarthritis, right knee: Secondary | ICD-10-CM | POA: Diagnosis not present

## 2017-10-28 ENCOUNTER — Other Ambulatory Visit: Payer: Self-pay | Admitting: Physician Assistant

## 2017-10-28 ENCOUNTER — Other Ambulatory Visit: Payer: Self-pay | Admitting: Adult Health

## 2017-10-28 DIAGNOSIS — M25571 Pain in right ankle and joints of right foot: Secondary | ICD-10-CM

## 2017-11-09 HISTORY — PX: KNEE ARTHROSCOPY W/ DEBRIDEMENT: SHX1867

## 2017-11-12 DIAGNOSIS — M94261 Chondromalacia, right knee: Secondary | ICD-10-CM | POA: Diagnosis not present

## 2017-11-12 DIAGNOSIS — M23221 Derangement of posterior horn of medial meniscus due to old tear or injury, right knee: Secondary | ICD-10-CM | POA: Diagnosis not present

## 2017-11-12 DIAGNOSIS — M6751 Plica syndrome, right knee: Secondary | ICD-10-CM | POA: Diagnosis not present

## 2017-11-12 DIAGNOSIS — G8918 Other acute postprocedural pain: Secondary | ICD-10-CM | POA: Diagnosis not present

## 2017-12-09 DIAGNOSIS — M25561 Pain in right knee: Secondary | ICD-10-CM | POA: Diagnosis not present

## 2017-12-13 DIAGNOSIS — Z23 Encounter for immunization: Secondary | ICD-10-CM | POA: Diagnosis not present

## 2017-12-25 DIAGNOSIS — J449 Chronic obstructive pulmonary disease, unspecified: Secondary | ICD-10-CM | POA: Insufficient documentation

## 2017-12-25 NOTE — Progress Notes (Signed)
Complete Physical  Assessment and Plan: Abnormal EKG New T wave inversion and slight ST depression V3-V6 versus strain  Smoker, HTN, chol, fam hx of stroke in her dad in 64's 1 episode of SOB, nausea, sweating with exertion in Aug, no physical activity since that time due to knee surgery arthroscopic in Sept No CP but ? atypical presentation in female.  Go to the ER if any chest pain, shortness of breath, nausea, dizziness, severe HA, changes vision/speech  RUQ pain -     US Abdomen Complete; Future - if negative will get HIDA, + murphy, + nausea  Chronic LLQ pain Recent normal colonoscopy, no diarrhea, constipation, s/p hysterectomy Chronic pain x 2-3 years, will get PAP-? From pelvic floor, May need CT if not improved  + prolapse- declines pelvic floor PT at this time  Essential hypertension - continue medications, DASH diet, exercise and monitor at home. Call if greater than 130/80.  -     CBC with Differential/Platelet -     COMPLETE METABOLIC PANEL WITH GFR -     TSH -     Urinalysis, Routine w reflex microscopic -     Microalbumin / creatinine urine ratio -     EKG 12-Lead  Hyperlipidemia, unspecified hyperlipidemia type -     Lipid panel check lipids decrease fatty foods increase activity.   Recurrent major depressive disorder, in partial remission ([place]) - continue medications, stress management techniques discussed, increase water, good sleep hygiene discussed, increase exercise, and increase veggies.   Smoker -     DG Chest 2 View; Future Smoking cessation-  instruction/counseling given, counseled patient on the dangers of tobacco use, advised patient to stop smoking, and reviewed strategies to maximize success, patient not ready to quit at this time.   B12 deficiency -     Vitamin B12 -     Iron,Total/Total Iron Binding Cap  Estrogen deficiency Monitor  Vitamin D deficiency -     VITAMIN D 25 Hydroxy (Vit-D Deficiency, Fractures)  Benign hematuria Check  urine  Medication management -     Magnesium  Chronic obstructive pulmonary disease, unspecified COPD type ([place]) Advised to stop smoking - risks discussed. Declines medications.  CXR annually- will get today Currently stable with respiratory medications  Mild intermittent asthma without complication -     Fluticasone-Salmeterol (ADVAIR DISKUS) 100-50 MCG/DOSE AEPB; USE 1 INHALATION TWICE  DAILY  Screening for diabetes mellitus -     Hemoglobin A1c  Screening for cervical cancer -     Cytology - PAP   Discussed med's effects and SE's. Screening labs and tests as requested with regular follow-up as recommended. Future Appointments  Date Time Provider [place]  01/07/2019  9:00 AM Vicie Mutters, PA-C [id number] None    HPI 57 y.o. female  presents for a complete physical.  She has had 1 episode while outside working in the yard, she was picking up sticks walking around in August in the heat, had SOB, nausea diaphoresis and had to sit down, passed after 10-20 mins. Has not happened again. Still smoking hx of HTN, chol.   She has had nausea, GERD and some AB pain in RUQ. She also has had LLQ pain, had recent colonoscopy, no diarrhea/constipation. She has painful intercourse, feels heaviness from her vaginal canal, has incontinence. Has had hysterectomy. Has AB [place] 07/2015 negative for gallstones.   Her blood pressure has been controlled at home, today their BP is BP: 136/80 She does workout, walks daily.  She denies chest pain, shortness of breath, dizziness.  She has been stressed, her grand baby madelyn 2 1/2, from [place], is in the hospital.  She is not on cholesterol medication and denies myalgias. Her cholesterol is not at goal. The cholesterol last visit was:   Lab Results  Component Value Date   CHOL 201 (H) 07/16/2017   HDL 55 07/16/2017   LDLCALC 121 (H) 07/16/2017   TRIG 130 07/16/2017   CHOLHDL 3.7 07/16/2017   Last A1C in the office was:  Lab  Results  Component Value Date   HGBA1C 5.5 05/18/2015   Has COPD and uses Advair and albuterol. She continues to smoke but states when she starts watching the baby she will stop, last CXR 09/27/2013 with bilateral lung thickening and mild cardiomegaly.  She is on advair does daily and has albuterol inhaler that she has used some with allergies. She is on singulair, claritin, nasal spray.  She is on Lexapro and xanax for anxiety/depression but took valium for vertigo and states it makes her less drowsy and she would like to stay on that .  B12 was  Lab Results  Component Value Date   VITAMINB12 789 07/16/2017   Patient is on Vitamin D supplement, 5000IU   Lab Results  Component Value Date   VD25OH 30 07/16/2017   BMI is Body mass index is 29.38 kg/m., she is working on diet and exercise. Wt Readings from Last 3 Encounters:  12/30/17 182 lb (82.6 kg)  07/16/17 183 lb 6.4 oz (83.2 kg)  01/16/17 179 lb 12.8 oz (81.6 kg)     Current Medications:  Current Outpatient Medications on File Prior to Visit  Medication Sig Dispense Refill  . albuterol (VENTOLIN HFA) 108 (90 Base) MCG/ACT inhaler USE 2 PUFFS EVERY 6 HOURS  AS NEEDED FOR SHORTNESS OF  BREATH 48 g 1  . cholecalciferol (VITAMIN D) 1000 UNITS tablet Take 2,000 Units by mouth daily.     . diazepam (VALIUM) 2 MG tablet Take 1 tablet (2 mg total) by mouth every 8 (eight) hours as needed for anxiety (dizziness). 180 tablet 0  . escitalopram (LEXAPRO) 20 MG tablet TAKE 1 TABLET BY MOUTH  DAILY 90 tablet 1  . fluticasone (FLONASE) 50 MCG/ACT nasal spray Place 1 spray into both nostrils daily. 16 g 2  . Fluticasone-Salmeterol (ADVAIR DISKUS) 100-50 MCG/DOSE AEPB USE 1 INHALATION TWICE  DAILY 180 each 1  . meloxicam (MOBIC) 15 MG tablet TAKE 1 TABLET BY MOUTH  DAILY WITH FOOD AS NEEDED  FOR PAIN 90 tablet 0  . montelukast (SINGULAIR) 10 MG tablet TAKE 1 TABLET BY MOUTH  DAILY 90 tablet 3  . ofloxacin (FLOXIN) 0.3 % otic solution Place 5  drops into the right ear 2 (two) times daily. 5 mL 0  . triamcinolone cream (KENALOG) 0.1 % Apply 1 application topically 3 (three) times daily. Place a thin layer over the affected areas 30 g 0  . azelastine (ASTELIN) 0.1 % nasal spray Place 2 sprays into both nostrils 2 (two) times daily. Use in each nostril as directed 30 mL 2   No current facility-administered medications on file prior to visit.    Health Maintenance:   Immunization History  Administered Date(s) Administered  . Pneumococcal-Unspecified 04/03/2009  . Td 04/03/2004   TDAP: 09/2013 at pharmacy Pneumovax: 2011 Prevnar 13: due at age 33 Flu vaccine: 2015 at pharmacy Zostavax: N/A  Pap: 2016 negative HPV- due 5 years MGM:127/2018  DEXA: 09/2013 normal  Colonoscopy: 08/2017 polyps due 5 years EGD: N/A CXR 09/2013 stable cardiomegaly US pelvis 2011 DEE Dr. Sherlean Foot 3 years ago, reading glasses Dentist: Barney Drain, q 6 months  Medical History:  Past Medical History:  Diagnosis Date  . Allergy   . Arthritis   . Asthma   . Benign hematuria 2004  . Depression   . Hyperlipidemia   . Hypertension   . TMJ (temporomandibular joint syndrome)   . Vitamin D deficiency    Allergies Allergies  Allergen Reactions  . Azithromycin Hives  . Celexa [Citalopram Hydrobromide]     Myalgias  . Trintellix [Vortioxetine] Other (See Comments)    Dysphoria   . Vicodin [Hydrocodone-Acetaminophen] Nausea And Vomiting  . Wellbutrin [Bupropion]     anxiety   SURGICAL HISTORY She  has a past surgical history that includes Abdominal hysterectomy; Tubal ligation; and Breast surgery (Left). FAMILY HISTORY Her family history includes Arthritis in her mother; Cancer in her mother; Hypertension in her father; Stroke in her father. SOCIAL HISTORY She  reports that she has been smoking. She has a 12.50 pack-year smoking history. She has never used smokeless tobacco. She reports that she drinks alcohol. She reports that she has current  or past drug history. Drug: Marijuana.  Review of Systems  Constitutional: Positive for malaise/fatigue. Negative for chills, diaphoresis, fever and weight loss.  HENT: Negative for congestion, ear discharge, ear pain, hearing loss, nosebleeds, sore throat and tinnitus.   Eyes: Negative.   Respiratory: Negative for cough, hemoptysis, sputum production, shortness of breath, wheezing and stridor.   Cardiovascular: Negative.   Gastrointestinal: Negative for abdominal pain, blood in stool, constipation, diarrhea, heartburn, melena, nausea and vomiting.  Genitourinary: Negative for dysuria, flank pain, frequency, hematuria and urgency.  Musculoskeletal: Negative.   Skin: Negative.   Neurological: Negative.  Negative for weakness and headaches.  Psychiatric/Behavioral: Negative for depression, hallucinations, memory loss, substance abuse and suicidal ideas. The patient has insomnia. The patient is not nervous/anxious.    Physical Exam: Estimated body mass index is 29.38 kg/m as calculated from the following:   Height as of this encounter: 5\' 6"  (1.676 m).   Weight as of this encounter: 182 lb (82.6 kg). BP 136/80   Pulse 64   Temp 98.1 F (36.7 C)   Resp 16   Ht 5\' 6"  (1.676 m)   Wt 182 lb (82.6 kg)   SpO2 97%   BMI 29.38 kg/m  General Appearance: Well nourished, in no apparent distress. Eyes: PERRLA, EOMs, conjunctiva no swelling or erythema, normal fundi and vessels. Sinuses: No Frontal/maxillary tenderness ENT/Mouth: Ext aud canals clear, normal light reflex with TMs without erythema, bulging.  Good dentition. No erythema, swelling, or exudate on post pharynx. Tonsils not swollen or erythematous. Hearing normal. Crowded mouth.  Neck: Supple, thyroid normal. No bruits Respiratory: Respiratory effort normal, BS equal bilaterally without rales, rhonchi, wheezing or stridor. Cardio: RRR without murmurs, rubs or gallops. Brisk peripheral pulses without edema.  Chest: symmetric, with  normal excursions and percussion. Breasts: Symmetric, without lumps, nipple discharge, or retractions. Abdomen: Soft, +BS. RUQ pain, tender no guarding, rebound, hernias, masses, or organomegaly. .  Lymphatics: Non tender without lymphadenopathy.  Gen: defer Musculoskeletal: Full ROM all peripheral extremities,5/5 strength, and normal gait, well healing left ankle scars.  Skin: Warm, dry without rashes, lesions, ecchymosis.  Neuro: Cranial nerves intact, reflexes equal bilaterally. Normal muscle tone, no cerebellar symptoms. Sensation intact.  Psych: Awake and oriented X 3, normal affect, Insight and Judgment appropriate.  EKG: WNL no changes. AORTA SCAN: defer   Vicie Mutters 9:00 AM

## 2017-12-30 ENCOUNTER — Ambulatory Visit: Payer: 59 | Admitting: Physician Assistant

## 2017-12-30 ENCOUNTER — Other Ambulatory Visit (HOSPITAL_COMMUNITY)
Admission: RE | Admit: 2017-12-30 | Discharge: 2017-12-30 | Disposition: A | Discharge status: Home / Self Care | Payer: 59 | Source: Ambulatory Visit | Attending: Physician Assistant | Admitting: Physician Assistant

## 2017-12-30 ENCOUNTER — Encounter: Payer: Self-pay | Admitting: Physician Assistant

## 2017-12-30 ENCOUNTER — Ambulatory Visit (HOSPITAL_COMMUNITY)
Admission: RE | Admit: 2017-12-30 | Discharge: 2017-12-30 | Disposition: A | Discharge status: Home / Self Care | Payer: 59 | Source: Ambulatory Visit | Attending: Physician Assistant | Admitting: Physician Assistant

## 2017-12-30 VITALS — BP 136/80 | HR 64 | Temp 98.1°F | Resp 16 | Ht 66.0 in | Wt 182.0 lb

## 2017-12-30 DIAGNOSIS — I10 Essential (primary) hypertension: Secondary | ICD-10-CM | POA: Insufficient documentation

## 2017-12-30 DIAGNOSIS — Z124 Encounter for screening for malignant neoplasm of cervix: Secondary | ICD-10-CM

## 2017-12-30 DIAGNOSIS — F17200 Nicotine dependence, unspecified, uncomplicated: Secondary | ICD-10-CM

## 2017-12-30 DIAGNOSIS — Z136 Encounter for screening for cardiovascular disorders: Secondary | ICD-10-CM

## 2017-12-30 DIAGNOSIS — Z01419 Encounter for gynecological examination (general) (routine) without abnormal findings: Secondary | ICD-10-CM | POA: Diagnosis not present

## 2017-12-30 DIAGNOSIS — R0602 Shortness of breath: Secondary | ICD-10-CM | POA: Diagnosis not present

## 2017-12-30 DIAGNOSIS — Z131 Encounter for screening for diabetes mellitus: Secondary | ICD-10-CM

## 2017-12-30 DIAGNOSIS — F3341 Major depressive disorder, recurrent, in partial remission: Secondary | ICD-10-CM

## 2017-12-30 DIAGNOSIS — E785 Hyperlipidemia, unspecified: Secondary | ICD-10-CM | POA: Diagnosis not present

## 2017-12-30 DIAGNOSIS — R87610 Atypical squamous cells of undetermined significance on cytologic smear of cervix (ASC-US): Secondary | ICD-10-CM

## 2017-12-30 DIAGNOSIS — R1032 Left lower quadrant pain: Secondary | ICD-10-CM

## 2017-12-30 DIAGNOSIS — J811 Chronic pulmonary edema: Secondary | ICD-10-CM | POA: Diagnosis not present

## 2017-12-30 DIAGNOSIS — G8929 Other chronic pain: Secondary | ICD-10-CM

## 2017-12-30 DIAGNOSIS — Z79899 Other long term (current) drug therapy: Secondary | ICD-10-CM

## 2017-12-30 DIAGNOSIS — E538 Deficiency of other specified B group vitamins: Secondary | ICD-10-CM

## 2017-12-30 DIAGNOSIS — J449 Chronic obstructive pulmonary disease, unspecified: Secondary | ICD-10-CM

## 2017-12-30 DIAGNOSIS — N029 Recurrent and persistent hematuria with unspecified morphologic changes: Secondary | ICD-10-CM

## 2017-12-30 DIAGNOSIS — R1011 Right upper quadrant pain: Secondary | ICD-10-CM

## 2017-12-30 DIAGNOSIS — J4520 Mild intermittent asthma, uncomplicated: Secondary | ICD-10-CM

## 2017-12-30 DIAGNOSIS — Z0000 Encounter for general adult medical examination without abnormal findings: Secondary | ICD-10-CM | POA: Diagnosis not present

## 2017-12-30 DIAGNOSIS — E2839 Other primary ovarian failure: Secondary | ICD-10-CM

## 2017-12-30 DIAGNOSIS — R9431 Abnormal electrocardiogram [ECG] [EKG]: Secondary | ICD-10-CM

## 2017-12-30 DIAGNOSIS — E559 Vitamin D deficiency, unspecified: Secondary | ICD-10-CM

## 2017-12-30 MED ORDER — FLUTICASONE-SALMETEROL 100-50 MCG/DOSE IN AEPB: INHALATION_SPRAY | RESPIRATORY_TRACT | 1 refills | Status: DC

## 2017-12-30 NOTE — Patient Instructions (Signed)
INFORMATION ABOUT YOUR XRAY  [facility] [place] go to [facility]ology and give them your name. They will have the order and take you back. You do not any paper work, I should get the result back today or tomorrow. This order is good for a year.   Get on allergy pill If swallowing/thoat is not better may need to send to get a scope to check it out due to your smoking history, let us know.   Jennette  [employer]  [phone] for more information or for a free program for smoking cessation help.   You can call QUIT SMART 1-800-QUIT-NOW for free nicotine patches or replacement therapy- if they are out- keep calling  [facility] Can call for smoking cessation classes, [phone]  If you have a smart phone, please look up Smoke Free app, this will help you stay on track and give you information about money you have saved, life that you have gained back and a ton of more information.   We are giving you chantix for smoking cessation. You can do it! And we are here to help! You may have heard some scary side effects about chantix, the three most common I hear about are nausea, crazy dreams and depression.  However, I like for my patients to try to stay on 1/2 a tablet twice a day rather than one tablet twice a day as normally prescribed. This helps decrease the chances of side effects and helps save money by making a one month prescription last two months  Please start the prescription this way:  Start 1/2 tablet by mouth once daily after food with a full glass of water for 3 days Then do 1/2 tablet by mouth twice daily for 4 days. During this first week you can smoke, but try to stop after this week.  At this point we have several options: 1) continue on 1/2 tablet twice a day- which I encourage you to do. You can stay on this dose the rest of the time on the medication or if you still feel the need to smoke you can do one of the two  options below. 2) do one tablet in the morning and 1/2 in the evening which helps decrease dreams. 3) do one tablet twice a day.   What if I miss a dose? If you miss a dose, take it as soon as you can. If it is almost time for your next dose, take only that dose. Do not take double or extra doses.  What should I watch for while using this medicine? Visit your doctor or health care professional for regular check ups. Ask for ongoing advice and encouragement from your doctor or healthcare professional, friends, and family to help you quit. If you smoke while on this medication, quit again  Your mouth may get dry. Chewing sugarless gum or hard candy, and drinking plenty of water may help. Contact your doctor if the problem does not go away or is severe.  You may get drowsy or dizzy. Do not drive, use machinery, or do anything that needs mental alertness until you know how this medicine affects you. Do not stand or sit up quickly, especially if you are an older patient.   The use of this medicine may increase the chance of suicidal thoughts or actions. Pay special attention to how you are responding while on this medicine. Any worsening of mood, or thoughts of suicide or dying  should be reported to your health care professional right away.  ADVANTAGES OF QUITTING SMOKING  Within 20 minutes, blood pressure decreases. Your pulse is at normal level.  After 8 hours, carbon monoxide levels in the blood return to normal. Your oxygen level increases.  After 24 hours, the chance of having a heart attack starts to decrease. Your breath, hair, and body stop smelling like smoke.  After 48 hours, damaged nerve endings begin to recover. Your sense of taste and smell improve.  After 72 hours, the body is virtually free of nicotine. Your bronchial tubes relax and breathing becomes easier.  After 2 to 12 weeks, lungs can hold more air. Exercise becomes easier and circulation improves.  After 1 year, the  risk of coronary heart disease is cut in half.  After 5 years, the risk of stroke falls to the same as a nonsmoker.  After 10 years, the risk of lung cancer is cut in half and the risk of other cancers decreases significantly.  After 15 years, the risk of coronary heart disease drops, usually to the level of a nonsmoker.  You will have extra money to spend on things other than cigarettes.  GENERAL HEALTH GOALS  Know what a healthy weight is for you (roughly BMI <25) and aim to maintain this  Aim for 7+ servings of fruits and vegetables daily  70-80+ fluid ounces of water or unsweet tea for healthy kidneys  Limit to max 1 drink of alcohol per day; avoid smoking/tobacco  Limit animal fats in diet for cholesterol and heart health - choose grass fed whenever available  Avoid highly processed foods, and foods high in saturated/trans fats  Aim for low stress - take time to unwind and care for your mental health  Aim for 150 min of moderate intensity exercise weekly for heart health, and weights twice weekly for bone health  Aim for 7-9 hours of sleep daily

## 2017-12-31 LAB — HEMOGLOBIN A1C
Hgb A1c MFr Bld: 5.4 %{Hb}
Mean Plasma Glucose: 108 (calc)
eAG (mmol/L): 6 (calc)

## 2017-12-31 LAB — CBC WITH DIFFERENTIAL/PLATELET
BASOS PCT: 1 %
Basophils Absolute: 63 cells/uL (ref 0–200)
EOS PCT: 2.2 %
Eosinophils Absolute: 139 cells/uL (ref 15–500)
HCT: 42.5 % (ref 35.0–45.0)
HEMOGLOBIN: 14.4 g/dL (ref 11.7–15.5)
Lymphs Abs: 1802 cells/uL (ref 850–3900)
MCH: 30.3 pg (ref 27.0–33.0)
MCHC: 33.9 g/dL (ref 32.0–36.0)
MCV: 89.3 fL (ref 80.0–100.0)
MONOS PCT: 6.7 %
MPV: 10.9 fL (ref 7.5–12.5)
NEUTROS ABS: 3875 {cells}/uL (ref 1500–7800)
Neutrophils Relative %: 61.5 %
Platelets: 289 10*3/uL (ref 140–400)
RBC: 4.76 10*6/uL (ref 3.80–5.10)
RDW: 12.4 % (ref 11.0–15.0)
Total Lymphocyte: 28.6 %
WBC mixed population: 422 cells/uL (ref 200–950)
WBC: 6.3 10*3/uL (ref 3.8–10.8)

## 2017-12-31 LAB — VITAMIN B12: Vitamin B-12: 710 pg/mL (ref 200–1100)

## 2017-12-31 LAB — IRON, TOTAL/TOTAL IRON BINDING CAP
%SAT: 36 % (calc) (ref 16–45)
IRON: 105 ug/dL (ref 45–160)
TIBC: 295 ug/dL (ref 250–450)

## 2017-12-31 LAB — COMPLETE METABOLIC PANEL WITH GFR
AG Ratio: 2 (calc) (ref 1.0–2.5)
ALBUMIN MSPROF: 4.5 g/dL (ref 3.6–5.1)
ALT: 16 U/L (ref 6–29)
AST: 15 U/L (ref 10–35)
Alkaline phosphatase (APISO): 107 U/L (ref 33–130)
BILIRUBIN TOTAL: 0.6 mg/dL (ref 0.2–1.2)
BUN: 11 mg/dL (ref 7–25)
CO2: 27 mmol/L (ref 20–32)
Calcium: 9.9 mg/dL (ref 8.6–10.4)
Chloride: 105 mmol/L (ref 98–110)
Creat: 0.95 mg/dL (ref 0.50–1.05)
GFR, EST AFRICAN AMERICAN: 77 mL/min/{1.73_m2} (ref >=60)
GFR, EST NON AFRICAN AMERICAN: 66 mL/min/{1.73_m2} (ref >=60)
GLOBULIN: 2.3 g/dL (ref 1.9–3.7)
Glucose, Bld: 84 mg/dL (ref 65–99)
Potassium: 4.5 mmol/L (ref 3.5–5.3)
Sodium: 141 mmol/L (ref 135–146)
TOTAL PROTEIN: 6.8 g/dL (ref 6.1–8.1)

## 2017-12-31 LAB — LIPID PANEL
Cholesterol: 239 mg/dL — ABNORMAL HIGH (ref <200)
HDL: 52 mg/dL (ref >50)
LDL Cholesterol (Calc): 155 mg/dL (calc) — ABNORMAL HIGH
NON-HDL CHOLESTEROL (CALC): 187 mg/dL — AB (ref <130)
TRIGLYCERIDES: 181 mg/dL — AB (ref <150)
Total CHOL/HDL Ratio: 4.6 (calc) (ref <5.0)

## 2017-12-31 LAB — URINALYSIS, ROUTINE W REFLEX MICROSCOPIC
Bilirubin Urine: NEGATIVE
Glucose, UA: NEGATIVE
HYALINE CAST: NONE SEEN /LPF
Ketones, ur: NEGATIVE
LEUKOCYTES UA: NEGATIVE
Nitrite: NEGATIVE
Protein, ur: NEGATIVE
Specific Gravity, Urine: 1.016 (ref 1.001–1.03)
pH: 6.5 (ref 5.0–8.0)

## 2017-12-31 LAB — MICROALBUMIN / CREATININE URINE RATIO
Creatinine, Urine: 174 mg/dL (ref 20–275)
Microalb Creat Ratio: 5 ug/mg{creat}
Microalb, Ur: 0.8 mg/dL

## 2017-12-31 LAB — TSH: TSH: 2.05 m[IU]/L (ref 0.40–4.50)

## 2017-12-31 LAB — MAGNESIUM: Magnesium: 1.9 mg/dL (ref 1.5–2.5)

## 2017-12-31 LAB — VITAMIN D 25 HYDROXY (VIT D DEFICIENCY, FRACTURES): Vit D, 25-Hydroxy: 43 ng/mL (ref 30–100)

## 2018-01-01 LAB — CYTOLOGY - PAP
Diagnosis: UNDETERMINED — AB
HPV: NOT DETECTED

## 2018-01-02 DIAGNOSIS — R9431 Abnormal electrocardiogram [ECG] [EKG]: Secondary | ICD-10-CM | POA: Insufficient documentation

## 2018-01-02 NOTE — Progress Notes (Signed)
Cardiology Office Note:    Date:  01/05/2018   ID:  Nicole Phelps, DOB [birth year], MRN [id number]  PCP:  Unk Pinto, MD  Cardiologist:  Shirlee More, MD   Referring MD: Vicie Mutters, PA-C  ASSESSMENT:    1. Abnormal electrocardiogram (ECG) (EKG)   2. Essential hypertension   3. Angina pectoris ([place])   4. SOB (shortness of breath) on exertion   5. Hyperlipidemia, unspecified hyperlipidemia type    PLAN:    In order of problems listed above:  1. Her symptoms are typical angina class III associated with abnormal EKG.  Reviewed further evaluation with traditional stress test cardiac CTA or coronary angiography.  After review of the options risk and benefits she will undergo cardiac CTA and initiate medical therapy low-dose aspirin high intensity statin and rate limiting calcium channel blocker as she has asthma and uses an inhaler.  I strongly encouraged her regarding smoking cessation.  She will come back to see him in the office in 4 weeks and she has high risk markers and her ongoing symptoms refer to coronary angiography.  2.  BP is elevated today start calcium channel blocker 3. See above also given prescription for nitroglycerin 4. Anginal equivalent start medical therapy 5. Initiate a high intensity statin  Next appointment 4 weeks   Medication Adjustments/Labs and Tests Ordered: Current medicines are reviewed at length with the patient today.  Concerns regarding medicines are outlined above.  Orders Placed This Encounter  Procedures  . CT CORONARY MORPH W/CTA COR W/SCORE W/CA W/CM &/OR WO/CM  . CT CORONARY FRACTIONAL FLOW RESERVE DATA PREP  . CT CORONARY FRACTIONAL FLOW RESERVE FLUID ANALYSIS   No orders of the defined types were placed in this encounter.    Chief Complaint  Patient presents with  . Abnormal ECG    History of Present Illness:    Nicole Phelps is a 57 y.o. female who is being seen today for the evaluation of an abnormal EKG at the  request of Vicie Mutters, [place].  Her EKG recently performed shows new ST-T abnormality repolarization or ischemia but this is new as compared to electrocardiogram of 11/22/2015.  I independently reviewed the office EKG of 12/30/2017 as well as the previous one  for comparison.  She has not felt well since December.  Early in the summer she went out worked in the garden doing heavy labor became profoundly weak near loss of consciousness severely short of breath and improved with her inhalers and had tightness in her chest and throat the symptoms wax and waned all day long.  Since then she has had marked exercise intolerance and the same symptoms with physical effort but less severe and relieved with rest there is no radiation to the shoulder or back it is not pleuritic in nature no diaphoresis nausea or vomiting.  She saw her PCP for wellness exam and EKG shows ischemic changes that are new.  She wonders if she has had heart attack.  Her risk factors include hypertension hyperlipidemia presently untreated and cigarette smoking.  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  She finds her self fatigued and marked decrease in her functional ability and is struggling day-to-day. Past Medical History:  Diagnosis Date  . Allergy   . Arthritis   . Asthma   . Benign hematuria 2004  . Depression   . Hyperlipidemia   . Hypertension   . TMJ (  temporomandibular joint syndrome)   . Vitamin D deficiency     Past Surgical History:  Procedure Laterality Date  . ABDOMINAL HYSTERECTOMY    . ANKLE SURGERY Left 2017  . BREAST SURGERY Left    lumpectomy benign  . KNEE ARTHROSCOPY W/ DEBRIDEMENT Right 11/2017  . TUBAL LIGATION      Current Medications: Current Meds  Medication Sig  . albuterol (VENTOLIN HFA) 108 (90 Base) MCG/ACT inhaler USE 2 PUFFS EVERY 6 HOURS  AS NEEDED FOR SHORTNESS OF  BREATH  . azelastine (ASTELIN) 0.1 % nasal spray  Place 2 sprays into both nostrils 2 (two) times daily. Use in each nostril as directed  . cholecalciferol (VITAMIN D) 1000 UNITS tablet Take 2,000 Units by mouth daily.   . diazepam (VALIUM) 2 MG tablet Take 1 tablet (2 mg total) by mouth every 8 (eight) hours as needed for anxiety (dizziness).  Marland Kitchen escitalopram (LEXAPRO) 20 MG tablet TAKE 1 TABLET BY MOUTH  DAILY  . fluticasone (FLONASE) 50 MCG/ACT nasal spray Place 1 spray into both nostrils daily.  . Fluticasone-Salmeterol (ADVAIR DISKUS) 100-50 MCG/DOSE AEPB USE 1 INHALATION TWICE  DAILY  . meloxicam (MOBIC) 15 MG tablet TAKE 1 TABLET BY MOUTH  DAILY WITH FOOD AS NEEDED  FOR PAIN  . montelukast (SINGULAIR) 10 MG tablet TAKE 1 TABLET BY MOUTH  DAILY     Allergies:   Other; Azithromycin; Celexa [citalopram hydrobromide]; Trintellix [vortioxetine]; Vicodin [hydrocodone-acetaminophen]; and Wellbutrin [bupropion]   Social History   Socioeconomic History  . Marital status: Married    Spouse name: Not on file  . Number of children: Not on file  . Years of education: Not on file  . Highest education level: Not on file  Occupational History  . Not on file  Social Needs  . Financial resource strain: Not on file  . Food insecurity:    Worry: Not on file    Inability: Not on file  . Transportation needs:    Medical: Not on file    Non-medical: Not on file  Tobacco Use  . Smoking status: Current Every Day Smoker    Packs/day: 0.50    Years: 25.00    Pack years: 12.50  . Smokeless tobacco: Never Used  Substance and Sexual Activity  . Alcohol use: Not Currently  . Drug use: Yes    Types: Marijuana  . Sexual activity: Yes  Lifestyle  . Physical activity:    Days per week: Not on file    Minutes per session: Not on file  . Stress: Not on file  Relationships  . Social connections:    Talks on phone: Not on file    Gets together: Not on file    Attends religious service: Not on file    Active member of club or organization: Not on  file    Attends meetings of clubs or organizations: Not on file    Relationship status: Not on file  Other Topics Concern  . Not on file  Social History Narrative  . Not on file     Family History: The patient's family history includes Arthritis in her mother; Breast cancer in her sister; Cancer in her mother; Hypertension in her father; Stroke in her father.  ROS:   Review of Systems  Constitution: Positive for decreased appetite, diaphoresis and malaise/fatigue.  HENT: Positive for hearing loss.   Eyes: Positive for visual disturbance.  Cardiovascular: Positive for chest pain and dyspnea on exertion.  Respiratory: Positive for shortness of  breath.   Endocrine: Negative.   Hematologic/Lymphatic: Negative.   Skin: Negative.   Musculoskeletal: Positive for joint pain, muscle weakness and myalgias.  Gastrointestinal: Positive for abdominal pain.  Genitourinary: Negative.   Neurological: Positive for dizziness and headaches.  Psychiatric/Behavioral: Positive for depression. The patient is nervous/anxious.   Allergic/Immunologic: Negative.    Please see the history of present illness.     All other systems reviewed and are negative.  EKGs/Labs/Other Studies Reviewed:    The following studies were reviewed today:    Recent Labs: 12/30/2017: ALT 16; BUN 11; Creat 0.95; Hemoglobin 14.4; Magnesium 1.9; Platelets 289; Potassium 4.5; Sodium 141; TSH 2.05  Recent Lipid Panel    Component Value Date/Time   CHOL 239 (H) 12/30/2017 1031   TRIG 181 (H) 12/30/2017 1031   HDL 52 12/30/2017 1031   CHOLHDL 4.6 12/30/2017 1031   VLDL 30 11/22/2015 1557   LDLCALC 155 (H) 12/30/2017 1031    Physical Exam:    VS:  BP (!) 142/96 (BP Location: Left Arm, Patient Position: Sitting, Cuff Size: Large)   Pulse 76   Ht 5\' 7"  (1.702 m)   Wt 182 lb 12.8 oz (82.9 kg)   SpO2 98%   BMI 28.63 kg/m     Wt Readings from Last 3 Encounters:  01/05/18 182 lb 12.8 oz (82.9 kg)  12/30/17 182 lb  (82.6 kg)  07/16/17 183 lb 6.4 oz (83.2 kg)     GEN: She is tearful and looks somewhat chronically ill joint deformities in the upper extremities are noted well nourished, well developed in no acute distress HEENT: Normal NECK: No JVD; No carotid bruits LYMPHATICS: No lymphadenopathy CARDIAC: RRR, no murmurs, rubs, gallops RESPIRATORY:  Clear to auscultation without rales, wheezing or rhonchi  ABDOMEN: Soft, non-tender, non-distended MUSCULOSKELETAL:  No edema; No deformity  SKIN: Warm and dry NEUROLOGIC:  Alert and oriented x 3 PSYCHIATRIC:  Normal affect     Signed, Shirlee More, MD  01/05/2018 4:18 PM    [facility]

## 2018-01-05 ENCOUNTER — Encounter: Payer: Self-pay | Admitting: Cardiology

## 2018-01-05 ENCOUNTER — Ambulatory Visit (INDEPENDENT_AMBULATORY_CARE_PROVIDER_SITE_OTHER): Payer: 59 | Admitting: Cardiology

## 2018-01-05 ENCOUNTER — Other Ambulatory Visit: Payer: 59

## 2018-01-05 ENCOUNTER — Ambulatory Visit
Admission: RE | Admit: 2018-01-05 | Discharge: 2018-01-05 | Disposition: A | Discharge status: Home / Self Care | Payer: 59 | Source: Ambulatory Visit | Attending: Physician Assistant | Admitting: Physician Assistant

## 2018-01-05 VITALS — BP 142/96 | HR 76 | Ht 67.0 in | Wt 182.8 lb

## 2018-01-05 DIAGNOSIS — R0602 Shortness of breath: Secondary | ICD-10-CM | POA: Diagnosis not present

## 2018-01-05 DIAGNOSIS — R9431 Abnormal electrocardiogram [ECG] [EKG]: Secondary | ICD-10-CM

## 2018-01-05 DIAGNOSIS — I10 Essential (primary) hypertension: Secondary | ICD-10-CM

## 2018-01-05 DIAGNOSIS — I209 Angina pectoris, unspecified: Secondary | ICD-10-CM | POA: Diagnosis not present

## 2018-01-05 DIAGNOSIS — E785 Hyperlipidemia, unspecified: Secondary | ICD-10-CM

## 2018-01-05 DIAGNOSIS — R1011 Right upper quadrant pain: Secondary | ICD-10-CM

## 2018-01-05 DIAGNOSIS — K7689 Other specified diseases of liver: Secondary | ICD-10-CM | POA: Diagnosis not present

## 2018-01-05 MED ORDER — METOPROLOL TARTRATE 50 MG PO TABS: 50.0000 mg | ORAL_TABLET | Freq: Once | ORAL | 0 refills | Status: DC

## 2018-01-05 MED ORDER — DILTIAZEM HCL ER COATED BEADS 240 MG PO CP24: 240.0000 mg | ORAL_CAPSULE | Freq: Every day | ORAL | 3 refills | Status: DC

## 2018-01-05 MED ORDER — ROSUVASTATIN CALCIUM 10 MG PO TABS: 10.0000 mg | ORAL_TABLET | Freq: Every day | ORAL | 3 refills | Status: DC

## 2018-01-05 MED ORDER — ASPIRIN EC 81 MG PO TBEC: 81.0000 mg | DELAYED_RELEASE_TABLET | Freq: Every day | ORAL | 3 refills | Status: DC

## 2018-01-05 MED ORDER — NITROGLYCERIN 0.4 MG SL SUBL: 0.4000 mg | SUBLINGUAL_TABLET | SUBLINGUAL | 11 refills | Status: DC | PRN

## 2018-01-05 NOTE — Addendum Note (Signed)
Addended by: Austin Miles on: 01/05/2018 04:52 PM   Modules accepted: Orders

## 2018-01-05 NOTE — Patient Instructions (Addendum)
Medication Instructions:  Your physician has recommended you make the following change in your medication:  START aspirin 81 mg: take 1 tablet daily START rosuvastatin (crestor) 10 mg: take 1 tablet daily START diltiazem (cardizem CD) 240 mg: Take 1 capsule daily   START nitroglycerin as needed for chest pain: When having chest pain, stop what you are doing and sit down. Take 1 nitro, wait 5 minutes. Still having chest pain, take 1 nitro, wait 5 minutes. Still having chest pain, take 1 nitro, dial 911. Total of 3 nitro in 15 minutes.   If you need a refill on your cardiac medications before your next appointment, please call your pharmacy.   Lab work: Your physician recommends that you return for lab work today: BMP, troponin I.   If you have labs (blood work) drawn today and your tests are completely normal, you will receive your results only by: Marland Kitchen MyChart Message (if you have MyChart) OR . A paper copy in the mail If you have any lab test that is abnormal or we need to change your treatment, we will call you to review the results.  Testing/Procedures: Your physician has requested that you have cardiac CT. Cardiac computed tomography (CT) is a painless test that uses an x-ray machine to take clear, detailed pictures of your heart. For further information please visit [email or web address]. Please follow instruction sheet as given.   Please arrive at the [facility] main entrance of [facility] at xx:xx AM (30-45 minutes prior to test start time)  [address] [facility] [place], [place] [address] [phone]  Proceed to the [facility] Radiology Department (First Floor).  Please follow these instructions carefully (unless otherwise directed):  On the Night Before the Test: . Be sure to Drink plenty of water. . Do not consume any caffeinated/decaffeinated beverages or chocolate 12 hours prior to your test. . Do not take any antihistamines 12 hours prior to  your test.  On the Day of the Test: . Drink plenty of water. Do not drink any water within one hour of the test. . Do not eat any food 4 hours prior to the test. . You may take your regular medications prior to the test.  . Take metoprolol (Lopressor) two hours prior to test.                  -If HR is less than 55 BPM- No Beta Blocker                -IF HR is greater than 55 BPM and patient is less than or equal to 30 yrs old Lopressor 100mg  x1.         After the Test: . Drink plenty of water. . After receiving IV contrast, you may experience a mild flushed feeling. This is normal. . On occasion, you may experience a mild rash up to 24 hours after the test. This is not dangerous. If this occurs, you can take Benadryl 25 mg and increase your fluid intake. . If you experience trouble breathing, this can be serious. If it is severe call 911 IMMEDIATELY. If it is mild, please call our office.   Follow-Up: At [facility], you and your health needs are our priority.  As part of our continuing mission to provide you with exceptional heart care, we have created designated Provider Care Teams.  These Care Teams include your primary Cardiologist (physician) and Advanced Practice Providers (APPs -  Physician Assistants and  Nurse Practitioners) who all work together to provide you with the care you need, when you need it. . Your physician recommends that you schedule a follow-up appointment in 1 month.      Cardiac CT Angiogram A cardiac CT angiogram is a procedure to look at the heart and the area around the heart. It may be done to help find the cause of chest pains or other symptoms of heart disease. During this procedure, a large X-ray machine, called a CT scanner, takes detailed pictures of the heart and the surrounding area after a dye (contrast material) has been injected into blood vessels in the area. The procedure is also sometimes called a coronary CT angiogram, coronary artery  scanning, or CTA. A cardiac CT angiogram allows the health care provider to see how well blood is flowing to and from the heart. The health care provider will be able to see if there are any problems, such as:  Blockage or narrowing of the coronary arteries in the heart.  Fluid around the heart.  Signs of weakness or disease in the muscles, valves, and tissues of the heart.  Tell a health care provider about:  Any allergies you have. This is especially important if you have had a previous allergic reaction to contrast dye.  All medicines you are taking, including vitamins, herbs, eye drops, creams, and over-the-counter medicines.  Any blood disorders you have.  Any surgeries you have had.  Any medical conditions you have.  Whether you are pregnant or may be pregnant.  Any anxiety disorders, chronic pain, or other conditions you have that may increase your stress or prevent you from lying still. What are the risks? Generally, this is a safe procedure. However, problems may occur, including:  Bleeding.  Infection.  Allergic reactions to medicines or dyes.  Damage to other structures or organs.  Kidney damage from the dye or contrast that is used.  Increased risk of cancer from radiation exposure. This risk is low. Talk with your health care provider about: ? The risks and benefits of testing. ? How you can receive the lowest dose of radiation.  What happens before the procedure?  Wear comfortable clothing and remove any jewelry, glasses, dentures, and hearing aids.  Follow instructions from your health care provider about eating and drinking. This may include: ? For 12 hours before the test - avoid caffeine. This includes tea, coffee, soda, energy drinks, and diet pills. Drink plenty of water or other fluids that do not have caffeine in them. Being well-hydrated can prevent complications. ? For 4-6 hours before the test - stop eating and drinking. The contrast dye can  cause nausea, but this is less likely if your stomach is empty.  Ask your health care provider about changing or stopping your regular medicines. This is especially important if you are taking diabetes medicines, blood thinners, or medicines to treat erectile dysfunction. What happens during the procedure?  Hair on your chest may need to be removed so that small sticky patches called electrodes can be placed on your chest. These will transmit information that helps to monitor your heart during the test.  An IV tube will be inserted into one of your veins.  You might be given a medicine to control your heart rate during the test. This will help to ensure that good images are obtained.  You will be asked to lie on an exam table. This table will slide in and out of the CT machine during the  procedure.  Contrast dye will be injected into the IV tube. You might feel warm, or you may get a metallic taste in your mouth.  You will be given a medicine (nitroglycerin) to relax (dilate) the arteries in your heart.  The table that you are lying on will move into the CT machine tunnel for the scan.  The person running the machine will give you instructions while the scans are being done. You may be asked to: ? Keep your arms above your head. ? Hold your breath. ? Stay very still, even if the table is moving.  When the scanning is complete, you will be moved out of the machine.  The IV tube will be removed. The procedure may vary among health care providers and hospitals. What happens after the procedure?  You might feel warm, or you may get a metallic taste in your mouth from the contrast dye.  You may have a headache from the nitroglycerin.  After the procedure, drink water or other fluids to wash (flush) the contrast material out of your body.  Contact a health care provider if you have any symptoms of allergy to the contrast. These symptoms include: ? Shortness of breath. ? Rash or  hives. ? A racing heartbeat.  Most people can return to their normal activities right after the procedure. Ask your health care provider what activities are safe for you.  It is up to you to get the results of your procedure. Ask your health care provider, or the department that is doing the procedure, when your results will be ready. Summary  A cardiac CT angiogram is a procedure to look at the heart and the area around the heart. It may be done to help find the cause of chest pains or other symptoms of heart disease.  During this procedure, a large X-ray machine, called a CT scanner, takes detailed pictures of the heart and the surrounding area after a dye (contrast material) has been injected into blood vessels in the area.  Ask your health care provider about changing or stopping your regular medicines before the procedure. This is especially important if you are taking diabetes medicines, blood thinners, or medicines to treat erectile dysfunction.  After the procedure, drink water or other fluids to wash (flush) the contrast material out of your body. This information is not intended to replace advice given to you by your health care provider. Make sure you discuss any questions you have with your health care provider. Document Released: 02/08/2008 Document Revised: 01/15/2016 Document Reviewed: 01/15/2016 Elsevier Interactive Patient Education  2017 Elsevier Inc.     Aspirin and Your Heart Aspirin is a medicine that affects the way blood clots. Aspirin can be used to help reduce the risk of blood clots, heart attacks, and other heart-related problems. Should I take aspirin? Your health care provider will help you determine whether it is safe and beneficial for you to take aspirin daily. Taking aspirin daily may be beneficial if you:  Have had a heart attack or chest pain.  Have undergone open heart surgery such as coronary artery bypass surgery (CABG).  Have had coronary  angioplasty.  Have experienced a stroke or transient ischemic attack (TIA).  Have peripheral vascular disease (PVD).  Have chronic heart rhythm problems such as atrial fibrillation.  Are there any risks of taking aspirin daily? Daily use of aspirin can increase your risk of side effects. Some of these include:  Bleeding. Bleeding problems can be minor or serious.  An example of a minor problem is a cut that does not stop bleeding. An example of a more serious problem is stomach bleeding or bleeding into the brain. Your risk of bleeding is increased if you are also taking non-steroidal anti-inflammatory medicine (NSAIDs).  Increased bruising.  Upset stomach.  An allergic reaction. People who have nasal polyps have an increased risk of developing an aspirin allergy.  What are some guidelines I should follow when taking aspirin?  Take aspirin only as directed by your health care provider. Make sure you understand how much you should take and what form you should take. The two forms of aspirin are: ? Non-enteric-coated. This type of aspirin does not have a coating and is absorbed quickly. Non-enteric-coated aspirin is usually recommended for people with chest pain. This type of aspirin also comes in a chewable form. ? Enteric-coated. This type of aspirin has a special coating that releases the medicine very slowly. Enteric-coated aspirin causes less stomach upset than non-enteric-coated aspirin. This type of aspirin should not be chewed or crushed.  Drink alcohol in moderation. Drinking alcohol increases your risk of bleeding. When should I seek medical care?  You have unusual bleeding or bruising.  You have stomach pain.  You have an allergic reaction. Symptoms of an allergic reaction include: ? Hives. ? Itchy skin. ? Swelling of the lips, tongue, or face.  You have ringing in your ears. When should I seek immediate medical care?  Your bowel movements are bloody, dark red, or  black in color.  You vomit or cough up blood.  You have blood in your urine.  You cough, wheeze, or feel short of breath. If you have any of the following symptoms, this is an emergency. Do not wait to see if the pain will go away. Get medical help at once. Call your local emergency services (911 in the [place] Do not drive yourself to the hospital.  You have severe chest pain, especially if the pain is crushing or pressure-like and spreads to the arms, back, neck, or jaw.  You have stroke-like symptoms, such as: ? Loss of vision. ? Difficulty talking. ? Numbness or weakness on one side of your body. ? Numbness or weakness in your arm or leg. ? Not thinking clearly or feeling confused.  This information is not intended to replace advice given to you by your health care provider. Make sure you discuss any questions you have with your health care provider. Document Released: 02/08/2008 Document Revised: 07/05/2015 Document Reviewed: 06/02/2013 Elsevier Interactive Patient Education  2018 [place].    Rosuvastatin Tablets What is this medicine? ROSUVASTATIN (roe SOO va sta tin) is known as a HMG-CoA reductase inhibitor or 'statin'. It lowers cholesterol and triglycerides in the blood. This drug may also reduce the risk of heart attack, stroke, or other health problems in patients with risk factors for heart disease. Diet and lifestyle changes are often used with this drug. This medicine may be used for other purposes; ask your health care provider or pharmacist if you have questions. COMMON BRAND NAME(S): Crestor What should I tell my health care provider before I take this medicine? They need to know if you have any of these conditions: -frequently drink alcoholic beverages -kidney disease -liver disease -muscle aches or weakness -other medical condition -an unusual or allergic reaction to rosuvastatin, other medicines, foods, dyes, or preservatives -pregnant or trying to get  pregnant -breast-feeding How should I use this medicine? Take this medicine by mouth with  a glass of water. Follow the directions on the prescription label. Do not cut, crush or chew this medicine. You can take this medicine with or without food. Take your doses at regular intervals. Do not take your medicine more often than directed. Talk to your pediatrician regarding the use of this medicine in children. While this drug may be prescribed for children as young as 52 years old for selected conditions, precautions do apply. Overdosage: If you think you have taken too much of this medicine contact a poison control center or emergency room at once. NOTE: This medicine is only for you. Do not share this medicine with others. What if I miss a dose? If you miss a dose, take it as soon as you can. Do not take 2 doses within 12 hours of each other. If there are less than 12 hours until your next dose, take only that dose. Do not take double or extra doses. What may interact with this medicine? Do not take this medicine with any of the following medications: -herbal medicines like red yeast rice This medicine may also interact with the following medications: -alcohol -antacids containing aluminum hydroxide or magnesium hydroxide -cyclosporine -other medicines for high cholesterol -some medicines for HIV infection -warfarin This list may not describe all possible interactions. Give your health care provider a list of all the medicines, herbs, non-prescription drugs, or dietary supplements you use. Also tell them if you smoke, drink alcohol, or use illegal drugs. Some items may interact with your medicine. What should I watch for while using this medicine? Visit your doctor or health care professional for regular check-ups. You may need regular tests to make sure your liver is working properly. Tell your doctor or health care professional right away if you get any unexplained muscle pain, tenderness, or  weakness, especially if you also have a fever and tiredness. Your doctor or health care professional may tell you to stop taking this medicine if you develop muscle problems. If your muscle problems do not go away after stopping this medicine, contact your health care professional. This medicine may affect blood sugar levels. If you have diabetes, check with your doctor or health care professional before you change your diet or the dose of your diabetic medicine. Avoid taking antacids containing aluminum, calcium or magnesium within 2 hours of taking this medicine. This drug is only part of a total heart-health program. Your doctor or a dietician can suggest a low-cholesterol and low-fat diet to help. Avoid alcohol and smoking, and keep a proper exercise schedule. Do not use this drug if you are pregnant or breast-feeding. Serious side effects to an unborn child or to an infant are possible. Talk to your doctor or pharmacist for more information. What side effects may I notice from receiving this medicine? Side effects that you should report to your doctor or health care professional as soon as possible: -allergic reactions like skin rash, itching or hives, swelling of the face, lips, or tongue -dark urine -fever -joint pain -muscle cramps, pain -redness, blistering, peeling or loosening of the skin, including inside the mouth -trouble passing urine or change in the amount of urine -unusually weak or tired -yellowing of the eyes or skin Side effects that usually do not require medical attention (report to your doctor or health care professional if they continue or are bothersome): -constipation -heartburn -nausea -stomach gas, pain, upset This list may not describe all possible side effects. Call your doctor for medical advice about side effects.  You may report side effects to FDA at 1-800-FDA-1088. Where should I keep my medicine? Keep out of the reach of children. Store at room temperature  between 20 and 25 degrees C (68 and 77 degrees F). Keep container tightly closed (protect from moisture). Throw away any unused medicine after the expiration date. NOTE: This sheet is a summary. It may not cover all possible information. If you have questions about this medicine, talk to your doctor, pharmacist, or health care provider.  2018 Elsevier/Gold Standard (2014-08-11 13:33:08)    Diltiazem extended-release capsules or tablets What is this medicine? DILTIAZEM (dil TYE a zem) is a calcium-channel blocker. It affects the amount of calcium found in your heart and muscle cells. This relaxes your blood vessels, which can reduce the amount of work the heart has to do. This medicine is used to treat high blood pressure and chest pain caused by angina. This medicine may be used for other purposes; ask your health care provider or pharmacist if you have questions. COMMON BRAND NAME(S): Cardizem CD, Cardizem LA, Cardizem SR, Cartia XT, Dilacor XR, Dilt-CD, Diltia XT, Diltzac, Matzim LA, Rema Fendt, Tiamate, Tiazac What should I tell my health care provider before I take this medicine? They need to know if you have any of these conditions: -heart problems, low blood pressure, irregular heartbeat -liver disease -previous heart attack -an unusual or allergic reaction to diltiazem, other medicines, foods, dyes, or preservatives -pregnant or trying to get pregnant -breast-feeding How should I use this medicine? Take this medicine by mouth with a glass of water. Follow the directions on the prescription label. Swallow whole, do not crush or chew. Ask your doctor or pharmacist if your should take this medicine with food. Take your doses at regular intervals. Do not take your medicine more often then directed. Do not stop taking except on the advice of your doctor or health care professional. Ask your doctor or health care professional how to gradually reduce the dose. Talk to your pediatrician regarding  the use of this medicine in children. Special care may be needed. Overdosage: If you think you have taken too much of this medicine contact a poison control center or emergency room at once. NOTE: This medicine is only for you. Do not share this medicine with others. What if I miss a dose? If you miss a dose, take it as soon as you can. If it is almost time for your next dose, take only that dose. Do not take double or extra doses. What may interact with this medicine? Do not take this medicine with any of the following medications: -cisapride -hawthorn -pimozide -ranolazine -red yeast rice This medicine may also interact with the following medications: -buspirone -carbamazepine -cimetidine -cyclosporine -digoxin -local anesthetics or general anesthetics -lovastatin -medicines for anxiety or difficulty sleeping like midazolam and triazolam -medicines for high blood pressure or heart problems -quinidine -rifampin, rifabutin, or rifapentine This list may not describe all possible interactions. Give your health care provider a list of all the medicines, herbs, non-prescription drugs, or dietary supplements you use. Also tell them if you smoke, drink alcohol, or use illegal drugs. Some items may interact with your medicine. What should I watch for while using this medicine? Check your blood pressure and pulse rate regularly. Ask your doctor or health care professional what your blood pressure and pulse rate should be and when you should contact him or her. You may feel dizzy or lightheaded. Do not drive, use machinery, or do anything that  needs mental alertness until you know how this medicine affects you. To reduce the risk of dizzy or fainting spells, do not sit or stand up quickly, especially if you are an older patient. Alcohol can make you more dizzy or increase flushing and rapid heartbeats. Avoid alcoholic drinks. What side effects may I notice from receiving this medicine? Side  effects that you should report to your doctor or health care professional as soon as possible: -allergic reactions like skin rash, itching or hives, swelling of the face, lips, or tongue -confusion, mental depression -feeling faint or lightheaded, falls -redness, blistering, peeling or loosening of the skin, including inside the mouth -slow, irregular heartbeat -swelling of the feet and ankles -unusual bleeding or bruising, pinpoint red spots on the skin Side effects that usually do not require medical attention (report to your doctor or health care professional if they continue or are bothersome): -constipation or diarrhea -difficulty sleeping -facial flushing -headache -nausea, vomiting -sexual dysfunction -weak or tired This list may not describe all possible side effects. Call your doctor for medical advice about side effects. You may report side effects to FDA at 1-800-FDA-1088. Where should I keep my medicine? Keep out of the reach of children. Store at room temperature between 15 and 30 degrees C (59 and 86 degrees F). Protect from humidity. Throw away any unused medicine after the expiration date. NOTE: This sheet is a summary. It may not cover all possible information. If you have questions about this medicine, talk to your doctor, pharmacist, or health care provider.  2018 Elsevier/Gold Standard (2007-06-18 14:35:47)    Nitroglycerin sublingual tablets What is this medicine? NITROGLYCERIN (nye troe GLI ser in) is a type of vasodilator. It relaxes blood vessels, increasing the blood and oxygen supply to your heart. This medicine is used to relieve chest pain caused by angina. It is also used to prevent chest pain before activities like climbing stairs, going outdoors in cold weather, or sexual activity. This medicine may be used for other purposes; ask your health care provider or pharmacist if you have questions. COMMON BRAND NAME(S): Nitroquick, Nitrostat, Nitrotab What  should I tell my health care provider before I take this medicine? They need to know if you have any of these conditions: -anemia -head injury, recent stroke, or bleeding in the brain -liver disease -previous heart attack -an unusual or allergic reaction to nitroglycerin, other medicines, foods, dyes, or preservatives -pregnant or trying to get pregnant -breast-feeding How should I use this medicine? Take this medicine by mouth as needed. At the first sign of an angina attack (chest pain or tightness) place one tablet under your tongue. You can also take this medicine 5 to 10 minutes before an event likely to produce chest pain. Follow the directions on the prescription label. Let the tablet dissolve under the tongue. Do not swallow whole. Replace the dose if you accidentally swallow it. It will help if your mouth is not dry. Saliva around the tablet will help it to dissolve more quickly. Do not eat or drink, smoke or chew tobacco while a tablet is dissolving. If you are not better within 5 minutes after taking ONE dose of nitroglycerin, call 9-1-1 immediately to seek emergency medical care. Do not take more than 3 nitroglycerin tablets over 15 minutes. If you take this medicine often to relieve symptoms of angina, your doctor or health care professional may provide you with different instructions to manage your symptoms. If symptoms do not go away after following these  instructions, it is important to call 9-1-1 immediately. Do not take more than 3 nitroglycerin tablets over 15 minutes. Talk to your pediatrician regarding the use of this medicine in children. Special care may be needed. Overdosage: If you think you have taken too much of this medicine contact a poison control center or emergency room at once. NOTE: This medicine is only for you. Do not share this medicine with others. What if I miss a dose? This does not apply. This medicine is only used as needed. What may interact with this  medicine? Do not take this medicine with any of the following medications: -certain migraine medicines like ergotamine and dihydroergotamine (DHE) -medicines used to treat erectile dysfunction like sildenafil, tadalafil, and vardenafil -riociguat This medicine may also interact with the following medications: -alteplase -aspirin -heparin -medicines for high blood pressure -medicines for mental depression -other medicines used to treat angina -phenothiazines like chlorpromazine, mesoridazine, prochlorperazine, thioridazine This list may not describe all possible interactions. Give your health care provider a list of all the medicines, herbs, non-prescription drugs, or dietary supplements you use. Also tell them if you smoke, drink alcohol, or use illegal drugs. Some items may interact with your medicine. What should I watch for while using this medicine? Tell your doctor or health care professional if you feel your medicine is no longer working. Keep this medicine with you at all times. Sit or lie down when you take your medicine to prevent falling if you feel dizzy or faint after using it. Try to remain calm. This will help you to feel better faster. If you feel dizzy, take several deep breaths and lie down with your feet propped up, or bend forward with your head resting between your knees. You may get drowsy or dizzy. Do not drive, use machinery, or do anything that needs mental alertness until you know how this drug affects you. Do not stand or sit up quickly, especially if you are an older patient. This reduces the risk of dizzy or fainting spells. Alcohol can make you more drowsy and dizzy. Avoid alcoholic drinks. Do not treat yourself for coughs, colds, or pain while you are taking this medicine without asking your doctor or health care professional for advice. Some ingredients may increase your blood pressure. What side effects may I notice from receiving this medicine? Side effects that  you should report to your doctor or health care professional as soon as possible: -blurred vision -dry mouth -skin rash -sweating -the feeling of extreme pressure in the head -unusually weak or tired Side effects that usually do not require medical attention (report to your doctor or health care professional if they continue or are bothersome): -flushing of the face or neck -headache -irregular heartbeat, palpitations -nausea, vomiting This list may not describe all possible side effects. Call your doctor for medical advice about side effects. You may report side effects to FDA at 1-800-FDA-1088. Where should I keep my medicine? Keep out of the reach of children. Store at room temperature between 20 and 25 degrees C (68 and 77 degrees F). Store in Chief of Staff. Protect from light and moisture. Keep tightly closed. Throw away any unused medicine after the expiration date. NOTE: This sheet is a summary. It may not cover all possible information. If you have questions about this medicine, talk to your doctor, pharmacist, or health care provider.  2018 Elsevier/Gold Standard (2012-12-24 17:57:36)

## 2018-01-06 LAB — BASIC METABOLIC PANEL
BUN/Creatinine Ratio: 13 (ref 9–23)
BUN: 11 mg/dL (ref 6–24)
CHLORIDE: 104 mmol/L (ref 96–106)
CO2: 26 mmol/L (ref 20–29)
CREATININE: 0.83 mg/dL (ref 0.57–1.00)
Calcium: 10.1 mg/dL (ref 8.7–10.2)
GFR calc Af Amer: 91 mL/min/{1.73_m2} (ref >59)
GFR calc non Af Amer: 79 mL/min/{1.73_m2} (ref >59)
GLUCOSE: 95 mg/dL (ref 65–99)
Potassium: 3.9 mmol/L (ref 3.5–5.2)
SODIUM: 145 mmol/L — AB (ref 134–144)

## 2018-01-06 LAB — TROPONIN I

## 2018-01-19 DIAGNOSIS — M1711 Unilateral primary osteoarthritis, right knee: Secondary | ICD-10-CM | POA: Diagnosis not present

## 2018-02-03 ENCOUNTER — Telehealth: Payer: Self-pay | Admitting: Cardiology

## 2018-02-03 ENCOUNTER — Other Ambulatory Visit: Payer: Self-pay | Admitting: Physician Assistant

## 2018-02-03 DIAGNOSIS — R42 Dizziness and giddiness: Secondary | ICD-10-CM

## 2018-02-03 MED ORDER — DIAZEPAM 2 MG PO TABS: 2.0000 mg | ORAL_TABLET | Freq: Two times a day (BID) | ORAL | 0 refills | Status: DC | PRN

## 2018-02-03 NOTE — Telephone Encounter (Signed)
Still has not been scheduled for CT, please advise

## 2018-02-03 NOTE — Progress Notes (Signed)
Future Appointments  Date Time Provider [place]  02/16/2018  4:00 PM Richardo Priest, MD [facility] None  07/01/2018  9:30 AM Vicie Mutters, PA-C [facility] None  01/07/2019  9:00 AM Vicie Mutters, PA-C [facility] None

## 2018-02-04 NOTE — Telephone Encounter (Signed)
Advised patient that I have reached out to our cardiac CTA scheduler to check on the status of scheduling testing. Informed her I would update her as soon as I heard back. Patient verbalized understanding. No further questions.

## 2018-02-11 ENCOUNTER — Telehealth: Payer: Self-pay

## 2018-02-11 DIAGNOSIS — I10 Essential (primary) hypertension: Secondary | ICD-10-CM

## 2018-02-11 NOTE — Telephone Encounter (Signed)
See most recent encounter.  

## 2018-02-11 NOTE — Telephone Encounter (Signed)
Patient advised that she is scheduled for cardiac CTA on 02-23-18, and she will need BMP one week prior to test.  Patient agreed to plan and verbalized understanding.

## 2018-02-15 NOTE — Progress Notes (Deleted)
Cardiology Office Note:    Date:  02/15/2018   ID:  Nicole Phelps, DOB March 30, 1960, MRN [id number]  PCP:  Unk Pinto, MD  Cardiologist:  Shirlee More, MD    Referring MD: Unk Pinto, MD    ASSESSMENT:    No diagnosis found. PLAN:    In order of problems listed above:  1. ***   Next appointment: ***   Medication Adjustments/Labs and Tests Ordered: Current medicines are reviewed at length with the patient today.  Concerns regarding medicines are outlined above.  No orders of the defined types were placed in this encounter.  No orders of the defined types were placed in this encounter.   No chief complaint on file.   History of Present Illness:    Nicole Phelps is a 57 y.o. female with a hx of Abnormal EKG chest pain and SOB last seen 01/05/18. Compliance with diet, lifestyle and medications: *** Past Medical History:  Diagnosis Date  . Allergy   . Arthritis   . Asthma   . Benign hematuria 2004  . Depression   . Hyperlipidemia   . Hypertension   . TMJ (temporomandibular joint syndrome)   . Vitamin D deficiency     Past Surgical History:  Procedure Laterality Date  . ABDOMINAL HYSTERECTOMY    . ANKLE SURGERY Left 2017  . BREAST SURGERY Left    lumpectomy benign  . KNEE ARTHROSCOPY W/ DEBRIDEMENT Right 11/2017  . TUBAL LIGATION      Current Medications: No outpatient medications have been marked as taking for the 02/16/18 encounter (Appointment) with Richardo Priest, MD.     Allergies:   Other; Azithromycin; Celexa [citalopram hydrobromide]; Trintellix [vortioxetine]; Vicodin [hydrocodone-acetaminophen]; and Wellbutrin [bupropion]   Social History   Socioeconomic History  . Marital status: Married    Spouse name: Not on file  . Number of children: Not on file  . Years of education: Not on file  . Highest education level: Not on file  Occupational History  . Not on file  Social Needs  . Financial resource strain: Not on file  . Food  insecurity:    Worry: Not on file    Inability: Not on file  . Transportation needs:    Medical: Not on file    Non-medical: Not on file  Tobacco Use  . Smoking status: Current Every Day Smoker    Packs/day: 0.50    Years: 25.00    Pack years: 12.50  . Smokeless tobacco: Never Used  Substance and Sexual Activity  . Alcohol use: Not Currently  . Drug use: Yes    Types: Marijuana  . Sexual activity: Yes  Lifestyle  . Physical activity:    Days per week: Not on file    Minutes per session: Not on file  . Stress: Not on file  Relationships  . Social connections:    Talks on phone: Not on file    Gets together: Not on file    Attends religious service: Not on file    Active member of club or organization: Not on file    Attends meetings of clubs or organizations: Not on file    Relationship status: Not on file  Other Topics Concern  . Not on file  Social History Narrative  . Not on file     Family History: The patient's ***family history includes Arthritis in her mother; Breast cancer in her sister; Cancer in her mother; Hypertension in her father; Stroke in her father.  ROS:   Please see the history of present illness.    All other systems reviewed and are negative.  EKGs/Labs/Other Studies Reviewed:    The following studies were reviewed today:  EKG:  EKG ordered today.  The ekg ordered today demonstrates ***  Recent Labs: 12/30/2017: ALT 16; Hemoglobin 14.4; Magnesium 1.9; Platelets 289; TSH 2.05 01/05/2018: BUN 11; Creatinine, Ser 0.83; Potassium 3.9; Sodium 145  Recent Lipid Panel    Component Value Date/Time   CHOL 239 (H) 12/30/2017 1031   TRIG 181 (H) 12/30/2017 1031   HDL 52 12/30/2017 1031   CHOLHDL 4.6 12/30/2017 1031   VLDL 30 11/22/2015 1557   LDLCALC 155 (H) 12/30/2017 1031    Physical Exam:    VS:  There were no vitals taken for this visit.    Wt Readings from Last 3 Encounters:  01/05/18 182 lb 12.8 oz (82.9 kg)  12/30/17 182 lb (82.6 kg)    07/16/17 183 lb 6.4 oz (83.2 kg)     GEN: *** Well nourished, well developed in no acute distress HEENT: Normal NECK: No JVD; No carotid bruits LYMPHATICS: No lymphadenopathy CARDIAC: ***RRR, no murmurs, rubs, gallops RESPIRATORY:  Clear to auscultation without rales, wheezing or rhonchi  ABDOMEN: Soft, non-tender, non-distended MUSCULOSKELETAL:  No edema; No deformity  SKIN: Warm and dry NEUROLOGIC:  Alert and oriented x 3 PSYCHIATRIC:  Normal affect    Signed, Shirlee More, MD  02/15/2018 9:47 AM    [place]

## 2018-02-16 ENCOUNTER — Ambulatory Visit: Payer: 59 | Admitting: Cardiology

## 2018-02-17 DIAGNOSIS — I10 Essential (primary) hypertension: Secondary | ICD-10-CM | POA: Diagnosis not present

## 2018-02-18 LAB — BASIC METABOLIC PANEL
BUN/Creatinine Ratio: 15 (ref 9–23)
BUN: 15 mg/dL (ref 6–24)
CO2: 24 mmol/L (ref 20–29)
Calcium: 9.2 mg/dL (ref 8.7–10.2)
Chloride: 102 mmol/L (ref 96–106)
Creatinine, Ser: 0.99 mg/dL (ref 0.57–1.00)
GFR calc Af Amer: 73 mL/min/{1.73_m2} (ref >59)
GFR calc non Af Amer: 63 mL/min/{1.73_m2} (ref >59)
Glucose: 99 mg/dL (ref 65–99)
Potassium: 4 mmol/L (ref 3.5–5.2)
SODIUM: 140 mmol/L (ref 134–144)

## 2018-02-23 ENCOUNTER — Ambulatory Visit (HOSPITAL_COMMUNITY)
Admission: RE | Admit: 2018-02-23 | Discharge: 2018-02-23 | Disposition: A | Discharge status: Home / Self Care | Payer: 59 | Source: Ambulatory Visit | Attending: Cardiology | Admitting: Cardiology

## 2018-02-23 ENCOUNTER — Ambulatory Visit (HOSPITAL_COMMUNITY): Admission: RE | Admit: 2018-02-23 | Payer: 59 | Source: Ambulatory Visit

## 2018-02-23 DIAGNOSIS — R9431 Abnormal electrocardiogram [ECG] [EKG]: Secondary | ICD-10-CM | POA: Diagnosis not present

## 2018-02-23 DIAGNOSIS — I209 Angina pectoris, unspecified: Secondary | ICD-10-CM | POA: Insufficient documentation

## 2018-02-23 MED ORDER — NITROGLYCERIN 0.4 MG SL SUBL
SUBLINGUAL_TABLET | SUBLINGUAL | Status: AC
  Filled 2018-02-23: qty 2

## 2018-02-23 MED ORDER — NITROGLYCERIN 0.6 MG SL SUBL
0.6000 mg | SUBLINGUAL_TABLET | Freq: Once | SUBLINGUAL | Status: DC
  Filled 2018-02-23: qty 100

## 2018-02-23 MED ORDER — IOPAMIDOL (ISOVUE-370) INJECTION 76%
80.0000 mL | Freq: Once | INTRAVENOUS | Status: AC | PRN
  Administered 2018-02-23: 80 mL via INTRAVENOUS

## 2018-02-23 MED ORDER — NITROGLYCERIN 0.4 MG SL SUBL
0.8000 mg | SUBLINGUAL_TABLET | Freq: Once | SUBLINGUAL | Status: AC
  Administered 2018-02-23: 0.8 mg via SUBLINGUAL
  Filled 2018-02-23: qty 25

## 2018-03-05 NOTE — Progress Notes (Signed)
Cardiology Office Note:    Date:  03/06/2018   ID:  [name], DOB 10/23/1960, MRN [id number]  PCP:  Unk Pinto, MD  Cardiologist:  Shirlee More, MD    Referring MD: Unk Pinto, MD    ASSESSMENT:    1. Mild CAD   2. Bruises easily   3. Hyperlipidemia, unspecified hyperlipidemia type    PLAN:    In order of problems listed above:  1. Improved continue current medical treatment reduce aspirin to every other day with bruising along with beta-blocker nitroglycerin as needed and statin.  She is reassured by the findings of her cardiac CTA 2. Reduce aspirin to 3 days a week add multivitamin 3. Continue her statin she will need a follow-up lipid profile with her PCP   Next appointment: 1 year   Medication Adjustments/Labs and Tests Ordered: Current medicines are reviewed at length with the patient today.  Concerns regarding medicines are outlined above.  No orders of the defined types were placed in this encounter.  No orders of the defined types were placed in this encounter.   Chief Complaint  Patient presents with  . Follow-up    Cardiac CTA    History of Present Illness:    LEOLIA VINZANT is a 57 y.o. female with a hx of chest pain last seen 01/05/18.  Cardiac CTA 02/23/18: IMPRESSION: 1. Coronary artery calcium score 0.8 Agatston units. This places the patient in the 70th percentile for age and gender, suggesting intermediate risk for future cardiac events. 2.  Mild ostial LAD stenosis (nonobstructive)., 50% Compliance with diet, lifestyle and medications: chest  Past Medical History:  Diagnosis Date  . Allergy   . Arthritis   . Asthma   . Benign hematuria 2004  . Depression   . Hyperlipidemia   . Hypertension   . TMJ (temporomandibular joint syndrome)   . Vitamin D deficiency     Past Surgical History:  Procedure Laterality Date  . ABDOMINAL HYSTERECTOMY    . ANKLE SURGERY Left 2017  . BREAST SURGERY Left    lumpectomy benign  .  KNEE ARTHROSCOPY W/ DEBRIDEMENT Right 11/2017  . TUBAL LIGATION      Current Medications: Current Meds  Medication Sig  . albuterol (VENTOLIN HFA) 108 (90 Base) MCG/ACT inhaler USE 2 PUFFS EVERY 6 HOURS  AS NEEDED FOR SHORTNESS OF  BREATH  . aspirin EC 81 MG tablet Take 1 tablet (81 mg total) by mouth daily.  Marland Kitchen azelastine (ASTELIN) 0.1 % nasal spray Place 2 sprays into both nostrils 2 (two) times daily. Use in each nostril as directed  . cholecalciferol (VITAMIN D) 1000 UNITS tablet Take 2,000 Units by mouth daily.   . diazepam (VALIUM) 2 MG tablet Take 1 tablet (2 mg total) by mouth every 12 (twelve) hours as needed for anxiety (dizziness).  Marland Kitchen diclofenac (VOLTAREN) 75 MG EC tablet Take 75 mg by mouth 2 (two) times daily.  Marland Kitchen escitalopram (LEXAPRO) 20 MG tablet TAKE 1 TABLET BY MOUTH  DAILY  . fluticasone (FLONASE) 50 MCG/ACT nasal spray Place 1 spray into both nostrils daily.  . Fluticasone-Salmeterol (ADVAIR DISKUS) 100-50 MCG/DOSE AEPB USE 1 INHALATION TWICE  DAILY  . metoprolol tartrate (LOPRESSOR) 50 MG tablet Take 1 tablet (50 mg total) by mouth once for 1 dose.  . montelukast (SINGULAIR) 10 MG tablet TAKE 1 TABLET BY MOUTH  DAILY  . nitroGLYCERIN (NITROSTAT) 0.4 MG SL tablet Place 1 tablet (0.4 mg total) under the tongue every 5 (five)  minutes as needed for chest pain.  . rosuvastatin (CRESTOR) 10 MG tablet Take 1 tablet (10 mg total) by mouth daily.     Allergies:   Other; Azithromycin; Celexa [citalopram hydrobromide]; Trintellix [vortioxetine]; Vicodin [hydrocodone-acetaminophen]; and Wellbutrin [bupropion]   Social History   Socioeconomic History  . Marital status: Married    Spouse name: Not on file  . Number of children: Not on file  . Years of education: Not on file  . Highest education level: Not on file  Occupational History  . Not on file  Social Needs  . Financial resource strain: Not on file  . Food insecurity:    Worry: Not on file    Inability: Not on file    . Transportation needs:    Medical: Not on file    Non-medical: Not on file  Tobacco Use  . Smoking status: Current Every Day Smoker    Packs/day: 0.50    Years: 25.00    Pack years: 12.50  . Smokeless tobacco: Never Used  Substance and Sexual Activity  . Alcohol use: Not Currently  . Drug use: Yes    Types: Marijuana  . Sexual activity: Yes  Lifestyle  . Physical activity:    Days per week: Not on file    Minutes per session: Not on file  . Stress: Not on file  Relationships  . Social connections:    Talks on phone: Not on file    Gets together: Not on file    Attends religious service: Not on file    Active member of club or organization: Not on file    Attends meetings of clubs or organizations: Not on file    Relationship status: Not on file  Other Topics Concern  . Not on file  Social History Narrative  . Not on file     Family History: The patient's family history includes Arthritis in her mother; Breast cancer in her sister; Cancer in her mother; Hypertension in her father; Stroke in her father. ROS:   Please see the history of present illness.    All other systems reviewed and are negative.  EKGs/Labs/Other Studies Reviewed:    The following studies were reviewed today:  Recent Labs: 12/30/2017: ALT 16; Hemoglobin 14.4; Magnesium 1.9; Platelets 289; TSH 2.05 02/17/2018: BUN 15; Creatinine, Ser 0.99; Potassium 4.0; Sodium 140  Recent Lipid Panel    Component Value Date/Time   CHOL 239 (H) 12/30/2017 1031   TRIG 181 (H) 12/30/2017 1031   HDL 52 12/30/2017 1031   CHOLHDL 4.6 12/30/2017 1031   VLDL 30 11/22/2015 1557   LDLCALC 155 (H) 12/30/2017 1031    Physical Exam:    VS:  BP 120/82   Pulse 77   Ht 5\' 5"  (1.651 m)   Wt 189 lb 6.4 oz (85.9 kg)   SpO2 97%   BMI 31.52 kg/m     Wt Readings from Last 3 Encounters:  03/06/18 189 lb 6.4 oz (85.9 kg)  01/05/18 182 lb 12.8 oz (82.9 kg)  12/30/17 182 lb (82.6 kg)     GEN:  Well nourished, well  developed in no acute distress ecchymoses of hands HEENT: Normal NECK: No JVD; No carotid bruits LYMPHATICS: No lymphadenopathy CARDIAC: RRR, no murmurs, rubs, gallops RESPIRATORY:  Clear to auscultation without rales, wheezing or rhonchi  ABDOMEN: Soft, non-tender, non-distended MUSCULOSKELETAL:  No edema; No deformity  SKIN: Warm and dry NEUROLOGIC:  Alert and oriented x 3 PSYCHIATRIC:  Normal affect  Signed, Shirlee More, MD  03/06/2018 11:33 AM    [facility]

## 2018-03-06 ENCOUNTER — Ambulatory Visit (INDEPENDENT_AMBULATORY_CARE_PROVIDER_SITE_OTHER): Payer: 59 | Admitting: Cardiology

## 2018-03-06 ENCOUNTER — Encounter: Payer: Self-pay | Admitting: Cardiology

## 2018-03-06 VITALS — BP 120/82 | HR 77 | Ht 65.0 in | Wt 189.4 lb

## 2018-03-06 DIAGNOSIS — R238 Other skin changes: Secondary | ICD-10-CM

## 2018-03-06 DIAGNOSIS — E785 Hyperlipidemia, unspecified: Secondary | ICD-10-CM

## 2018-03-06 DIAGNOSIS — R233 Spontaneous ecchymoses: Secondary | ICD-10-CM | POA: Insufficient documentation

## 2018-03-06 DIAGNOSIS — I2510 Atherosclerotic heart disease of native coronary artery without angina pectoris: Secondary | ICD-10-CM | POA: Diagnosis not present

## 2018-03-06 MED ORDER — ASPIRIN EC 81 MG PO TBEC: DELAYED_RELEASE_TABLET | ORAL | 3 refills | Status: DC

## 2018-03-06 MED ORDER — MULTIVITAMINS PO CAPS: 1.0000 | ORAL_CAPSULE | Freq: Every day | ORAL | Status: DC

## 2018-03-06 NOTE — Patient Instructions (Signed)
Medication Instructions:  Your physician has recommended you make the following change in your medication:   DECREASE aspirin 81 mg: Take 1 tablet on Monday, Wednesday, and Friday  START over the counter multivitamin: Take 1 tablet daily   If you need a refill on your cardiac medications before your next appointment, please call your pharmacy.   Lab work: None  If you have labs (blood work) drawn today and your tests are completely normal, you will receive your results only by: Marland Kitchen MyChart Message (if you have MyChart) OR . A paper copy in the mail If you have any lab test that is abnormal or we need to change your treatment, we will call you to review the results.  Testing/Procedures: None  Follow-Up: At [facility], you and your health needs are our priority.  As part of our continuing mission to provide you with exceptional heart care, we have created designated Provider Care Teams.  These Care Teams include your primary Cardiologist (physician) and Advanced Practice Providers (APPs -  Physician Assistants and Nurse Practitioners) who all work together to provide you with the care you need, when you need it. You will need a follow up appointment in 1 years.  Please call our office 2 months in advance to schedule this appointment.

## 2018-03-26 DIAGNOSIS — M1711 Unilateral primary osteoarthritis, right knee: Secondary | ICD-10-CM | POA: Diagnosis not present

## 2018-04-02 ENCOUNTER — Telehealth: Payer: Self-pay

## 2018-04-02 NOTE — Telephone Encounter (Signed)
Left message for patient to return call.

## 2018-04-03 MED ORDER — ROSUVASTATIN CALCIUM 10 MG PO TABS: 10.0000 mg | ORAL_TABLET | Freq: Every day | ORAL | 3 refills | Status: DC

## 2018-04-03 MED ORDER — DILTIAZEM HCL ER COATED BEADS 240 MG PO CP24: 240.0000 mg | ORAL_CAPSULE | Freq: Every day | ORAL | 3 refills | Status: DC

## 2018-04-03 NOTE — Telephone Encounter (Signed)
Spoke with patient to verify if she is taking diltiazem.  Patient is currently taking diltiazem 240mg  one capsule daily. Rx was sent in as requested from pharmacy.

## 2018-04-29 ENCOUNTER — Telehealth: Payer: Self-pay

## 2018-04-29 NOTE — Addendum Note (Signed)
Addended by: Beckey Rutter on: 04/29/2018 04:51 PM   Modules accepted: Orders

## 2018-04-29 NOTE — Telephone Encounter (Signed)
Patient called to report that she woke up with a broken blood vessel in her left eye, has a headache, and developed "blood spots" all over her legs since shaving this morning. She states she is only taking 81mg  of aspirin on MWF due to increased bruising on her arms. Patient would like to speak with a nurse regarding these symptoms and her aspirin regimen.   Please advise.

## 2018-04-29 NOTE — Telephone Encounter (Signed)
Patient states she felt like "something was in her eye" yesterday. Experienced burning sensation so she used visine with no issues after. No other trauma to area.Today entire eye is blood shot.  Currently taking meds as prescribed. Does not have a way to check BP or HR but states she feels fine. Headache has subsided. No dizziness or blurred vision. Patient also states" she shaved her legs a couple days ago with a new razor and had streaks this morning." No other bruised areas on body or blood spots other than leg area. Patient is concerned these issues may be due to aspirin, will have Dr. Bettina Gavia advise.

## 2018-04-29 NOTE — Telephone Encounter (Signed)
RN return called patient to relay Dr. Joya Gaskins advice. Patient informed to stop aspirin and seek an eye doctor if eye issue does not resolve in next 24-48 hrs. Patient verbalized understanding, no further questions.

## 2018-04-29 NOTE — Telephone Encounter (Signed)
Stop aspirin  May need an eye exam if ongoing problem

## 2018-04-30 ENCOUNTER — Telehealth: Payer: Self-pay

## 2018-04-30 NOTE — Telephone Encounter (Signed)
-----   Message from Vicie Mutters, [place] sent at 04/30/2018 12:58 PM EST ----- Regarding: RE: question Daughter sent pictures, suggest stopping the ASA, and should make an office visit here for follow up tomorrow. If any nose bleeds, severe HA go to ER.  ----- Message ----- From: Elenor Quinones, CMA Sent: 04/29/2018   3:34 PM EST To: Vicie Mutters, PA-C Subject: question                                       Patient is taking asprin  Has headaches, eyes are blood shot red  Patient shaved a few days ago and now has a rash that look like chill bumps  Please advise

## 2018-04-30 NOTE — Telephone Encounter (Signed)
The patient has been notified of this information and all questions answered.  TRANSFERRED TO SCHEDULE APPT ON Monday (PER PATIENT)

## 2018-04-30 NOTE — Telephone Encounter (Signed)
The patient has been notified of this information and all questions answered.

## 2018-04-30 NOTE — Telephone Encounter (Signed)
-----   Message from Vicie Mutters, [place] sent at 04/30/2018 12:44 PM EST ----- Regarding: RE: question Can stop the aspirin for now. Do not shave, but cool wash clothes on the rash, should get better with time. Make OV Monday/Tuesday if not better patient to go to ER if there is weakness, thunderclap headache, visual changes, or any concerning factors ----- Message ----- From: Elenor Quinones, CMA Sent: 04/29/2018   3:34 PM EST To: Vicie Mutters, PA-C Subject: question                                       Patient is taking asprin  Has headaches, eyes are blood shot red  Patient shaved a few days ago and now has a rash that look like chill bumps  Please advise

## 2018-05-04 ENCOUNTER — Other Ambulatory Visit: Payer: Self-pay | Admitting: Cardiology

## 2018-05-04 ENCOUNTER — Encounter: Payer: Self-pay | Admitting: Physician Assistant

## 2018-05-04 ENCOUNTER — Ambulatory Visit (INDEPENDENT_AMBULATORY_CARE_PROVIDER_SITE_OTHER): Payer: 59 | Admitting: Physician Assistant

## 2018-05-04 VITALS — BP 130/80 | HR 65 | Temp 97.5°F | Ht 65.0 in | Wt 191.0 lb

## 2018-05-04 DIAGNOSIS — R58 Hemorrhage, not elsewhere classified: Secondary | ICD-10-CM | POA: Diagnosis not present

## 2018-05-04 DIAGNOSIS — F331 Major depressive disorder, recurrent, moderate: Secondary | ICD-10-CM

## 2018-05-04 MED ORDER — VENLAFAXINE HCL ER 37.5 MG PO CP24: 37.5000 mg | ORAL_CAPSULE | Freq: Every day | ORAL | 2 refills | Status: DC

## 2018-05-04 MED ORDER — DILTIAZEM HCL ER COATED BEADS 120 MG PO CP24: 120.0000 mg | ORAL_CAPSULE | Freq: Every day | ORAL | 11 refills | Status: DC

## 2018-05-04 NOTE — Patient Instructions (Addendum)
   STRONGLY suggest going to therapy and seeing a psychiatrist   I suggest calling your insurance and finding out who is in your network and THEN calling those people or looking them up on google.   I'm a big fan of Cognitive Behavioral Therapy, look this up on You tube or check with the therapist you see if they are certified.  This form of therapy helps to teach you skills to better handle with current situation that are causing anxiety or depression.   Instructed patient to contact office or on-call physician promptly should condition worsen or any new symptoms appear. IF THE PATIENT HAS ANY SUICIDAL OR HOMICIDAL IDEATIONS, CALL THE OFFICE, DISCUSS WITH A SUPPORT MEMBER, OR GO TO THE ER IMMEDIATELY. Patient was agreeable with this plan.   Start on effexor 37.5mg  daily Continue lexapro and valium
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
## 2018-05-05 LAB — COMPLETE METABOLIC PANEL WITH GFR
AG RATIO: 2.1 (calc) (ref 1.0–2.5)
ALT: 21 U/L (ref 6–29)
AST: 17 U/L (ref 10–35)
Albumin: 4.6 g/dL (ref 3.6–5.1)
Alkaline phosphatase (APISO): 116 U/L (ref 37–153)
BUN: 13 mg/dL (ref 7–25)
CALCIUM: 9.7 mg/dL (ref 8.6–10.4)
CO2: 25 mmol/L (ref 20–32)
Chloride: 105 mmol/L (ref 98–110)
Creat: 0.91 mg/dL (ref 0.50–1.05)
GFR, Est African American: 81 mL/min/{1.73_m2} (ref >=60)
GFR, Est Non African American: 70 mL/min/{1.73_m2} (ref >=60)
Globulin: 2.2 g/dL (calc) (ref 1.9–3.7)
Glucose, Bld: 96 mg/dL (ref 65–99)
Potassium: 4 mmol/L (ref 3.5–5.3)
Sodium: 141 mmol/L (ref 135–146)
Total Bilirubin: 0.5 mg/dL (ref 0.2–1.2)
Total Protein: 6.8 g/dL (ref 6.1–8.1)

## 2018-05-05 LAB — CBC WITH DIFFERENTIAL/PLATELET
Absolute Monocytes: 618 cells/uL (ref 200–950)
Basophils Absolute: 52 cells/uL (ref 0–200)
Basophils Relative: 0.5 %
Eosinophils Absolute: 124 cells/uL (ref 15–500)
Eosinophils Relative: 1.2 %
HCT: 42.7 % (ref 35.0–45.0)
Hemoglobin: 14.5 g/dL (ref 11.7–15.5)
Lymphs Abs: 1957 cells/uL (ref 850–3900)
MCH: 31 pg (ref 27.0–33.0)
MCHC: 34 g/dL (ref 32.0–36.0)
MCV: 91.2 fL (ref 80.0–100.0)
MPV: 10.5 fL (ref 7.5–12.5)
Monocytes Relative: 6 %
Neutro Abs: 7550 cells/uL (ref 1500–7800)
Neutrophils Relative %: 73.3 %
Platelets: 344 10*3/uL (ref 140–400)
RBC: 4.68 10*6/uL (ref 3.80–5.10)
RDW: 12.7 % (ref 11.0–15.0)
Total Lymphocyte: 19 %
WBC: 10.3 10*3/uL (ref 3.8–10.8)

## 2018-05-05 LAB — PROTIME-INR
INR: 0.9
Prothrombin Time: 9.8 s (ref 9.0–11.5)

## 2018-05-05 LAB — APTT: aPTT: 29 s (ref 22–34)

## 2018-06-03 ENCOUNTER — Ambulatory Visit: Payer: Self-pay | Admitting: Physician Assistant

## 2018-06-05 ENCOUNTER — Telehealth: Payer: 59 | Admitting: Physician Assistant

## 2018-06-05 DIAGNOSIS — I10 Essential (primary) hypertension: Secondary | ICD-10-CM | POA: Diagnosis not present

## 2018-06-05 DIAGNOSIS — J449 Chronic obstructive pulmonary disease, unspecified: Secondary | ICD-10-CM

## 2018-06-05 DIAGNOSIS — I2510 Atherosclerotic heart disease of native coronary artery without angina pectoris: Secondary | ICD-10-CM

## 2018-06-05 DIAGNOSIS — R05 Cough: Secondary | ICD-10-CM

## 2018-06-05 DIAGNOSIS — M25571 Pain in right ankle and joints of right foot: Secondary | ICD-10-CM | POA: Diagnosis not present

## 2018-06-05 DIAGNOSIS — F17200 Nicotine dependence, unspecified, uncomplicated: Secondary | ICD-10-CM

## 2018-06-05 DIAGNOSIS — E785 Hyperlipidemia, unspecified: Secondary | ICD-10-CM

## 2018-06-05 DIAGNOSIS — R059 Cough, unspecified: Secondary | ICD-10-CM

## 2018-06-05 DIAGNOSIS — F3341 Major depressive disorder, recurrent, in partial remission: Secondary | ICD-10-CM | POA: Diagnosis not present

## 2018-06-05 MED ORDER — VENLAFAXINE HCL ER 75 MG PO CP24: 75.0000 mg | ORAL_CAPSULE | Freq: Every day | ORAL | 1 refills | Status: DC

## 2018-06-05 MED ORDER — DIAZEPAM 2 MG PO TABS: 2.0000 mg | ORAL_TABLET | Freq: Two times a day (BID) | ORAL | 0 refills | Status: DC | PRN

## 2018-06-05 MED ORDER — BENZONATATE 200 MG PO CAPS: 200.0000 mg | ORAL_CAPSULE | Freq: Three times a day (TID) | ORAL | 0 refills | Status: DC | PRN

## 2018-06-05 MED ORDER — MELOXICAM 15 MG PO TABS: 15.0000 mg | ORAL_TABLET | Freq: Every day | ORAL | 0 refills | Status: DC | PRN

## 2018-06-05 NOTE — Telephone Encounter (Signed)
THIS ENCOUNTER IS A VIRTUAL VISIT DUE TO COVID-19 - PATIENT WAS NOT SEEN IN THE OFFICE.  PATIENT HAS CONSENTED TO VIRTUAL VISIT / TELEMEDICINE VISIT   Virtual Visit via telephone Note  I connected with Nicole Phelps on 06/05/18  by telephone.  I verified that I am speaking with the correct person using two identifiers.    I discussed the limitations of evaluation and management by telemedicine and the availability of in person appointments. The patient expressed understanding and agreed to proceed.  History of Present Illness: 58 y.o. smoking WF with history of COPD and [place] calls for follow up.  Last visit she was started on effexor with her lexapro due to stress, she states she is doing well with this, feels she has more energy. She states that she has contacted her insurance for people to talk to but now with coronavirus she has not been able to see them.   She has stopped her Singulair and I have told her to do that. She is on flonase and claritin. She is on advair twice a day. She has cough productive x 2 days, no fever, chills. She has been in the house x 2 weeks, her husband has been out working. He does not have any symptoms.  Would like tessalon.   She continues to smoke but we discussed smoking cessation especially during this crisis, she only smoked 3 cigs yesterday.   She does not have a way to check her BP, she has not had a temperature.   Her blood pressure has been controlled at home  She does not workout. She denies chest pain,  Dizziness. Has a cough and some mild SOB at this time.   She is on cholesterol medication and denies myalgias. Her cholesterol is not at goal, needs recheck at next [place]. The cholesterol last visit was:   Lab Results  Component Value Date   CHOL 239 (H) 12/30/2017   HDL 52 12/30/2017   LDLCALC 155 (H) 12/30/2017   TRIG 181 (H) 12/30/2017   CHOLHDL 4.6 12/30/2017   . Last A1C in the office was:  Lab Results  Component Value Date   HGBA1C  5.4 12/30/2017   Patient is on Vitamin D supplement.   Lab Results  Component Value Date   VD25OH 43 12/30/2017       Medications   Current Outpatient Medications (Cardiovascular):  .  diltiazem (CARDIZEM CD) 120 MG 24 hr capsule, Take 1 capsule (120 mg total) by mouth daily. .  metoprolol tartrate (LOPRESSOR) 50 MG tablet, Take 1 tablet (50 mg total) by mouth once for 1 dose. .  nitroGLYCERIN (NITROSTAT) 0.4 MG SL tablet, Place 1 tablet (0.4 mg total) under the tongue every 5 (five) minutes as needed for chest pain. (Patient not taking: Reported on 05/04/2018) .  rosuvastatin (CRESTOR) 10 MG tablet, TAKE 1 TABLET BY MOUTH EVERY DAY  Current Outpatient Medications (Respiratory):  .  albuterol (VENTOLIN HFA) 108 (90 Base) MCG/ACT inhaler, USE 2 PUFFS EVERY 6 HOURS  AS NEEDED FOR SHORTNESS OF  BREATH .  azelastine (ASTELIN) 0.1 % nasal spray, Place 2 sprays into both nostrils 2 (two) times daily. Use in each nostril as directed .  fluticasone (FLONASE) 50 MCG/ACT nasal spray, Place 1 spray into both nostrils daily. .  Fluticasone-Salmeterol (ADVAIR DISKUS) 100-50 MCG/DOSE AEPB, USE 1 INHALATION TWICE  DAILY .  montelukast (SINGULAIR) 10 MG tablet, TAKE 1 TABLET BY MOUTH  DAILY  Current Outpatient Medications (Analgesics):  .  diclofenac (VOLTAREN) 75 MG EC tablet, Take 75 mg by mouth 2 (two) times daily. .  meloxicam (MOBIC) 15 MG tablet, TAKE 1 TABLET BY MOUTH  DAILY WITH FOOD AS NEEDED  FOR PAIN   Current Outpatient Medications (Other):  .  cholecalciferol (VITAMIN D) 1000 UNITS tablet, Take 2,000 Units by mouth daily.  .  diazepam (VALIUM) 2 MG tablet, Take 1 tablet (2 mg total) by mouth every 12 (twelve) hours as needed for anxiety (dizziness). Marland Kitchen  escitalopram (LEXAPRO) 20 MG tablet, TAKE 1 TABLET BY MOUTH  DAILY .  Multiple Vitamin (MULTIVITAMIN) capsule, Take 1 capsule by mouth daily. Marland Kitchen  venlafaxine XR (EFFEXOR XR) 37.5 MG 24 hr capsule, Take 1 capsule (37.5 mg total) by mouth  daily.  Problem list She has Hypertension; Hyperlipidemia; Depression; Vitamin D deficiency; Obesity; Smoker; Estrogen deficiency; Asthma; Benign hematuria; B12 deficiency; Chronic obstructive pulmonary disease ([place]); Abnormal electrocardiogram (ECG) (EKG); Angina pectoris ([place]); SOB (shortness of breath) on exertion; Bruises easily; and Mild CAD on their problem list.   Observations/Objective: General Appearance:, in no apparent distress.  ENT/Mouth: No hoarseness, she was coughing during our phone coversation Respiratory: completing full sentences without distress on the phone   Neuro: Awake and oriented X 3,  Psych:  Insight and Judgment appropriate.    Assessment and Plan: Diagnoses and all orders for this visit:  Chronic obstructive pulmonary disease, unspecified COPD type ([place]) Continue advair, albuterol, will send in tessalon No fever, no possible contact, will self quarantine and stay in contact with me. Will send information to the patient via her daughter Nicole Phelps.   Recurrent major depressive disorder, in partial remission ([place]) Will increase her effexor to 75mg , will give her a name of counselor doing video visits.  No SI/HI -     diazepam (VALIUM) 2 MG tablet; Take 1 tablet (2 mg total) by mouth every 12 (twelve) hours as needed for anxiety (dizziness). -     venlafaxine XR (EFFEXOR-XR) 75 MG 24 hr capsule; Take 1 capsule (75 mg total) by mouth daily.  Essential hypertension - continue medications, DASH diet, exercise and monitor at home. Call if greater than 130/80.   Mild CAD Control blood pressure, cholesterol, glucose, increase exercise.   Smoker -     benzonatate (TESSALON) 200 MG capsule; Take 1 capsule (200 mg total) by mouth 3 (three) times daily as needed for cough (Max: 600mg  per day). Smoking cessation-  instruction/counseling given, counseled patient on the dangers of tobacco use, advised patient to stop smoking, and reviewed strategies to maximize  success, patient will continue to cut back.   Hyperlipidemia, unspecified hyperlipidemia type Continue meds, patient has enough at this time decrease fatty foods increase activity.   Cough -     benzonatate (TESSALON) 200 MG capsule; Take 1 capsule (200 mg total) by mouth 3 (three) times daily as needed for cough (Max: 600mg  per day). URI versus COPD vs allergies vs COVID Currently mild symptoms, and patient without known contact or fever. Suggested symptomatic OTC remedies. Nasal steroids, allergy pill, staying hydrated Follow up as needed via mychart or text.. Will do self isolation at home Patient understands will not break quarantine without my permission.    Follow Up Instructions:    I discussed the assessment and treatment plan with the patient. The patient was provided an opportunity to ask questions and all were answered. The patient agreed with the plan and demonstrated an understanding of the instructions.   The patient was advised to call  back or seek an in-person evaluation if the symptoms worsen or if the condition fails to improve as anticipated.  I provided 30-40 minutes of non-face-to-face time during this encounter.   Vicie Mutters, PA-C

## 2018-06-29 ENCOUNTER — Other Ambulatory Visit: Payer: Self-pay | Admitting: Internal Medicine

## 2018-07-01 ENCOUNTER — Other Ambulatory Visit: Payer: Self-pay

## 2018-07-01 ENCOUNTER — Ambulatory Visit: Payer: 59 | Admitting: Physician Assistant

## 2018-07-01 ENCOUNTER — Encounter: Payer: Self-pay | Admitting: Physician Assistant

## 2018-07-01 ENCOUNTER — Ambulatory Visit: Payer: Self-pay | Admitting: Physician Assistant

## 2018-07-01 VITALS — Ht 65.0 in | Wt 185.0 lb

## 2018-07-01 DIAGNOSIS — I10 Essential (primary) hypertension: Secondary | ICD-10-CM | POA: Diagnosis not present

## 2018-07-01 DIAGNOSIS — I2510 Atherosclerotic heart disease of native coronary artery without angina pectoris: Secondary | ICD-10-CM

## 2018-07-01 DIAGNOSIS — F3341 Major depressive disorder, recurrent, in partial remission: Secondary | ICD-10-CM

## 2018-07-01 DIAGNOSIS — J449 Chronic obstructive pulmonary disease, unspecified: Secondary | ICD-10-CM

## 2018-07-01 DIAGNOSIS — F17200 Nicotine dependence, unspecified, uncomplicated: Secondary | ICD-10-CM | POA: Diagnosis not present

## 2018-07-01 NOTE — Progress Notes (Signed)
THIS ENCOUNTER IS A VIRTUAL VISIT DUE TO COVID-19 - PATIENT WAS NOT SEEN IN THE OFFICE.  PATIENT HAS CONSENTED TO VIRTUAL VISIT / TELEMEDICINE VISIT   Virtual Visit via telephone Note  I connected with Nicole Phelps on 07/01/18  by telephone.  I verified that I am speaking with the correct person using two identifiers.    I discussed the limitations of evaluation and management by telemedicine and the availability of in person appointments. The patient expressed understanding and agreed to proceed.  History of Present Illness: 58 y.o. smoking WF with history of COPD and [facility] calls for follow up.   Last visit she was started on effexor with her lexapro due to stress, she states she is doing well with this, feels she has more energy. She states that she has contacted her insurance for people to talk to but now with coronavirus she has not been able to see them.   She has stopped her Singulair.  She is on flonase and xzyal. She is on advair twice a day. Cough has improved. He does not have any symptoms.  Would like tessalon.   She has been having hiccups for 3-4 months.Is on stomach med, has cut out bubbly drinks. She is on valium.   She continues to smoke but we discussed smoking cessation especially during this crisis, she only smoked 5 cigs yesterday.   She does not have a way to check her BP, she has not had a temperature.   Her blood pressure has been controlled at home  She does not workout. She denies chest pain,  Dizziness, SOB.   She is on cholesterol medication and denies myalgias. Her cholesterol is not at goal, needs recheck at next [place]. The cholesterol last visit was:   Lab Results  Component Value Date   CHOL 239 (H) 12/30/2017   HDL 52 12/30/2017   LDLCALC 155 (H) 12/30/2017   TRIG 181 (H) 12/30/2017   CHOLHDL 4.6 12/30/2017   . Last A1C in the office was:  Lab Results  Component Value Date   HGBA1C 5.4 12/30/2017   Patient is on Vitamin D supplement.   Lab  Results  Component Value Date   VD25OH 43 12/30/2017       Medications   Current Outpatient Medications (Cardiovascular):  .  diltiazem (CARDIZEM CD) 120 MG 24 hr capsule, Take 1 capsule (120 mg total) by mouth daily. .  rosuvastatin (CRESTOR) 10 MG tablet, TAKE 1 TABLET BY MOUTH EVERY DAY .  metoprolol tartrate (LOPRESSOR) 50 MG tablet, Take 1 tablet (50 mg total) by mouth once for 1 dose. .  nitroGLYCERIN (NITROSTAT) 0.4 MG SL tablet, Place 1 tablet (0.4 mg total) under the tongue every 5 (five) minutes as needed for chest pain. (Patient not taking: Reported on 05/04/2018)  Current Outpatient Medications (Respiratory):  .  albuterol (VENTOLIN HFA) 108 (90 Base) MCG/ACT inhaler, USE 2 PUFFS EVERY 6 HOURS  AS NEEDED FOR SHORTNESS OF  BREATH .  azelastine (ASTELIN) 0.1 % nasal spray, Place 2 sprays into both nostrils 2 (two) times daily. Use in each nostril as directed .  fluticasone (FLONASE) 50 MCG/ACT nasal spray, Place 1 spray into both nostrils daily. .  Fluticasone-Salmeterol (ADVAIR DISKUS) 100-50 MCG/DOSE AEPB, USE 1 INHALATION TWICE  DAILY .  montelukast (SINGULAIR) 10 MG tablet, TAKE 1 TABLET BY MOUTH  DAILY  Current Outpatient Medications (Analgesics):  .  diclofenac (VOLTAREN) 75 MG EC tablet, Take 75 mg by mouth 2 (two) times  daily. .  meloxicam (MOBIC) 15 MG tablet, Take 1 tablet (15 mg total) by mouth daily as needed for pain.   Current Outpatient Medications (Other):  .  cholecalciferol (VITAMIN D) 1000 UNITS tablet, Take 2,000 Units by mouth daily.  .  diazepam (VALIUM) 2 MG tablet, Take 1 tablet (2 mg total) by mouth every 12 (twelve) hours as needed for anxiety (dizziness). Marland Kitchen  escitalopram (LEXAPRO) 20 MG tablet, TAKE 1 TABLET BY MOUTH  DAILY .  Multiple Vitamin (MULTIVITAMIN) capsule, Take 1 capsule by mouth daily. .  pantoprazole (PROTONIX) 40 MG tablet, Take 1 tablet Daily for Indigestion & Reflux .  venlafaxine XR (EFFEXOR-XR) 75 MG 24 hr capsule, Take 1 capsule  (75 mg total) by mouth daily.  Problem list She has Hypertension; Hyperlipidemia; Depression; Vitamin D deficiency; Obesity; Smoker; Estrogen deficiency; Asthma; Benign hematuria; B12 deficiency; Chronic obstructive pulmonary disease ([place]); Abnormal electrocardiogram (ECG) (EKG); Angina pectoris ([place]); SOB (shortness of breath) on exertion; Bruises easily; and Mild CAD on their problem list.   Observations/Objective: General Appearance:, in no apparent distress.  ENT/Mouth: No hoarseness, she was coughing during our phone coversation Respiratory: completing full sentences without distress on the phone   Neuro: Awake and oriented X 3,  Psych:  Insight and Judgment appropriate.    Assessment and Plan:  Chronic obstructive pulmonary disease, unspecified COPD type ([place]) Continue advair, albuterol Continue xyzal.   Recurrent major depressive disorder, in partial remission ([place]) Will increase her effexor to 75mg  and lexapro PATIENT ASKED IF SHE WOULD QUALIFITY FOR DISABILITY, TOLD HER THAT WE DO NOT DO THAT, SHE NEEDS TO PYSCH She is still on the valium twice a day- disucssed need to cut back on that Will cut back to 1/2 valium in the morning and 1 at night Given number for mood treatment center No SI/HI -     diazepam (VALIUM) 2 MG tablet; Take 1 tablet (2 mg total) by mouth every 12 (twelve) hours as needed for anxiety (dizziness). -     venlafaxine XR (EFFEXOR-XR) 75 MG 24 hr capsule; Take 1 capsule (75 mg total) by mouth daily. WILL CALL IN 1 MONTH  Mild CAD Control blood pressure, cholesterol, glucose, increase exercise.   Smoker Smoking cessation-  instruction/counseling given, counseled patient on the dangers of tobacco use, advised patient to stop smoking, and reviewed strategies to maximize success, patient will continue to cut back.   Follow Up Instructions:    I discussed the assessment and treatment plan with the patient. The patient was provided an opportunity to ask  questions and all were answered. The patient agreed with the plan and demonstrated an understanding of the instructions.   The patient was advised to call back or seek an in-person evaluation if the symptoms worsen or if the condition fails to improve as anticipated.  I provided 20 minutes of non-face-to-face time during this encounter.  Future Appointments  Date Time Provider [place]  01/07/2019  9:00 AM Vicie Mutters, PA-C [id number] None    Vicie Mutters, [place]

## 2018-07-21 ENCOUNTER — Telehealth: Payer: Self-pay | Admitting: Physician Assistant

## 2018-07-21 MED ORDER — ESCITALOPRAM OXALATE 20 MG PO TABS: 20.0000 mg | ORAL_TABLET | Freq: Every day | ORAL | 1 refills | Status: DC

## 2018-07-21 MED ORDER — DILTIAZEM HCL ER COATED BEADS 120 MG PO CP24: 120.0000 mg | ORAL_CAPSULE | Freq: Every day | ORAL | 1 refills | Status: DC

## 2018-07-21 NOTE — Telephone Encounter (Signed)
-----   Message from Elenor Quinones, [place] sent at 07/20/2018  4:10 PM EDT ----- Regarding: refill & med question Contact: [phone] PER OFFICE NOTE  Med refill on ESCITALOPRAM------SEND TO [facility]  Patient was taking 250 mgs of blood pressure meds. Which was recently reduced to 125 mgs.  [facility] has sent her a 90 day supply of the 250 mgs She wants to know if she can take one every other day or what should she do since she just paid for them.  Nicole Phelps was called in order to ascertain what the name of the medication is

## 2018-07-21 NOTE — Telephone Encounter (Signed)
Patient has been informed of DILTIAZEM CD CAP 240mg  that was sent to her pharmacy.  Patient will try taking it every other day & keep check on BP as requested.  & call with any questions or concerns

## 2018-07-21 NOTE — Telephone Encounter (Signed)
Sometimes if it is unopened, still has foil on it, they will take it back however if not she can  try to do it every other day but it really is a 24 hour medication. Just monitor BP VERY closely, will send in [facility] day to [facility]

## 2018-10-18 ENCOUNTER — Other Ambulatory Visit: Payer: Self-pay | Admitting: Physician Assistant

## 2018-10-18 DIAGNOSIS — J4520 Mild intermittent asthma, uncomplicated: Secondary | ICD-10-CM

## 2018-11-03 ENCOUNTER — Other Ambulatory Visit: Payer: Self-pay | Admitting: *Deleted

## 2018-11-03 DIAGNOSIS — F3341 Major depressive disorder, recurrent, in partial remission: Secondary | ICD-10-CM

## 2018-11-03 MED ORDER — VENLAFAXINE HCL ER 75 MG PO CP24: 75.0000 mg | ORAL_CAPSULE | Freq: Every day | ORAL | 0 refills | Status: DC

## 2018-12-02 ENCOUNTER — Encounter: Payer: Self-pay | Admitting: Physician Assistant

## 2018-12-02 ENCOUNTER — Ambulatory Visit: Payer: 59 | Admitting: Physician Assistant

## 2018-12-02 ENCOUNTER — Other Ambulatory Visit: Payer: Self-pay | Admitting: Physician Assistant

## 2018-12-02 ENCOUNTER — Other Ambulatory Visit: Payer: Self-pay

## 2018-12-02 ENCOUNTER — Encounter: Payer: Self-pay | Admitting: Gastroenterology

## 2018-12-02 VITALS — BP 140/82 | HR 74 | Temp 97.3°F | Ht 65.0 in | Wt 187.0 lb

## 2018-12-02 DIAGNOSIS — F419 Anxiety disorder, unspecified: Secondary | ICD-10-CM

## 2018-12-02 DIAGNOSIS — E559 Vitamin D deficiency, unspecified: Secondary | ICD-10-CM

## 2018-12-02 DIAGNOSIS — F3341 Major depressive disorder, recurrent, in partial remission: Secondary | ICD-10-CM

## 2018-12-02 DIAGNOSIS — R1032 Left lower quadrant pain: Secondary | ICD-10-CM | POA: Diagnosis not present

## 2018-12-02 DIAGNOSIS — R112 Nausea with vomiting, unspecified: Secondary | ICD-10-CM

## 2018-12-02 DIAGNOSIS — J45909 Unspecified asthma, uncomplicated: Secondary | ICD-10-CM

## 2018-12-02 DIAGNOSIS — Z130 Encounter for screening for diseases of the blood and blood-forming organs and certain disorders involving the immune mechanism: Secondary | ICD-10-CM

## 2018-12-02 DIAGNOSIS — M25571 Pain in right ankle and joints of right foot: Secondary | ICD-10-CM

## 2018-12-02 DIAGNOSIS — R1011 Right upper quadrant pain: Secondary | ICD-10-CM | POA: Diagnosis not present

## 2018-12-02 DIAGNOSIS — G8929 Other chronic pain: Secondary | ICD-10-CM

## 2018-12-02 DIAGNOSIS — R0602 Shortness of breath: Secondary | ICD-10-CM

## 2018-12-02 DIAGNOSIS — Z79899 Other long term (current) drug therapy: Secondary | ICD-10-CM

## 2018-12-02 DIAGNOSIS — M791 Myalgia, unspecified site: Secondary | ICD-10-CM

## 2018-12-02 MED ORDER — ALBUTEROL SULFATE HFA 108 (90 BASE) MCG/ACT IN AERS: INHALATION_SPRAY | RESPIRATORY_TRACT | 1 refills | Status: DC

## 2018-12-02 MED ORDER — GABAPENTIN 300 MG PO CAPS: 300.0000 mg | ORAL_CAPSULE | Freq: Three times a day (TID) | ORAL | 2 refills | Status: DC

## 2018-12-02 MED ORDER — OMEPRAZOLE 40 MG PO CPDR: 40.0000 mg | DELAYED_RELEASE_CAPSULE | Freq: Every day | ORAL | 1 refills | Status: DC

## 2018-12-02 NOTE — Patient Instructions (Addendum)
I STRONGLY SUGGEST YOU FIND A PSYCHIATRIST.   Stop the meloxicam  Start the omeprazole 40 mg Stop the melxicam and stop the protonix Will get HIDA scan Will refer to GI  Can take the gabapentin 300mg  at night.   It can make you sleepy so we suggest trying it at night first and please plan to not drive or do anything strenuous. TAKE 1-3 HOURS BEFORE BED, EACH PERSON METABOLIZES IT DIFFERENTLY SO YOU CAN PLAY WITH THE TIMING  Also please do not take this medication with alcohol.   Start out 1 pill at night before bed, can increase to 2 pills at night before bed. Please call the office if you have any side effects.    Common side effects are sleepiness, concentration problems, dizziness, swelling.  Do not stop abruptly unless you have a reaction to it.    Suggested measures to reduce indoor fungal exposure Reduce indoor relative humidity ?50% Repair water leaks Treat washable surfaces with detergent and water and then allow to dry completely Remove contaminated carpets, wallpaper, and woodwork  Can contact a Programmer, systems to evaluate for mold Contact friend
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
## 2018-12-03 ENCOUNTER — Telehealth: Payer: Self-pay | Admitting: Physician Assistant

## 2018-12-03 MED ORDER — AMOXICILLIN-POT CLAVULANATE 875-125 MG PO TABS: 1.0000 | ORAL_TABLET | Freq: Two times a day (BID) | ORAL | 0 refills | Status: AC

## 2018-12-03 NOTE — Progress Notes (Signed)
12/03/2018--Patient is aware of lab results and instructions. Nicole Phelps

## 2018-12-03 NOTE — Addendum Note (Signed)
Addended by: Vicie Mutters R on: 12/03/2018 08:42 AM   Modules accepted: Orders

## 2018-12-03 NOTE — Telephone Encounter (Signed)
-----   Message from Elenor Quinones, [place] sent at 12/03/2018  3:46 PM EDT ----- Regarding: OFFICE NOTE/ VIT SUGGESTION Contact: [phone] CARDIOLOGIST has suggested that she take a multi-vitamin. Patient states you told her to not take one. What should she do?  Also patient was very tearful and wanted to tell you that she was sorry for being UGLY with you on yesterday!

## 2018-12-05 LAB — COMPLETE METABOLIC PANEL WITH GFR
AG Ratio: 2 (calc) (ref 1.0–2.5)
ALT: 19 U/L (ref 6–29)
AST: 18 U/L (ref 10–35)
Albumin: 4.5 g/dL (ref 3.6–5.1)
Alkaline phosphatase (APISO): 93 U/L (ref 37–153)
BUN: 21 mg/dL (ref 7–25)
CO2: 23 mmol/L (ref 20–32)
Calcium: 9.9 mg/dL (ref 8.6–10.4)
Chloride: 107 mmol/L (ref 98–110)
Creat: 0.89 mg/dL (ref 0.50–1.05)
GFR, Est African American: 83 mL/min/{1.73_m2} (ref >=60)
GFR, Est Non African American: 71 mL/min/{1.73_m2} (ref >=60)
Globulin: 2.2 g/dL (calc) (ref 1.9–3.7)
Glucose, Bld: 99 mg/dL (ref 65–99)
Potassium: 3.8 mmol/L (ref 3.5–5.3)
Sodium: 141 mmol/L (ref 135–146)
Total Bilirubin: 0.4 mg/dL (ref 0.2–1.2)
Total Protein: 6.7 g/dL (ref 6.1–8.1)

## 2018-12-05 LAB — CBC WITH DIFFERENTIAL/PLATELET
Absolute Monocytes: 932 cells/uL (ref 200–950)
Basophils Absolute: 16 cells/uL (ref 0–200)
Basophils Relative: 0.1 %
Eosinophils Absolute: 0 cells/uL — ABNORMAL LOW (ref 15–500)
Eosinophils Relative: 0 %
HCT: 41.1 % (ref 35.0–45.0)
Hemoglobin: 13.9 g/dL (ref 11.7–15.5)
Lymphs Abs: 2591 cells/uL (ref 850–3900)
MCH: 31 pg (ref 27.0–33.0)
MCHC: 33.8 g/dL (ref 32.0–36.0)
MCV: 91.7 fL (ref 80.0–100.0)
MPV: 11.1 fL (ref 7.5–12.5)
Monocytes Relative: 5.9 %
Neutro Abs: 12261 cells/uL — ABNORMAL HIGH (ref 1500–7800)
Neutrophils Relative %: 77.6 %
Platelets: 370 10*3/uL (ref 140–400)
RBC: 4.48 10*6/uL (ref 3.80–5.10)
RDW: 13 % (ref 11.0–15.0)
Total Lymphocyte: 16.4 %
WBC: 15.8 10*3/uL — ABNORMAL HIGH (ref 3.8–10.8)

## 2018-12-05 LAB — ANCA SCREEN W REFLEX TITER: ANCA Screen: NEGATIVE

## 2018-12-05 LAB — ANTI-DNA ANTIBODY, DOUBLE-STRANDED: ds DNA Ab: <1 IU/mL

## 2018-12-05 LAB — ROCKY MTN SPOTTED FVR ABS PNL(IGG+IGM)
RMSF IgG: NOT DETECTED
RMSF IgM: NOT DETECTED

## 2018-12-05 LAB — VITAMIN B12: Vitamin B-12: 838 pg/mL (ref 200–1100)

## 2018-12-05 LAB — MAGNESIUM: Magnesium: 2.1 mg/dL (ref 1.5–2.5)

## 2018-12-05 LAB — IRON, TOTAL/TOTAL IRON BINDING CAP
%SAT: 31 % (calc) (ref 16–45)
Iron: 95 ug/dL (ref 45–160)
TIBC: 306 mcg/dL (calc) (ref 250–450)

## 2018-12-05 LAB — VITAMIN D 25 HYDROXY (VIT D DEFICIENCY, FRACTURES): Vit D, 25-Hydroxy: 27 ng/mL — ABNORMAL LOW (ref 30–100)

## 2018-12-05 LAB — ANA: Anti Nuclear Antibody (ANA): NEGATIVE

## 2018-12-05 LAB — B. BURGDORFI ANTIBODIES: B burgdorferi Ab IgG+IgM: <0.9 index

## 2018-12-05 LAB — SEDIMENTATION RATE: Sed Rate: 6 mm/h (ref 0–30)

## 2018-12-05 LAB — TSH: TSH: 1.31 mIU/L (ref 0.40–4.50)

## 2018-12-05 LAB — RHEUMATOID FACTOR: Rheumatoid fact SerPl-aCnc: <14 IU/mL (ref <14)

## 2018-12-07 NOTE — Telephone Encounter (Signed)
Patient has been informed of this message. Voiced understanding & agreed to what the provider has voiced.

## 2018-12-08 ENCOUNTER — Telehealth: Payer: Self-pay | Admitting: Physician Assistant

## 2018-12-08 MED ORDER — FLUCONAZOLE 150 MG PO TABS: 150.0000 mg | ORAL_TABLET | Freq: Every day | ORAL | 3 refills | Status: DC

## 2018-12-08 NOTE — Telephone Encounter (Signed)
-----   Message from Elenor Quinones, [place] sent at 12/08/2018  3:48 PM EDT ----- Regarding: med request Sorry if I sent this message but Patient reports a yeast infection  Needs something called into pharmacy  Please & thank you

## 2018-12-09 ENCOUNTER — Encounter (HOSPITAL_COMMUNITY): Payer: 59

## 2018-12-16 ENCOUNTER — Encounter (HOSPITAL_COMMUNITY)
Admission: RE | Admit: 2018-12-16 | Discharge: 2018-12-16 | Disposition: A | Discharge status: Home / Self Care | Payer: 59 | Source: Ambulatory Visit | Attending: Physician Assistant | Admitting: Physician Assistant

## 2018-12-16 ENCOUNTER — Other Ambulatory Visit: Payer: Self-pay

## 2018-12-16 DIAGNOSIS — R1011 Right upper quadrant pain: Secondary | ICD-10-CM | POA: Diagnosis present

## 2018-12-16 DIAGNOSIS — R112 Nausea with vomiting, unspecified: Secondary | ICD-10-CM | POA: Insufficient documentation

## 2018-12-16 MED ORDER — TECHNETIUM TC 99M MEBROFENIN IV KIT
5.0000 | PACK | Freq: Once | INTRAVENOUS | Status: AC | PRN
  Administered 2018-12-16: 5 via INTRAVENOUS

## 2018-12-27 ENCOUNTER — Other Ambulatory Visit: Payer: Self-pay | Admitting: Physician Assistant

## 2018-12-27 DIAGNOSIS — F3341 Major depressive disorder, recurrent, in partial remission: Secondary | ICD-10-CM

## 2018-12-27 DIAGNOSIS — M25571 Pain in right ankle and joints of right foot: Secondary | ICD-10-CM

## 2018-12-31 ENCOUNTER — Other Ambulatory Visit: Payer: Self-pay

## 2018-12-31 DIAGNOSIS — R1011 Right upper quadrant pain: Secondary | ICD-10-CM

## 2018-12-31 DIAGNOSIS — R0602 Shortness of breath: Secondary | ICD-10-CM

## 2018-12-31 DIAGNOSIS — F419 Anxiety disorder, unspecified: Secondary | ICD-10-CM

## 2018-12-31 MED ORDER — GABAPENTIN 300 MG PO CAPS: 300.0000 mg | ORAL_CAPSULE | Freq: Three times a day (TID) | ORAL | 0 refills | Status: DC

## 2018-12-31 MED ORDER — OMEPRAZOLE 40 MG PO CPDR: 40.0000 mg | DELAYED_RELEASE_CAPSULE | Freq: Every day | ORAL | 0 refills | Status: DC

## 2019-01-05 NOTE — Progress Notes (Signed)
Referring Provider: Vicie Mutters, PA-C Primary Care Physician:  Unk Pinto, MD  Reason for Consultation:  RUQ pain, "can't keep food down"   IMPRESSION:  RUQ pain with nausea and vomiting Meloxicam use daily Personal history of colon polyps    -  Colonoscopy with Dr. Allyn Kenner 2019 revealed a tubular adenoma, sigmoid diverticulosis    - Surveillance recommended in 2024 Family history of colon cancer (mother 54; Two maternal aunts with colon cancer (60 and 27))  Patient notes that the appointment was made when she was feeling poorly.  Her symptoms have largely improved on omeprazole.  She does not feel additional evaluation or treatment is indicated at this time.  Her symptoms may be related to reflux, a side effect of NSAIDs, gastritis, H. pylori, a manifestation of anxiety, and exacerbated by recent unintentional weight gain.  PLAN: Avoid all NSAIDs Omeprazole 40 mg taken 30 minutes prior to breakfast Famotidine 20 mg twice daily as needed for breakthrough symptoms Return PRN, patient declined formal follow-up Consider upper endoscopy with any persistent symptoms or with the development of any alarm features  Please see the "Patient Instructions" section for addition details about the plan.  HPI: Nicole Phelps is a 58 y.o. female referred by PA Silverio Lay for further evaluation of vomiting and diarrhea. The history is obtained through the patient and review of her electronic health record.  She has asthma and COPD. She has multiple GI concerns.   4 years of GI symptoms. Took Zantac for years for nausea and vomiting.  Has recently experienced constant nausea with intermittent vomiting. Wakes up feeling normal but develops nausea and vomiting soon thereafter Starting take Prilosec 30 minutes prior to breakfast 35 days ago. Symptoms have largely resolved. No longer having nausea.   Has not felt comfortable trying foods like seafood and shellfish as these have triggered her  symptoms in the past.  Avoids fatty foods and alcohol. Started gabapentin for a feeling that her insides were "jumpy." Previously on Valium but was having frequent hiccups.  She has RUQ pain, RLQ pain, and left lower quadrant pain with intercourse. Appetite is good.  Weight has increased particularly around her son's wedding.   Baseline two formed, bowel habits are two bowel movements a day. No blood or mucous.   She is on meloxicam for pain and protonix 40 mg for stomach, states neither is helping.   Prior abdominal imaging:  Normal gallbladder US 12/2017.  HIDA with CCK 12/16/18:  Patent biliary tree with normal gallbladder ejection fraction of 56% following fatty meal stimulation.  Colonoscopy with Dr. Allyn Kenner 2019 revealed a tubular adenoma, sigmoid diverticulosis. Surveillance colonoscopy recommended in 5 years.    Mother with colon cancer diagnosed at age 67. Two maternal aunts with colon cancer (63 and 63). No other known family history of colon cancer or polyps. No family history of uterine/endometrial cancer, pancreatic cancer or gastric/stomach cancer.   Past Medical History:  Diagnosis Date  . Allergy   . Arthritis   . Asthma   . Benign hematuria 2004  . Depression   . Hyperlipidemia   . Hypertension   . TMJ (temporomandibular joint syndrome)   . Vitamin D deficiency     Past Surgical History:  Procedure Laterality Date  . ABDOMINAL HYSTERECTOMY    . ANKLE SURGERY Left 2017  . BREAST SURGERY Left    lumpectomy benign  . KNEE ARTHROSCOPY W/ DEBRIDEMENT Right 11/2017  . TUBAL LIGATION      Current Outpatient Medications  Medication Sig Dispense Refill  . albuterol (VENTOLIN HFA) 108 (90 Base) MCG/ACT inhaler USE 2 PUFFS EVERY 6 HOURS  AS NEEDED FOR SHORTNESS OF  BREATH 48 g 1  . azelastine (ASTELIN) 0.1 % nasal spray Place 2 sprays into both nostrils 2 (two) times daily. Use in each nostril as directed 30 mL 2  . cholecalciferol (VITAMIN D) 1000 UNITS tablet  Take 2,000 Units by mouth daily.     Marland Kitchen diltiazem (CARDIZEM CD) 120 MG 24 hr capsule TAKE 1 CAPSULE BY MOUTH  DAILY 90 capsule 3  . escitalopram (LEXAPRO) 20 MG tablet TAKE 1 TABLET BY MOUTH  DAILY 90 tablet 3  . fluconazole (DIFLUCAN) 150 MG tablet Take 1 tablet (150 mg total) by mouth daily. 1 tablet 3  . fluticasone (FLONASE) 50 MCG/ACT nasal spray Place 1 spray into both nostrils daily. 16 g 2  . Fluticasone-Salmeterol (ADVAIR DISKUS) 100-50 MCG/DOSE AEPB Use 1 Inhalation 2 x /day 180 each 3  . gabapentin (NEURONTIN) 300 MG capsule Take 1 capsule (300 mg total) by mouth 3 (three) times daily. 90 capsule 0  . meloxicam (MOBIC) 15 MG tablet TAKE 1 TABLET BY MOUTH  DAILY AS NEEDED FOR PAIN 90 tablet 3  . metoprolol tartrate (LOPRESSOR) 50 MG tablet Take 1 tablet (50 mg total) by mouth once for 1 dose. 2 tablet 0  . Multiple Vitamin (MULTIVITAMIN) capsule Take 1 capsule by mouth daily.    . nitroGLYCERIN (NITROSTAT) 0.4 MG SL tablet Place 1 tablet (0.4 mg total) under the tongue every 5 (five) minutes as needed for chest pain. (Patient not taking: Reported on 05/04/2018) 25 tablet 11  . omeprazole (PRILOSEC) 40 MG capsule Take 1 capsule (40 mg total) by mouth daily. 90 capsule 0  . rosuvastatin (CRESTOR) 10 MG tablet TAKE 1 TABLET BY MOUTH EVERY DAY 90 tablet 3  . venlafaxine XR (EFFEXOR-XR) 75 MG 24 hr capsule TAKE 1 CAPSULE BY MOUTH  DAILY 90 capsule 3   No current facility-administered medications for this visit.     Allergies as of 01/06/2019 - Review Complete 07/01/2018  Allergen Reaction Noted  . Other Shortness Of Breath 01/05/2018  . Azithromycin Hives 06/13/2016  . Celexa [citalopram hydrobromide]  03/03/2013  . Trintellix [vortioxetine] Other (See Comments) 10/28/2015  . Vicodin [hydrocodone-acetaminophen] Nausea And Vomiting 10/07/2011  . Wellbutrin [bupropion]  03/03/2013    Family History  Problem Relation Age of Onset  . Cancer Mother        colon  . Arthritis Mother   .  Hypertension Father   . Stroke Father   . Breast cancer Sister     Social History   Socioeconomic History  . Marital status: Married    Spouse name: Not on file  . Number of children: Not on file  . Years of education: Not on file  . Highest education level: Not on file  Occupational History  . Not on file  Social Needs  . Financial resource strain: Not on file  . Food insecurity    Worry: Not on file    Inability: Not on file  . Transportation needs    Medical: Not on file    Non-medical: Not on file  Tobacco Use  . Smoking status: Current Every Day Smoker    Packs/day: 0.50    Years: 25.00    Pack years: 12.50  . Smokeless tobacco: Never Used  Substance and Sexual Activity  . Alcohol use: Not Currently  . Drug use: Yes  Types: Marijuana  . Sexual activity: Yes  Lifestyle  . Physical activity    Days per week: Not on file    Minutes per session: Not on file  . Stress: Not on file  Relationships  . Social Herbalist on phone: Not on file    Gets together: Not on file    Attends religious service: Not on file    Active member of club or organization: Not on file    Attends meetings of clubs or organizations: Not on file    Relationship status: Not on file  . Intimate partner violence    Fear of current or ex partner: Not on file    Emotionally abused: Not on file    Physically abused: Not on file    Forced sexual activity: Not on file  Other Topics Concern  . Not on file  Social History Narrative  . Not on file    Review of Systems: 12 system ROS is negative except as noted above in addition to pain all over including her neck, back, and hands..   Physical Exam: General:   Alert,  well-nourished, pleasant and cooperative in NAD Head:  Normocephalic and atraumatic. Eyes:  Sclera clear, no icterus.   Conjunctiva pink. Ears:  Normal auditory acuity. Nose:  No deformity, discharge,  or lesions. Mouth:  No deformity or lesions.   Neck:   Supple; no masses or thyromegaly. Lungs:  Clear throughout to auscultation.   No wheezes. Heart:  Regular rate and rhythm; no murmurs. Abdomen:  Soft,nontender, nondistended, normal bowel sounds, no rebound or guarding. No hepatosplenomegaly.   Rectal:  Deferred  Msk:  Symmetrical. No boney deformities LAD: No inguinal or umbilical LAD Extremities:  No clubbing or edema. Neurologic:  Alert and  oriented x4;  grossly nonfocal Skin:  Intact without significant lesions or rashes. Psych:  Alert and cooperative. Normal mood and affect.    Taggart Prasad L. Tarri Glenn, MD, MPH 01/05/2019, 8:43 PM

## 2019-01-06 ENCOUNTER — Ambulatory Visit: Payer: 59 | Admitting: Gastroenterology

## 2019-01-06 ENCOUNTER — Encounter: Payer: Self-pay | Admitting: Gastroenterology

## 2019-01-06 VITALS — BP 138/76 | HR 76 | Temp 97.3°F | Ht 65.0 in | Wt 194.6 lb

## 2019-01-06 DIAGNOSIS — R112 Nausea with vomiting, unspecified: Secondary | ICD-10-CM | POA: Diagnosis not present

## 2019-01-06 DIAGNOSIS — R1011 Right upper quadrant pain: Secondary | ICD-10-CM

## 2019-01-06 MED ORDER — FAMOTIDINE 20 MG PO TABS: 20.0000 mg | ORAL_TABLET | Freq: Two times a day (BID) | ORAL | 3 refills | Status: DC

## 2019-01-06 NOTE — Patient Instructions (Addendum)
Continue to take your omeprazole every day. This medication is best taken 30 minutes prior to breakfast.  You could use famotidine (Pepcid) 20 mg twice daily as needed for any breakthrough symptoms.   Congratulations on your sons wedding! Working to get back to your regular, pre-wedding weight may improve your recent symptoms.  Please call me if I can be of any help in the future.

## 2019-01-06 NOTE — Progress Notes (Signed)
Complete Physical  Assessment and Plan:  Chronic LLQ pain Recent normal colonoscopy, no diarrhea, constipation, s/p hysterectomy Chronic pain x 2-3 years, will get PAP-? From pelvic floor, May need CT if not improved- declines at this time  declines pelvic floor PT at this time  Essential hypertension - continue medications, DASH diet, exercise and monitor at home. Call if greater than 130/80.  -     Urinalysis, Routine w reflex microscopic -     Microalbumin / creatinine urine ratio -     EKG 12-Lead  Hyperlipidemia, unspecified hyperlipidemia type -     Lipid panel check lipids decrease fatty foods increase activity.   Recurrent major depressive disorder, in partial remission ([place]) - continue medications, stress management techniques discussed, increase water, good sleep hygiene discussed, increase exercise, and increase veggies.   Smoker -     DG Chest 2 View; Future Smoking cessation-  instruction/counseling given, counseled patient on the dangers of tobacco use, advised patient to stop smoking, and reviewed strategies to maximize success, patient not ready to quit at this time.   B12 deficiency - stay on med  Estrogen deficiency Monitor  Vitamin D deficiency -     Recently checked  Benign hematuria Check urine  Medication management -     Magnesium  Chronic obstructive pulmonary disease, unspecified COPD type ([place]) Advised to stop smoking - risks discussed. Declines medications.  CXR annually Currently stable with respiratory medications  Mild intermittent asthma without complication -     Fluticasone-Salmeterol (ADVAIR DISKUS) 100-50 MCG/DOSE AEPB; USE 1 INHALATION TWICE  DAILY  Obesity ? Worsening with gapaentin, may switch to topamax but patient wants to try diet first.   Discussed med's effects and SE's. Screening labs and tests as requested with regular follow-up as recommended. Future Appointments  Date Time Provider [place]  04/13/2019   9:30 AM Vicie Mutters, PA-C [facility] None  01/10/2020  9:00 AM Vicie Mutters, PA-C [facility] None    HPI 58 y.o. female  presents for a complete physical.  She was seen last visit for multitude of symptoms, including nausea/AB pain, had negative AB US/negative HIDA, saw Dr. Tarri Glenn yesterday and will continue her PPI and add H2 as needed.   She was given gabapentin for anxiety/night sweats, taking 3 x a day and not having any issues, states that has improved She was treated with ABX for possible diverticulitis last visit, states the LLQ pain has improved but she will have some discomfort after intercourse still. Had pelvic US 2011.  She had negative autoimmune labs, negative tick borne illness labs.   BMI is Body mass index is 31.15 kg/m., she is working on diet and exercise. Wt Readings from Last 3 Encounters:  01/07/19 193 lb (87.5 kg)  01/06/19 194 lb 9.6 oz (88.3 kg)  12/02/18 187 lb (84.8 kg)    Her blood pressure has been controlled at home, today their BP is BP: 122/86 She does workout, walks daily.  She denies chest pain, shortness of breath, dizziness.  She had Ct coronary 02/2018 that showed mild disease, medically treated.   Has COPD/asthma and uses Advair and albuterol.   She is on  claritin, nasal spray.  CXR 12/2017  She is on cholesterol medication, started previously and denies myalgias. Her cholesterol is not at goal. The cholesterol last visit was:   Lab Results  Component Value Date   CHOL 239 (H) 12/30/2017   HDL 52 12/30/2017   LDLCALC 155 (H) 12/30/2017   TRIG  181 (H) 12/30/2017   CHOLHDL 4.6 12/30/2017   Last A1C in the office was:  Lab Results  Component Value Date   HGBA1C 5.4 12/30/2017   B12 was  Lab Results  Component Value Date   [id number] 12/02/2018   Patient is on Vitamin D supplement, 5000IU   Lab Results  Component Value Date   VD25OH 27 (L) 12/02/2018    Current Medications:    Current Outpatient Medications  (Cardiovascular):  .  diltiazem (CARDIZEM CD) 120 MG 24 hr capsule, TAKE 1 CAPSULE BY MOUTH  DAILY .  rosuvastatin (CRESTOR) 10 MG tablet, TAKE 1 TABLET BY MOUTH EVERY DAY .  metoprolol tartrate (LOPRESSOR) 50 MG tablet, Take 1 tablet (50 mg total) by mouth once for 1 dose.  Current Outpatient Medications (Respiratory):  .  albuterol (VENTOLIN HFA) 108 (90 Base) MCG/ACT inhaler, USE 2 PUFFS EVERY 6 HOURS  AS NEEDED FOR SHORTNESS OF  BREATH .  fluticasone (FLONASE) 50 MCG/ACT nasal spray, Place 1 spray into both nostrils daily. .  Fluticasone-Salmeterol (ADVAIR DISKUS) 100-50 MCG/DOSE AEPB, Use 1 Inhalation 2 x /day .  azelastine (ASTELIN) 0.1 % nasal spray, Place 2 sprays into both nostrils 2 (two) times daily. Use in each nostril as directed  Current Outpatient Medications (Analgesics):  .  meloxicam (MOBIC) 15 MG tablet, TAKE 1 TABLET BY MOUTH  DAILY AS NEEDED FOR PAIN   Current Outpatient Medications (Other):  .  cholecalciferol (VITAMIN D) 1000 UNITS tablet, Take 2,000 Units by mouth daily.  .  famotidine (PEPCID) 20 MG tablet, Take 1 tablet (20 mg total) by mouth 2 (two) times daily. Marland Kitchen  gabapentin (NEURONTIN) 300 MG capsule, Take 1 capsule (300 mg total) by mouth 3 (three) times daily. .  Multiple Vitamin (MULTIVITAMIN) capsule, Take 1 capsule by mouth daily. Marland Kitchen  omeprazole (PRILOSEC) 40 MG capsule, Take 1 capsule (40 mg total) by mouth daily.   Health Maintenance:   Immunization History  Administered Date(s) Administered  . Influenza Inj Mdck Quad Pf 12/13/2017  . Influenza,inj,Quad PF,6+ Mos 12/18/2018  . Pneumococcal-Unspecified 04/03/2009  . Td 04/03/2004   TDAP: 09/2013 at pharmacy Pneumovax: 2011 Prevnar 13: due at age 63 Flu vaccine: 2020 at [facility]  Zostavax: discussed, will get  Pap: 2019 ABNORMAL negative HPV MGM:127/2018 has appointment  DEXA: 09/2013 normal Colonoscopy: 08/2017 polyps due 5 years EGD: N/A CXR 12/2017  US pelvis 2011 DEE Dr. Sherlean Foot  DUE Dentist: Barney Drain, q 6 months  Medical History:  Past Medical History:  Diagnosis Date  . Allergy   . Arthritis   . Asthma   . Benign hematuria 2004  . Depression   . Hyperlipidemia   . Hypertension   . TMJ (temporomandibular joint syndrome)   . Vitamin D deficiency    Allergies Allergies  Allergen Reactions  . Other Shortness Of Breath    Household bleach- "takes my breath away"  . Azithromycin Hives  . Celexa [Citalopram Hydrobromide]     Myalgias  . Trintellix [Vortioxetine] Other (See Comments)    Dysphoria   . Vicodin [Hydrocodone-Acetaminophen] Nausea And Vomiting  . Wellbutrin [Bupropion]     anxiety   SURGICAL HISTORY She  has a past surgical history that includes Abdominal hysterectomy; Tubal ligation; Breast surgery (Left); Knee arthroscopy w/ debridement (Right, 11/2017); and Ankle surgery (Left, 2017). FAMILY HISTORY Her family history includes Arthritis in her mother; Breast cancer in her sister; Cancer in her mother; Hypertension in her father; Stroke in her father. SOCIAL HISTORY She  reports that she has been smoking. She has a 12.50 pack-year smoking history. She has never used smokeless tobacco. She reports current alcohol use. She reports current drug use. Drug: Marijuana.  Review of Systems  Constitutional: Positive for malaise/fatigue. Negative for chills, diaphoresis, fever and weight loss.  HENT: Negative for congestion, ear discharge, ear pain, hearing loss, nosebleeds, sore throat and tinnitus.   Eyes: Negative.   Respiratory: Negative for cough, hemoptysis, sputum production, shortness of breath, wheezing and stridor.   Cardiovascular: Negative.   Gastrointestinal: Negative for abdominal pain, blood in stool, constipation, diarrhea, heartburn, melena, nausea and vomiting.  Genitourinary: Negative for dysuria, flank pain, frequency, hematuria and urgency.  Musculoskeletal: Negative.   Skin: Negative.   Neurological: Negative.  Negative  for weakness and headaches.  Psychiatric/Behavioral: Positive for depression. Negative for hallucinations, memory loss, substance abuse and suicidal ideas. The patient is not nervous/anxious and does not have insomnia (improved with gabapentin).    Physical Exam: Estimated body mass index is 31.15 kg/m as calculated from the following:   Height as of this encounter: 5\' 6"  (1.676 m).   Weight as of this encounter: 193 lb (87.5 kg). BP 122/86   Pulse 74   Temp 97.9 F (36.6 C)   Ht 5\' 6"  (1.676 m)   Wt 193 lb (87.5 kg)   SpO2 97%   BMI 31.15 kg/m  General Appearance: Well nourished, in no apparent distress. Eyes: PERRLA, EOMs, conjunctiva no swelling or erythema, normal fundi and vessels. Sinuses: No Frontal/maxillary tenderness ENT/Mouth: Ext aud canals clear, normal light reflex with TMs without erythema, bulging.  Good dentition. No erythema, swelling, or exudate on post pharynx. Tonsils not swollen or erythematous. Hearing normal. Crowded mouth.  Neck: Supple, thyroid normal. No bruits Respiratory: Respiratory effort normal, BS equal bilaterally without rales, rhonchi, wheezing or stridor. Cardio: RRR without murmurs, rubs or gallops. Brisk peripheral pulses without edema.  Chest: symmetric, with normal excursions and percussion. Breasts: Symmetric, without lumps, nipple discharge, or retractions. Abdomen: Soft, +BS. RUQ pain, tender bilateral lower AB, no guarding, rebound, hernias, masses, or organomegaly. .  Lymphatics: Non tender without lymphadenopathy.  Gen: defer Musculoskeletal: Full ROM all peripheral extremities,5/5 strength, and normal gait.  Skin: Warm, dry without rashes, lesions, ecchymosis.  Neuro: Cranial nerves intact, reflexes equal bilaterally. Normal muscle tone, no cerebellar symptoms. Sensation intact.  Psych: Awake and oriented X 3, normal affect, Insight and Judgment appropriate.   EKG: WNL no changes. AORTA SCAN: defer   Vicie Mutters 11:36 AM

## 2019-01-07 ENCOUNTER — Ambulatory Visit: Payer: 59 | Admitting: Physician Assistant

## 2019-01-07 ENCOUNTER — Encounter: Payer: Self-pay | Admitting: Physician Assistant

## 2019-01-07 ENCOUNTER — Other Ambulatory Visit: Payer: Self-pay

## 2019-01-07 VITALS — BP 122/86 | HR 74 | Temp 97.9°F | Ht 66.0 in | Wt 193.0 lb

## 2019-01-07 DIAGNOSIS — F17200 Nicotine dependence, unspecified, uncomplicated: Secondary | ICD-10-CM

## 2019-01-07 DIAGNOSIS — Z136 Encounter for screening for cardiovascular disorders: Secondary | ICD-10-CM | POA: Diagnosis not present

## 2019-01-07 DIAGNOSIS — E559 Vitamin D deficiency, unspecified: Secondary | ICD-10-CM

## 2019-01-07 DIAGNOSIS — Z79899 Other long term (current) drug therapy: Secondary | ICD-10-CM

## 2019-01-07 DIAGNOSIS — Z0001 Encounter for general adult medical examination with abnormal findings: Secondary | ICD-10-CM

## 2019-01-07 DIAGNOSIS — I10 Essential (primary) hypertension: Secondary | ICD-10-CM | POA: Diagnosis not present

## 2019-01-07 DIAGNOSIS — Z0000 Encounter for general adult medical examination without abnormal findings: Secondary | ICD-10-CM

## 2019-01-07 DIAGNOSIS — E785 Hyperlipidemia, unspecified: Secondary | ICD-10-CM

## 2019-01-07 DIAGNOSIS — J449 Chronic obstructive pulmonary disease, unspecified: Secondary | ICD-10-CM

## 2019-01-07 DIAGNOSIS — Z124 Encounter for screening for malignant neoplasm of cervix: Secondary | ICD-10-CM

## 2019-01-07 DIAGNOSIS — E2839 Other primary ovarian failure: Secondary | ICD-10-CM

## 2019-01-07 DIAGNOSIS — F3341 Major depressive disorder, recurrent, in partial remission: Secondary | ICD-10-CM

## 2019-01-07 DIAGNOSIS — I2510 Atherosclerotic heart disease of native coronary artery without angina pectoris: Secondary | ICD-10-CM

## 2019-01-07 DIAGNOSIS — E538 Deficiency of other specified B group vitamins: Secondary | ICD-10-CM

## 2019-01-07 NOTE — Patient Instructions (Signed)
TOPAMAX Going to start you on topamax, start on 1/2 pill for 3-5 nights, can increase to a whole pill for 1-2 weeks.  Can increase to 1 pill at night and 1/2-1 pill in the AM.  This medication is good for weight loss, headaches, pain This medication can cause numbness, tingling and can cause brain fog- stop if you get these If you get blurry vision or have a history of glaucoma please stop this medication.  Let me know how you are doing with this.   Topiramate tablets What is this medicine? TOPIRAMATE (toe PYRE a mate) is used to treat seizures in adults or children with epilepsy. It is also used for the prevention of migraine headaches. This medicine may be used for other purposes; ask your health care provider or pharmacist if you have questions. COMMON BRAND NAME(S): Topamax, Topiragen What should I tell my health care provider before I take this medicine? They need to know if you have any of these conditions: -bleeding disorders -cirrhosis of the liver or liver disease -diarrhea -glaucoma -kidney stones or kidney disease -low blood counts, like low white cell, platelet, or red cell counts -lung disease like asthma, obstructive pulmonary disease, emphysema -metabolic acidosis -on a ketogenic diet -schedule for surgery or a procedure -suicidal thoughts, plans, or attempt; a previous suicide attempt by you or a family member -an unusual or allergic reaction to topiramate, other medicines, foods, dyes, or preservatives -pregnant or trying to get pregnant -breast-feeding How should I use this medicine? Take this medicine by mouth with a glass of water. Follow the directions on the prescription label. Do not crush or chew. You may take this medicine with meals. Take your medicine at regular intervals. Do not take it more often than directed. Talk to your pediatrician regarding the use of this medicine in children. Special care may be needed. While this drug may be prescribed for children  as young as 66 years of age for selected conditions, precautions do apply. Overdosage: If you think you have taken too much of this medicine contact a poison control center or emergency room at once. NOTE: This medicine is only for you. Do not share this medicine with others. What if I miss a dose? If you miss a dose, take it as soon as you can. If your next dose is to be taken in less than 6 hours, then do not take the missed dose. Take the next dose at your regular time. Do not take double or extra doses. What may interact with this medicine? Do not take this medicine with any of the following medications: -probenecid This medicine may also interact with the following medications: -acetazolamide -alcohol -amitriptyline -aspirin and aspirin-like medicines -birth control pills -certain medicines for depression -certain medicines for seizures -certain medicines that treat or prevent blood clots like warfarin, enoxaparin, dalteparin, apixaban, dabigatran, and rivaroxaban -digoxin -hydrochlorothiazide -lithium -medicines for pain, sleep, or muscle relaxation -metformin -methazolamide -NSAIDS, medicines for pain and inflammation, like ibuprofen or naproxen -pioglitazone -risperidone This list may not describe all possible interactions. Give your health care provider a list of all the medicines, herbs, non-prescription drugs, or dietary supplements you use. Also tell them if you smoke, drink alcohol, or use illegal drugs. Some items may interact with your medicine. What should I watch for while using this medicine? Visit your doctor or health care professional for regular checks on your progress. Do not stop taking this medicine suddenly. This increases the risk of seizures if you are using  this medicine to control epilepsy. Wear a medical identification bracelet or chain to say you have epilepsy or seizures, and carry a card that lists all your medicines. This medicine can decrease sweating  and increase your body temperature. Watch for signs of deceased sweating or fever, especially in children. Avoid extreme heat, hot baths, and saunas. Be careful about exercising, especially in hot weather. Contact your health care provider right away if you notice a fever or decrease in sweating. You should drink plenty of fluids while taking this medicine. If you have had kidney stones in the past, this will help to reduce your chances of forming kidney stones. If you have stomach pain, with nausea or vomiting and yellowing of your eyes or skin, call your doctor immediately. You may get drowsy, dizzy, or have blurred vision. Do not drive, use machinery, or do anything that needs mental alertness until you know how this medicine affects you. To reduce dizziness, do not sit or stand up quickly, especially if you are an older patient. Alcohol can increase drowsiness and dizziness. Avoid alcoholic drinks. If you notice blurred vision, eye pain, or other eye problems, seek medical attention at once for an eye exam. The use of this medicine may increase the chance of suicidal thoughts or actions. Pay special attention to how you are responding while on this medicine. Any worsening of mood, or thoughts of suicide or dying should be reported to your health care professional right away. This medicine may increase the chance of developing metabolic acidosis. If left untreated, this can cause kidney stones, bone disease, or slowed growth in children. Symptoms include breathing fast, fatigue, loss of appetite, irregular heartbeat, or loss of consciousness. Call your doctor immediately if you experience any of these side effects. Also, tell your doctor about any surgery you plan on having while taking this medicine since this may increase your risk for metabolic acidosis. Birth control pills may not work properly while you are taking this medicine. Talk to your doctor about using an extra method of birth control. Women  who become pregnant while using this medicine may enroll in the Conception Pregnancy Registry by calling [phone]. This registry collects information about the safety of antiepileptic drug use during pregnancy. What side effects may I notice from receiving this medicine? Side effects that you should report to your doctor or health care professional as soon as possible: -allergic reactions like skin rash, itching or hives, swelling of the face, lips, or tongue -decreased sweating and/or rise in body temperature -depression -difficulty breathing, fast or irregular breathing patterns -difficulty speaking -difficulty walking or controlling muscle movements -hearing impairment -redness, blistering, peeling or loosening of the skin, including inside the mouth -tingling, pain or numbness in the hands or feet -unusual bleeding or bruising -unusually weak or tired -worsening of mood, thoughts or actions of suicide or dying Side effects that usually do not require medical attention (report to your doctor or health care professional if they continue or are bothersome): -altered taste -back pain, joint or muscle aches and pains -diarrhea, or constipation -headache -loss of appetite -nausea -stomach upset, indigestion -tremors This list may not describe all possible side effects. Call your doctor for medical advice about side effects. You may report side effects to FDA at 1-800-FDA-1088. Where should I keep my medicine? Keep out of the reach of children. Store at room temperature between 15 and 30 degrees C (59 and 86 degrees F) in a tightly closed container. Protect from  moisture. Throw away any unused medicine after the expiration date. NOTE: This sheet is a summary. It may not cover all possible information. If you have questions about this medicine, talk to your doctor, pharmacist, or health care provider.  2018 Elsevier/Gold Standard (2013-03-01 23:17:57)  General  eating tips  What to Avoid . Avoid added sugars o Often added sugar can be found in processed foods such as many condiments, dry cereals, cakes, cookies, chips, crisps, crackers, candies, sweetened drinks, etc.  o Read labels and AVOID/DECREASE use of foods with the following in their ingredient list: Sugar, fructose, high fructose corn syrup, sucrose, glucose, maltose, dextrose, molasses, cane sugar, brown sugar, any type of syrup, agave nectar, etc.   . Avoid snacking in between meals- drink water or if you feel you need a snack, pick a high water content snack such as cucumbers, watermelon, or any veggie.  Marland Kitchen Avoid foods made with flour o If you are going to eat food made with flour, choose those made with whole-grains; and, minimize your consumption as much as is tolerable . Avoid processed foods o These foods are generally stocked in the middle of the grocery store.  o Focus on shopping on the perimeter of the grocery.  What to Include . Vegetables o GREEN LEAFY VEGETABLES: Kale, spinach, mustard greens, collard greens, cabbage, broccoli, etc. o OTHER: Asparagus, cauliflower, eggplant, carrots, peas, Brussel sprouts, tomatoes, bell peppers, zucchini, beets, cucumbers, etc. . Grains, seeds, and legumes o Beans: kidney beans, black eyed peas, garbanzo beans, black beans, pinto beans, etc. o Whole, unrefined grains: brown rice, barley, bulgur, oatmeal, etc. . Healthy fats  o Avoid highly processed fats such as vegetable oil o Examples of healthy fats: avocado, olives, virgin olive oil, dark chocolate (?72% Cocoa), nuts (peanuts, almonds, walnuts, cashews, pecans, etc.) o Please still do small amount of these healthy fats, they are dense in calories.  . Low - Moderate Intake of Animal Sources of Protein o Meat sources: chicken, [place], salmon, tuna. Limit to 4 ounces of meat at one time or the size of your palm. o Consider limiting dairy sources, but when choosing dairy focus on: PLAIN [place]  yogurt, cottage cheese, high-protein milk . Fruit o Choose berries

## 2019-01-08 LAB — CBC WITH DIFFERENTIAL/PLATELET
Absolute Monocytes: 593 cells/uL (ref 200–950)
Basophils Absolute: 38 cells/uL (ref 0–200)
Basophils Relative: 0.5 %
Eosinophils Absolute: 98 cells/uL (ref 15–500)
Eosinophils Relative: 1.3 %
HCT: 38.7 % (ref 35.0–45.0)
Hemoglobin: 13 g/dL (ref 11.7–15.5)
Lymphs Abs: 1965 cells/uL (ref 850–3900)
MCH: 31 pg (ref 27.0–33.0)
MCHC: 33.6 g/dL (ref 32.0–36.0)
MCV: 92.1 fL (ref 80.0–100.0)
MPV: 10.9 fL (ref 7.5–12.5)
Monocytes Relative: 7.9 %
Neutro Abs: 4808 cells/uL (ref 1500–7800)
Neutrophils Relative %: 64.1 %
Platelets: 282 10*3/uL (ref 140–400)
RBC: 4.2 10*6/uL (ref 3.80–5.10)
RDW: 12.6 % (ref 11.0–15.0)
Total Lymphocyte: 26.2 %
WBC: 7.5 10*3/uL (ref 3.8–10.8)

## 2019-01-08 LAB — URINALYSIS, ROUTINE W REFLEX MICROSCOPIC
Bacteria, UA: NONE SEEN /HPF
Bilirubin Urine: NEGATIVE
Glucose, UA: NEGATIVE
Hyaline Cast: NONE SEEN /LPF
Ketones, ur: NEGATIVE
Leukocytes,Ua: NEGATIVE
Nitrite: NEGATIVE
Protein, ur: NEGATIVE
Specific Gravity, Urine: 1.017 (ref 1.001–1.03)
WBC, UA: NONE SEEN /HPF (ref 0–5)
pH: 7 (ref 5.0–8.0)

## 2019-01-08 LAB — PAP, TP IMAGING W/ HPV RNA, RFLX HPV TYPE 16,18/45: HPV DNA High Risk: NOT DETECTED

## 2019-01-08 LAB — MICROALBUMIN / CREATININE URINE RATIO
Creatinine, Urine: 117 mg/dL (ref 20–275)
Microalb Creat Ratio: 3 mcg/mg creat (ref <30)
Microalb, Ur: 0.4 mg/dL

## 2019-01-08 LAB — PAP, TP IMAGING, WNL RFLX HPV

## 2019-01-15 ENCOUNTER — Other Ambulatory Visit: Payer: Self-pay

## 2019-01-15 DIAGNOSIS — Z20822 Contact with and (suspected) exposure to covid-19: Secondary | ICD-10-CM

## 2019-01-16 LAB — NOVEL CORONAVIRUS, NAA: SARS-CoV-2, NAA: NOT DETECTED

## 2019-01-21 MED ORDER — TOPIRAMATE 50 MG PO TABS: 50.0000 mg | ORAL_TABLET | Freq: Two times a day (BID) | ORAL | 2 refills | Status: DC

## 2019-02-09 HISTORY — PX: TOTAL KNEE ARTHROPLASTY: SHX125

## 2019-02-09 NOTE — Progress Notes (Signed)
Assessment and Plan:  Cristal was seen today for medical clearance.  Diagnoses and all orders for this visit:  Arthritis of right knee Scheduled for TKA with Dr. Berenice Primas on 02/17/2019 Surgical clearance will be provided pending labs today  Essential hypertension At goal; continue medications Monitor blood pressure at home; call if consistently over 130/80 Continue DASH diet.   Reminder to go to the ER if any CP, SOB, nausea, dizziness, severe HA, changes vision/speech, left arm numbness and tingling and jaw pain. -     CBC with Diff -     COMPLETE METABOLIC PANEL WITH GFR  Hyperlipidemia, unspecified hyperlipidemia type LDL goal <70; above goal at last check; titrate statin to goal  Continue low cholesterol diet and exercise.  Check lipid panel.  -     Lipid Profile  Smoker Actively cutting down with goal to quit prior to surgery Normal CXR 12/2017; anticipate this will be repeated prior to surgery Encouraged to maintain smoking cessation; strategies discussed  Chronic obstructive pulmonary disease, unspecified COPD type ([place]) Advised to stop smoking, CXR annually, continue meds.   Mild CAD Per cardiology workup <1 year ago; medical management recommended She requests management via our office Had WNL EKG 12/2018 Denies any cardiac symptoms; cleared for surgery with intermediate risk  Other abnormal glucose Screening diabetes as last more than 12 months ago  -     Hemoglobin A1c ([facility])  Bruises easily -     CBC with Diff -     PT (Prothrombin Time)  Further disposition pending results of labs. Discussed med's effects and SE's.   Over 30 minutes of exam, counseling, chart review, and critical decision making was performed.   Future Appointments  Date Time Provider [place]  04/13/2019  9:30 AM Vicie Mutters, PA-C [facility] None  01/10/2020  9:00 AM Vicie Mutters, PA-C [facility] None     ------------------------------------------------------------------------------------------------------------------   HPI BP 120/78   Pulse 79   Temp (!) 97.3 F (36.3 C)   Ht 5\' 6"  (1.676 m)   Wt 192 lb (87.1 kg)   SpO2 97%   BMI 30.99 kg/m   58 y.o.female presents for surgical clearance. She has Hypertension; Hyperlipidemia; Depression; Vitamin D deficiency; Obesity; Smoker; Estrogen deficiency; Asthma; Benign hematuria; B12 deficiency; Chronic obstructive pulmonary disease ([place]); Bruises easily; and Mild CAD on their problem list.  She is scheduled for 02/17/2019 with Dr. Berenice Primas for R total knee arthroplasty under spinal anesthesia and presents for surgical clearance and labs.   She is a smoker with mild COPD well controlled on advair, actively cutting down to quick smoking and reports down to 2 cigarettes daily.   She has dx of mild CAD; she was referred to cardiology Dr. Shirlee More last year for cardiac screening, underwent cardiac CT on 02/23/2018 which showed calcium score of 0.8 Agatston units, in 70th percentile for age/gender, with intermediate risk for future cardiac events. Also showed mild distal LAD stenosis (nonobstructive). She was recommended medical management. She has strongly requested to be managed her at our office and is declining further follow up with cardiology. She EKG at [facility] on 01/07/2019 which showed sinus brady, borderline 1st degree AV block, NSCPT. She denies any chest pain, dyspnea, LE edema, PND, dizziness and has been completely asymptomatic.   Her blood pressure has been controlled at home, today their BP is BP: 120/78   She is on cholesterol medication (rosuvastatin 10 mg daily) and denies myalgias. Her cholesterol is not at goal, hasn't been checked  recently. The cholesterol last visit was:   Lab Results  Component Value Date   CHOL 239 (H) 12/30/2017   HDL 52 12/30/2017   LDLCALC 155 (H) 12/30/2017   TRIG 181 (H) 12/30/2017   CHOLHDL 4.6  12/30/2017   Last A1C in the office was:  Lab Results  Component Value Date   HGBA1C 5.4 12/30/2017      Past Medical History:  Diagnosis Date  . Allergy   . Arthritis   . Asthma   . Benign hematuria 2004  . Depression   . Hyperlipidemia   . Hypertension   . TMJ (temporomandibular joint syndrome)   . Vitamin D deficiency      Allergies  Allergen Reactions  . Other Shortness Of Breath    Household bleach- "takes my breath away"  . Azithromycin Hives  . Celexa [Citalopram Hydrobromide]     Myalgias  . Trintellix [Vortioxetine] Other (See Comments)    Dysphoria   . Vicodin [Hydrocodone-Acetaminophen] Nausea And Vomiting  . Wellbutrin [Bupropion]     anxiety    Current Outpatient Medications on File Prior to Visit  Medication Sig  . albuterol (VENTOLIN HFA) 108 (90 Base) MCG/ACT inhaler USE 2 PUFFS EVERY 6 HOURS  AS NEEDED FOR SHORTNESS OF  BREATH  . azelastine (ASTELIN) 0.1 % nasal spray Place 2 sprays into both nostrils 2 (two) times daily. Use in each nostril as directed  . cholecalciferol (VITAMIN D) 1000 UNITS tablet Take 2,000 Units by mouth daily.   Marland Kitchen diltiazem (CARDIZEM CD) 120 MG 24 hr capsule TAKE 1 CAPSULE BY MOUTH  DAILY  . famotidine (PEPCID) 20 MG tablet Take 1 tablet (20 mg total) by mouth 2 (two) times daily.  . fluticasone (FLONASE) 50 MCG/ACT nasal spray Place 1 spray into both nostrils daily.  . Fluticasone-Salmeterol (ADVAIR DISKUS) 100-50 MCG/DOSE AEPB Use 1 Inhalation 2 x /day  . meloxicam (MOBIC) 15 MG tablet TAKE 1 TABLET BY MOUTH  DAILY AS NEEDED FOR PAIN  . metoprolol tartrate (LOPRESSOR) 50 MG tablet Take 1 tablet (50 mg total) by mouth once for 1 dose.  . Multiple Vitamin (MULTIVITAMIN) capsule Take 1 capsule by mouth daily.  Marland Kitchen omeprazole (PRILOSEC) 40 MG capsule Take 1 capsule (40 mg total) by mouth daily.  . rosuvastatin (CRESTOR) 10 MG tablet TAKE 1 TABLET BY MOUTH EVERY DAY  . topiramate (TOPAMAX) 50 MG tablet Take 1 tablet (50 mg total)  by mouth 2 (two) times daily.   No current facility-administered medications on file prior to visit.     ROS: Review of Systems  Constitutional: Negative for malaise/fatigue and weight loss.  HENT: Negative for hearing loss and tinnitus.   Eyes: Negative for blurred vision and double vision.  Respiratory: Negative for cough, shortness of breath and wheezing.   Cardiovascular: Negative for chest pain, palpitations, orthopnea, claudication and leg swelling.  Gastrointestinal: Negative for abdominal pain, blood in stool, constipation, diarrhea, heartburn, melena, nausea and vomiting.  Genitourinary: Negative.   Musculoskeletal: Positive for joint pain (right knee). Negative for myalgias.  Skin: Negative for rash.  Neurological: Negative for dizziness, tingling, sensory change, weakness and headaches.  Endo/Heme/Allergies: Negative for polydipsia.  Psychiatric/Behavioral: Negative.   All other systems reviewed and are negative.   Physical Exam:  BP 120/78   Pulse 79   Temp (!) 97.3 F (36.3 C)   Ht 5\' 6"  (1.676 m)   Wt 192 lb (87.1 kg)   SpO2 97%   BMI 30.99 kg/m   General  Appearance: Well nourished, in no apparent distress. Eyes: PERRLA, EOMs, conjunctiva no swelling or erythema Sinuses: No Frontal/maxillary tenderness ENT/Mouth: Ext aud canals clear, TMs without erythema, bulging. No erythema, swelling, or exudate on post pharynx.  Tonsils not swollen or erythematous. Hearing normal.  Neck: Supple, thyroid normal.  Respiratory: Respiratory effort normal, BS equal bilaterally without rales, rhonchi, wheezing or stridor.  Cardio: RRR with no MRGs. Brisk peripheral pulses without edema.  Abdomen: Soft, + BS.  Non tender, no guarding, rebound, hernias, masses. Lymphatics: Non tender without lymphadenopathy.  Musculoskeletal: reduced ROM, some crepitus without laxity or effusion of R knee; otherwise full ROM, no obvious deformity, 5/5 strength, normal gait.  Skin: Warm, dry  without rashes, lesions, ecchymosis.  Neuro: Cranial nerves intact. Normal muscle tone, no cerebellar symptoms. Sensation intact.  Psych: Awake and oriented X 3, normal affect, Insight and Judgment appropriate.     Izora Ribas, NP 9:18 AM [facility]

## 2019-02-10 ENCOUNTER — Encounter: Payer: Self-pay | Admitting: Adult Health

## 2019-02-10 ENCOUNTER — Other Ambulatory Visit: Payer: Self-pay

## 2019-02-10 ENCOUNTER — Ambulatory Visit: Payer: 59 | Admitting: Adult Health

## 2019-02-10 VITALS — BP 120/78 | HR 79 | Temp 97.3°F | Ht 66.0 in | Wt 192.0 lb

## 2019-02-10 DIAGNOSIS — E785 Hyperlipidemia, unspecified: Secondary | ICD-10-CM

## 2019-02-10 DIAGNOSIS — M1711 Unilateral primary osteoarthritis, right knee: Secondary | ICD-10-CM | POA: Diagnosis not present

## 2019-02-10 DIAGNOSIS — I10 Essential (primary) hypertension: Secondary | ICD-10-CM | POA: Diagnosis not present

## 2019-02-10 DIAGNOSIS — I2510 Atherosclerotic heart disease of native coronary artery without angina pectoris: Secondary | ICD-10-CM

## 2019-02-10 DIAGNOSIS — R233 Spontaneous ecchymoses: Secondary | ICD-10-CM

## 2019-02-10 DIAGNOSIS — R7309 Other abnormal glucose: Secondary | ICD-10-CM

## 2019-02-10 DIAGNOSIS — J449 Chronic obstructive pulmonary disease, unspecified: Secondary | ICD-10-CM

## 2019-02-10 DIAGNOSIS — F17200 Nicotine dependence, unspecified, uncomplicated: Secondary | ICD-10-CM | POA: Diagnosis not present

## 2019-02-10 DIAGNOSIS — R238 Other skin changes: Secondary | ICD-10-CM

## 2019-02-10 NOTE — Patient Instructions (Signed)
Preparing for Knee Replacement °Getting prepared before knee replacement surgery can make your recovery easier and more comfortable. This document provides some tips and guidelines that will help you prepare for your surgery. °Talk with your health care provider so you can learn what to expect before, during, and after surgery. Ask questions if you do not understand something. °To ease concerns about your financial responsibilities, call your insurance company as soon as you decide to have surgery. Ask how much of your surgery and hospital stay will be covered. Also ask about coverage for medical equipment, rehabilitation facilities, and home care. °How should I arrange for help? °In the first couple weeks after surgery, it will likely be harder for you to do some of your regular activities. You may get tired easily, and you will have limited movement in your leg. Follow these guidelines to make sure you have all the help you need after your surgery: °· Plan to have someone take you home from the hospital. Your health care provider will tell you how many days you can expect to be in the hospital. °· Cancel all your work, caregiving, and volunteer responsibilities for at least 4-6 weeks after your surgery. °· Plan to have someone stay with you day and night for the first week. This person should be someone you are comfortable with. You may need this person to help you with your exercises and personal care, such as bathing and using the toilet. °· If you live alone, arrange for someone to take care of your home and pets for the first 4-6 weeks after surgery. °· Arrange for drivers to take you to and from follow-up appointments, the grocery store, and other places you may need to go for at least 4-6 weeks. °· Consider applying for a disabled parking permit. To get an application, contact the Department of Motor Vehicles or your health care provider's office. °How should I prepare my home? ° °  ° °· Pick a recovery  spot, but do not plan on recovering in bed. Sitting upright is better for your health. You may want to use a recliner with a small table nearby. Place the items you use most frequently on that table. These may include the TV remote, a cordless phone, a cell phone, a book or laptop computer, and a water glass. °· To see if you will be able to move around in your home with a walker, hold your hands out about 6 inches (15 cm) from your sides, and walk from your recovery spot to your kitchen and bathroom. Then walk from your bed to the bathroom. If you do not hit anything with your hands, you will have enough room for a walker. °· Minimize the use of stairs after you return home to reduce your risk of falling or tripping. °· Remove all clutter from your floors. Also remove any throw rugs. This will help you avoid tripping after your surgery. °· Move the items you use most often in your kitchen, bathroom, and bedroom to shelves and drawers that are at countertop height. °· Prepare a few meals to freeze and reheat later. °· Consider getting safety equipment that will be helpful during your recovery, such as: °? Grab bars added in the shower and near the toilet. °? A raised toilet seat to help you get on and off the toilet more easily. °? A tub or shower bench. °How should I prepare my body? °· Have a preoperative exam. °? During the exam, your health   care provider will make sure that your body is healthy enough to safely have the surgery. ? When you go to the exam, bring a complete list of all your medicines and supplements, including herbs and vitamins. ? You may need to have additional tests to ensure your safety.  Have elective dental care and routine cleanings done before your surgery. Germs from anywhere in your body, including your mouth, can travel to your new joint and infect it. It is important that you do not have any dental work done for at least 3 months after your surgery.  Maintain a healthy diet. Do  not change your diet before surgery unless your health care provider tells you to do that.  Do not use any products that contain nicotine or tobacco, such as cigarettes and e-cigarettes. These can delay bone healing after surgery. If you need help quitting, ask your health care provider.  Tell your health care provider if: ? You develop any skin infections or skin irritations. You may need to improve the condition of your skin before surgery. ? You have a fever, a cold, or any other illness in the week before your surgery.  Do not drink any alcohol for at least 48 hours before surgery.  The day before your surgery, follow instructions from your health care provider about showering, eating, drinking, and taking medicines. These directions are for your safety.  Talk to your health care provider about doing exercises before your surgery. ? Be sure to follow the exercise program only as directed by your health care provider. ? Doing these exercises in the weeks before your surgery may help reduce pain and improve function after surgery. Summary  Getting prepared before knee replacement surgery can make your recovery easier and more comfortable.  Prepare your home and arrange for help at home.  Keep all of your preoperative appointments to ensure that you are ready for your surgery.  Plan to have someone take you home from the hospital and stay with you day and night for the first week. This information is not intended to replace advice given to you by your health care provider. Make sure you discuss any questions you have with your health care provider. Document Released: 06/01/2010 Document Revised: 04/14/2017 Document Reviewed: 04/14/2017 Elsevier Patient Education  2020 [employer].

## 2019-02-11 LAB — CBC WITH DIFFERENTIAL/PLATELET
Absolute Monocytes: 657 cells/uL (ref 200–950)
Basophils Absolute: 52 cells/uL (ref 0–200)
Basophils Relative: 0.8 %
Eosinophils Absolute: 111 cells/uL (ref 15–500)
Eosinophils Relative: 1.7 %
HCT: 39.7 % (ref 35.0–45.0)
Hemoglobin: 13.7 g/dL (ref 11.7–15.5)
Lymphs Abs: 1716 cells/uL (ref 850–3900)
MCH: 31.6 pg (ref 27.0–33.0)
MCHC: 34.5 g/dL (ref 32.0–36.0)
MCV: 91.7 fL (ref 80.0–100.0)
MPV: 11 fL (ref 7.5–12.5)
Monocytes Relative: 10.1 %
Neutro Abs: 3965 cells/uL (ref 1500–7800)
Neutrophils Relative %: 61 %
Platelets: 304 10*3/uL (ref 140–400)
RBC: 4.33 10*6/uL (ref 3.80–5.10)
RDW: 12.4 % (ref 11.0–15.0)
Total Lymphocyte: 26.4 %
WBC: 6.5 10*3/uL (ref 3.8–10.8)

## 2019-02-11 LAB — COMPLETE METABOLIC PANEL WITH GFR
AG Ratio: 2.3 (calc) (ref 1.0–2.5)
ALT: 15 U/L (ref 6–29)
AST: 16 U/L (ref 10–35)
Albumin: 4.4 g/dL (ref 3.6–5.1)
Alkaline phosphatase (APISO): 98 U/L (ref 37–153)
BUN: 15 mg/dL (ref 7–25)
CO2: 22 mmol/L (ref 20–32)
Calcium: 9.6 mg/dL (ref 8.6–10.4)
Chloride: 109 mmol/L (ref 98–110)
Creat: 0.86 mg/dL (ref 0.50–1.05)
GFR, Est African American: 86 mL/min/{1.73_m2} (ref >=60)
GFR, Est Non African American: 74 mL/min/{1.73_m2} (ref >=60)
Globulin: 1.9 g/dL (calc) (ref 1.9–3.7)
Glucose, Bld: 94 mg/dL (ref 65–99)
Potassium: 4.1 mmol/L (ref 3.5–5.3)
Sodium: 140 mmol/L (ref 135–146)
Total Bilirubin: 0.7 mg/dL (ref 0.2–1.2)
Total Protein: 6.3 g/dL (ref 6.1–8.1)

## 2019-02-11 LAB — LIPID PANEL
Cholesterol: 127 mg/dL (ref <200)
HDL: 50 mg/dL (ref >=50)
LDL Cholesterol (Calc): 57 mg/dL (calc)
Non-HDL Cholesterol (Calc): 77 mg/dL (calc) (ref <130)
Total CHOL/HDL Ratio: 2.5 (calc) (ref <5.0)
Triglycerides: 121 mg/dL (ref <150)

## 2019-02-11 LAB — HEMOGLOBIN A1C
Hgb A1c MFr Bld: 5.3 % of total Hgb (ref <5.7)
Mean Plasma Glucose: 105 (calc)
eAG (mmol/L): 5.8 (calc)

## 2019-02-11 LAB — PROTIME-INR
INR: 1
Prothrombin Time: 10.5 s (ref 9.0–11.5)

## 2019-02-12 ENCOUNTER — Ambulatory Visit (INDEPENDENT_AMBULATORY_CARE_PROVIDER_SITE_OTHER): Payer: 59 | Admitting: Adult Health

## 2019-02-12 ENCOUNTER — Emergency Department (HOSPITAL_COMMUNITY): Payer: 59

## 2019-02-12 ENCOUNTER — Telehealth: Payer: Self-pay | Admitting: Physician Assistant

## 2019-02-12 ENCOUNTER — Emergency Department (HOSPITAL_COMMUNITY)
Admission: EM | Admit: 2019-02-12 | Discharge: 2019-02-14 | Disposition: A | Discharge status: Home / Self Care | Payer: 59 | Attending: Emergency Medicine | Admitting: Emergency Medicine

## 2019-02-12 ENCOUNTER — Encounter: Payer: Self-pay | Admitting: Adult Health

## 2019-02-12 ENCOUNTER — Other Ambulatory Visit: Payer: Self-pay

## 2019-02-12 VITALS — BP 146/92 | HR 108 | Temp 97.8°F | Resp 28

## 2019-02-12 DIAGNOSIS — J45909 Unspecified asthma, uncomplicated: Secondary | ICD-10-CM | POA: Diagnosis not present

## 2019-02-12 DIAGNOSIS — R0682 Tachypnea, not elsewhere classified: Secondary | ICD-10-CM

## 2019-02-12 DIAGNOSIS — R202 Paresthesia of skin: Secondary | ICD-10-CM | POA: Insufficient documentation

## 2019-02-12 DIAGNOSIS — R0602 Shortness of breath: Secondary | ICD-10-CM

## 2019-02-12 DIAGNOSIS — I10 Essential (primary) hypertension: Secondary | ICD-10-CM | POA: Insufficient documentation

## 2019-02-12 DIAGNOSIS — R197 Diarrhea, unspecified: Secondary | ICD-10-CM | POA: Insufficient documentation

## 2019-02-12 DIAGNOSIS — Z789 Other specified health status: Secondary | ICD-10-CM | POA: Diagnosis not present

## 2019-02-12 DIAGNOSIS — Z79899 Other long term (current) drug therapy: Secondary | ICD-10-CM | POA: Insufficient documentation

## 2019-02-12 DIAGNOSIS — F1210 Cannabis abuse, uncomplicated: Secondary | ICD-10-CM | POA: Insufficient documentation

## 2019-02-12 DIAGNOSIS — R110 Nausea: Secondary | ICD-10-CM | POA: Diagnosis not present

## 2019-02-12 DIAGNOSIS — Z20828 Contact with and (suspected) exposure to other viral communicable diseases: Secondary | ICD-10-CM | POA: Diagnosis not present

## 2019-02-12 DIAGNOSIS — F17210 Nicotine dependence, cigarettes, uncomplicated: Secondary | ICD-10-CM | POA: Insufficient documentation

## 2019-02-12 LAB — CBC WITH DIFFERENTIAL/PLATELET
Abs Immature Granulocytes: 0.02 10*3/uL (ref 0.00–0.07)
Basophils Absolute: 0 10*3/uL (ref 0.0–0.1)
Basophils Relative: 0 %
Eosinophils Absolute: 0 10*3/uL (ref 0.0–0.5)
Eosinophils Relative: 0 %
HCT: 42.6 % (ref 36.0–46.0)
Hemoglobin: 14.2 g/dL (ref 12.0–15.0)
Immature Granulocytes: 0 %
Lymphocytes Relative: 19 %
Lymphs Abs: 1.9 10*3/uL (ref 0.7–4.0)
MCH: 30.3 pg (ref 26.0–34.0)
MCHC: 33.3 g/dL (ref 30.0–36.0)
MCV: 90.8 fL (ref 80.0–100.0)
Monocytes Absolute: 0.7 10*3/uL (ref 0.1–1.0)
Monocytes Relative: 7 %
Neutro Abs: 7.2 10*3/uL (ref 1.7–7.7)
Neutrophils Relative %: 74 %
Platelets: 362 10*3/uL (ref 150–400)
RBC: 4.69 MIL/uL (ref 3.87–5.11)
RDW: 12.7 % (ref 11.5–15.5)
WBC: 9.8 10*3/uL (ref 4.0–10.5)
nRBC: 0 % (ref 0.0–0.2)

## 2019-02-12 LAB — HEPATIC FUNCTION PANEL
ALT: 22 U/L (ref 0–44)
AST: 19 U/L (ref 15–41)
Albumin: 3.9 g/dL (ref 3.5–5.0)
Alkaline Phosphatase: 86 U/L (ref 38–126)
Bilirubin, Direct: 0.1 mg/dL (ref 0.0–0.2)
Indirect Bilirubin: 0.7 mg/dL (ref 0.3–0.9)
Total Bilirubin: 0.8 mg/dL (ref 0.3–1.2)
Total Protein: 6.6 g/dL (ref 6.5–8.1)

## 2019-02-12 LAB — BASIC METABOLIC PANEL
Anion gap: 13 (ref 5–15)
BUN: 14 mg/dL (ref 6–20)
CO2: 16 mmol/L — ABNORMAL LOW (ref 22–32)
Calcium: 10.1 mg/dL (ref 8.9–10.3)
Chloride: 113 mmol/L — ABNORMAL HIGH (ref 98–111)
Creatinine, Ser: 0.94 mg/dL (ref 0.44–1.00)
GFR calc Af Amer: >60 mL/min (ref >60)
GFR calc non Af Amer: >60 mL/min (ref >60)
Glucose, Bld: 110 mg/dL — ABNORMAL HIGH (ref 70–99)
Potassium: 3.7 mmol/L (ref 3.5–5.1)
Sodium: 142 mmol/L (ref 135–145)

## 2019-02-12 LAB — TROPONIN I (HIGH SENSITIVITY): Troponin I (High Sensitivity): 2 ng/L (ref <18)

## 2019-02-12 LAB — BLOOD GAS, VENOUS
Acid-base deficit: 2.4 mmol/L — ABNORMAL HIGH (ref 0.0–2.0)
Bicarbonate: 17.4 mmol/L — ABNORMAL LOW (ref 20.0–28.0)
O2 Saturation: 59.3 %
Patient temperature: 98.6
pCO2, Ven: 19.5 mmHg — CL (ref 44.0–60.0)
pH, Ven: 7.559 — ABNORMAL HIGH (ref 7.250–7.430)
pO2, Ven: <31 mmHg — CL (ref 32.0–45.0)

## 2019-02-12 LAB — LIPASE, BLOOD: Lipase: 28 U/L (ref 11–51)

## 2019-02-12 LAB — SARS CORONAVIRUS 2 (TAT 6-24 HRS): SARS Coronavirus 2: NEGATIVE

## 2019-02-12 MED ORDER — SODIUM CHLORIDE 0.9 % IV SOLN
Freq: Once | INTRAVENOUS | Status: AC
  Administered 2019-02-12: 20:00:00 via INTRAVENOUS

## 2019-02-12 MED ORDER — SODIUM CHLORIDE 0.9 % IV BOLUS: 500.0000 mL | Freq: Once | INTRAVENOUS | Status: DC

## 2019-02-12 MED ORDER — BUSPIRONE HCL 15 MG PO TABS: 15.0000 mg | ORAL_TABLET | Freq: Three times a day (TID) | ORAL | 0 refills | Status: DC

## 2019-02-12 MED ORDER — IOHEXOL 350 MG/ML SOLN
100.0000 mL | Freq: Once | INTRAVENOUS | Status: AC | PRN
  Administered 2019-02-12: 100 mL via INTRAVENOUS

## 2019-02-12 MED ORDER — ONDANSETRON HCL 4 MG/2ML IJ SOLN
4.0000 mg | Freq: Once | INTRAMUSCULAR | Status: AC
  Administered 2019-02-12: 4 mg via INTRAVENOUS
  Filled 2019-02-12: qty 2

## 2019-02-12 NOTE — ED Notes (Signed)
ED Provider at bedside. 

## 2019-02-12 NOTE — ED Notes (Signed)
Pt provided with phone to call and update husband. Pt advised that further updates will be provided once imaging results.

## 2019-02-12 NOTE — ED Triage Notes (Signed)
Patient reports shortness of breath since yesterday. States her pcp tells her it is anxiety. Patient says she now feels numb all over and cannot breathe. Patient reports she had COVID 19 rapid test completed today and it was negative. Patient reports history of asthma. o2 sat 100% room air. ED EKG completed showing NSR. Triage nurse and primary nurse able to verbally direct patient to control breathing. Patient resting quietly in bed at this time. No signs of distress.

## 2019-02-12 NOTE — Discharge Instructions (Signed)
Please make sure you are drinking extra fluids.  Please follow up with your primary care doctor in the next week.

## 2019-02-12 NOTE — Progress Notes (Signed)
Assessment and Plan:  Diagnoses and all orders for this visit:  Tachypnea/Shortness of breath/In distress Presented for evaluation due to distress/dyspnea reportedly ongoing for 2 days Endorses dyspnea, chest tightness, abdominal pain, unable to keep water down all day yesterday, 6+ watery diarrhea Did just have negative rapid covid 19 screen today for pre-op clearance Reported no improvement in symptoms with albuterol  Requested medication for panic attack, advised cannot give benzo due to current opioid use; she has been prescribed buspirone by another provider for this I cannot r/o PE, needs EKG, stat labs due to emesis/diarrhea yesterday, possible dehydration/electrolyte abnormality, lacking resources here necessary for appropriate workup and intervention Advised EMS to present for ED STAT workup in light of her symptoms and distress Declines EMS, departed premises in personal car accompanied by her husband I personally called [facility] ED triage nurse to advise impending arrival and my concern for need to r/o PE and need for EKG and STAT labs  Further disposition pending results of labs. Discussed med's effects and SE's.   Over 15 minutes of exam, counseling, chart review, and critical decision making was performed.   Future Appointments  Date Time Provider [place]  04/13/2019  9:30 AM Vicie Mutters, PA-C [facility] None  01/10/2020  9:00 AM Vicie Mutters, PA-C [facility] None    ------------------------------------------------------------------------------------------------------------------   HPI BP (!) 146/92   Pulse (!) 108   Temp 97.8 F (36.6 C)   Resp (!) 28   SpO2 98%   58 y.o.female with hx of mild CAD, htn, hyperlipidemia, COPD/asthma depression, current smoker presents as a walk in immediately prior to closing time at our clinic accompanied by her husband for evaluation due to shortness of breath and rapid breathing and requesting a breathing treatment.    She contacted office earlier today reporting panic attack, requesting medication and was prescribed buspar due to current prescription of opioids, was advised to present to ED for any CP, dyspnea, nausea, dizziness, HA, changes in vision/speech.   She is tachypneic, obviously in distress, unable to speak more than a few words at a time.   She reports has been short of breath since night before last, feeling like she can't breathe. She called her husband in distress and has been this way since the night before last except briefly when she was given a valium. She does not have recorded history of panic attacks. She has been prescribed valium in the past, last #90 on 06/08/2018 per PDMP.   She reports yesterday AM work up unable to keep anything, food or water, 6+ watery diarrhea yesterday. Generalized abdominal pain. She endorses constantly feeling short of breath, tightness in chest, cramping in extremities, tingling/cold extremities. She has COPD and asthma, takes Advair, PRN albuterol. She has tried albuterol a few times since this began without benefit. Denies coughing.   She is having knee surgery by Dr. Berenice Primas next week and just had rapid covid 19 test prior to arrival which was negative.   Past Medical History:  Diagnosis Date  . Allergy   . Arthritis   . Asthma   . Benign hematuria 2004  . Depression   . Hyperlipidemia   . Hypertension   . TMJ (temporomandibular joint syndrome)   . Vitamin D deficiency      Allergies  Allergen Reactions  . Other Shortness Of Breath    Household bleach- "takes my breath away"  . Azithromycin Hives  . Celexa [Citalopram Hydrobromide]     Myalgias  . Trintellix [Vortioxetine] Other (  See Comments)    Dysphoria   . Vicodin [Hydrocodone-Acetaminophen] Nausea And Vomiting  . Wellbutrin [Bupropion]     anxiety    Current Outpatient Medications on File Prior to Visit  Medication Sig  . albuterol (VENTOLIN HFA) 108 (90 Base) MCG/ACT inhaler USE  2 PUFFS EVERY 6 HOURS  AS NEEDED FOR SHORTNESS OF  BREATH  . azelastine (ASTELIN) 0.1 % nasal spray Place 2 sprays into both nostrils 2 (two) times daily. Use in each nostril as directed  . busPIRone (BUSPAR) 15 MG tablet Take 1 tablet (15 mg total) by mouth 3 (three) times daily.  . cholecalciferol (VITAMIN D) 1000 UNITS tablet Take 2,000 Units by mouth daily.   Marland Kitchen diltiazem (CARDIZEM CD) 120 MG 24 hr capsule TAKE 1 CAPSULE BY MOUTH  DAILY  . famotidine (PEPCID) 20 MG tablet Take 1 tablet (20 mg total) by mouth 2 (two) times daily.  . fluticasone (FLONASE) 50 MCG/ACT nasal spray Place 1 spray into both nostrils daily.  . Fluticasone-Salmeterol (ADVAIR DISKUS) 100-50 MCG/DOSE AEPB Use 1 Inhalation 2 x /day  . meloxicam (MOBIC) 15 MG tablet TAKE 1 TABLET BY MOUTH  DAILY AS NEEDED FOR PAIN  . metoprolol tartrate (LOPRESSOR) 50 MG tablet Take 1 tablet (50 mg total) by mouth once for 1 dose.  . Multiple Vitamin (MULTIVITAMIN) capsule Take 1 capsule by mouth daily.  Marland Kitchen omeprazole (PRILOSEC) 40 MG capsule Take 1 capsule (40 mg total) by mouth daily.  . rosuvastatin (CRESTOR) 10 MG tablet TAKE 1 TABLET BY MOUTH EVERY DAY  . topiramate (TOPAMAX) 50 MG tablet Take 1 tablet (50 mg total) by mouth 2 (two) times daily.   No current facility-administered medications on file prior to visit.     ROS: all negative except above.   Physical Exam:  BP (!) 146/92   Pulse (!) 108   Temp 97.8 F (36.6 C)   Resp (!) 28   SpO2 98%   General Appearance: Well nourished, in obvious distress with tachypnea.  Eyes: PERRLA, conjunctiva no swelling or erythema ENT/Mouth: Hearing normal.  Neck: Supple Respiratory: Respiratory effort increased, tachypneic, BS present throughout without rales, rhonchi, wheezing or stridor.  Cardio: Mildly tachycardic, regular rhythm, normal S1/S2 with no MRGs. Intact peripheral pulses, cold hands.  Abdomen: Soft, + BS.  Generalized tenderness.  Lymphatics: Non tender without  lymphadenopathy.  Musculoskeletal: no obvious deformity, gait not assessed. Skin: Warm, dry without diaphoesis; good tone/color.  Neuro: Normal muscle tone Psych: Awake and oriented X 3, anxious affect    Izora Ribas, NP 12:18 PM [facility]

## 2019-02-12 NOTE — Telephone Encounter (Signed)
-----   Message from Elenor Quinones, Bre[place] sent at 02/12/2019  8:37 AM EST ----- Regarding: poss panic attack Contact: [phone] She is having a panic attack & wants to speak with you about this

## 2019-02-12 NOTE — ED Provider Notes (Signed)
I assumed care of patient at shift change, please see note by previous provider for full H&P.  Briefly patient is here for evaluation of shortness of breath since last night.  She has a history of asthma.  She called her primary care doctor to ask for a benzo for a possible anxiety attack however was sent here to rule out PE.    Physical Exam  BP (!) 174/80   Pulse 67   Temp 98 F (36.7 C) (Oral)   Resp 15   Ht 5\' 6"  (1.676 m)   Wt 87.1 kg   SpO2 98%   BMI 30.99 kg/m   Physical Exam Vitals signs and nursing note reviewed.  Constitutional:      General: She is not in acute distress.    Appearance: She is well-developed. She is not diaphoretic.  HENT:     Head: Normocephalic and atraumatic.  Eyes:     General: No scleral icterus.       Right eye: No discharge.        Left eye: No discharge.     Conjunctiva/sclera: Conjunctivae normal.  Neck:     Musculoskeletal: Normal range of motion.  Cardiovascular:     Rate and Rhythm: Normal rate and regular rhythm.  Pulmonary:     Effort: Pulmonary effort is normal. No tachypnea or respiratory distress.     Breath sounds: Normal breath sounds. No stridor. No decreased breath sounds, wheezing, rhonchi or rales.  Chest:     Chest wall: No tenderness.  Abdominal:     General: There is no distension.  Musculoskeletal:        General: No deformity.     Right lower leg: No edema.     Left lower leg: No edema.  Skin:    General: Skin is warm and dry.  Neurological:     Mental Status: She is alert.     Motor: No abnormal muscle tone.  Psychiatric:     Comments: Mildly anxious     ED Course/Procedures   Clinical Course as of Feb 12 2323  Fri Feb 12, 2019  1640 Patient was reevaluated.  She reports that she feels unchanged.  She tells me that she has been unable to tolerate p.o. fluids for the past 2 days.  CTA PE study does not show evidence of PE.  Added on hepatic function panel, lipase given nausea vomiting and diarrhea.  In  addition added on troponin given significant shortness of breath and generally feeling unwell.  CT scan did not show acute abnormalities in her chest.  She has not had any treatment of her nausea.  We will give fluid bolus with infusion, along with nausea medications and obtain repeat EKG.     [EH]  1926 Spoke with patient.  She reports feeling better after fluids and nausea medicine and wishes to go home.  Will p.o. challenge.  She does admit when she was numb all over earlier that it was primarily around her mouth and on her hands and toes.    [EH]  2035 Patient passed PO challenge, feels well, wishes to go home.    [EH]    Clinical Course User Index [EH] Lorin Glass, PA-C    Procedures  Ct Head Wo Contrast  Result Date: 02/12/2019 CLINICAL DATA:  Paresthesias EXAM: CT HEAD WITHOUT CONTRAST TECHNIQUE: Contiguous axial images were obtained from the base of the skull through the vertex without intravenous contrast. COMPARISON:  None. FINDINGS: Brain: There is  no acute intracranial hemorrhage, mass-effect, or edema. Gray-white differentiation is preserved. There is no extra-axial fluid collection. Ventricles and sulci are within normal limits in size and configuration. Vascular: No hyperdense vessel or unexpected calcification. Skull: Calvarium is unremarkable. Sinuses/Orbits: No acute finding. Other: None. IMPRESSION: No acute intracranial abnormality. Electronically Signed   By: Macy Mis M.D.   On: 02/12/2019 14:16   Ct Angio Chest Pe W/cm &/or Wo Cm  Result Date: 02/12/2019 CLINICAL DATA:  Shortness of breath with nausea and anxiety. Clinical concern for pulmonary embolism. EXAM: CT ANGIOGRAPHY CHEST WITH CONTRAST TECHNIQUE: Multidetector CT imaging of the chest was performed using the standard protocol during bolus administration of intravenous contrast. Multiplanar CT image reconstructions and MIPs were obtained to evaluate the vascular anatomy. CONTRAST:  162mL OMNIPAQUE  IOHEXOL 350 MG/ML SOLN COMPARISON:  Cardiac CT 02/23/2018. FINDINGS: Cardiovascular: The pulmonary arteries are well opacified with contrast to the level of the subsegmental branches. There is no evidence of acute pulmonary embolism. Minimal coronary artery atherosclerosis. No significant systemic arterial abnormalities are identified. The heart size is normal. There is no pericardial effusion. Mediastinum/Nodes: There are no enlarged mediastinal, hilar or axillary lymph nodes. The thyroid gland, trachea and esophagus demonstrate no significant findings. Lungs/Pleura: There is no pleural effusion or pneumothorax. Patchy dependent ground-glass opacities in both lungs are similar to the previous study, likely atelectasis. There is a stable 4 mm subpleural ground-glass nodule in the right lower lobe on image 86/8, consistent with a benign finding. Upper abdomen: The liver demonstrates diffusely decreased density consistent with steatosis. No acute findings in the visualized upper abdomen. Musculoskeletal/Chest wall: There is no chest wall mass or suspicious osseous finding. Review of the MIP images confirms the above findings. IMPRESSION: 1. No evidence of acute pulmonary embolism or other acute chest process. 2. Stable mild dependent ground-glass opacities in both lungs, likely atelectasis. 3. Hepatic steatosis. Electronically Signed   By: Richardean Sale M.D.   On: 02/12/2019 15:56   Dg Chest Portable 1 View  Result Date: 02/12/2019 CLINICAL DATA:  Shortness of breath since yesterday, now feels numbness all over and cannot breathe, negative rapid COVID-19 test today; history asthma, hypertension EXAM: PORTABLE CHEST 1 VIEW COMPARISON:  Portable exam at 1401 hours compared to 12/30/2017 FINDINGS: Normal heart size, mediastinal contours, and pulmonary vascularity. Lungs clear. No pulmonary infiltrate, pleural effusion or pneumothorax. Minimal central peribronchial thickening. Bones unremarkable. IMPRESSION: Minimal  peribronchial thickening which could reflect asthma or bronchitis. No acute infiltrate. Electronically Signed   By: Lavonia Dana M.D.   On: 02/12/2019 14:23     MDM  Plan: F/u on CTA PE study.  Low suspicion for fluid overload  CTA PE study does not show evidence of acute PE or other acute chest process.  It does show stable groundglass opacities in both lungs along with hepatic steatosis which appears unchanged from previous.  She had a negative rapid COVID-19 test earlier today along with a second negative one while here.  Given that the groundglass opacities are consistent with her baseline I do not suspect that this is coronavirus related.    Given negative PE study additional labs including hepatic function panel, troponin, and BNP were obtained without evidence of acute abnormality or cause for symptoms.  Her nausea was treated and she was given IV fluids for suspected dehydration after which she felt much better, her shortness of breath resolved and she wished to go home.  She was able to p.o. challenge without difficulty.  She  is given a prescription for Zofran at home for use as needed.    Return precautions were discussed with patient who states their understanding.  At the time of discharge patient denied any unaddressed complaints or concerns.  Patient is agreeable for discharge home.        Lorin Glass, PA-C 02/12/19 2326    Valarie Merino, MD 02/16/19 [phone]

## 2019-02-12 NOTE — ED Notes (Signed)
Pt was verbalized discharge instructions. Pt had no further questions at this time. NAD. 

## 2019-02-12 NOTE — ED Notes (Signed)
Date and time results received: 02/12/19 1:40 PM  (use smartphrase ".now" to insert current time)  Test: Vbg Critical Value: PCo2 19.5 PO2 <31  Name of Provider Notified: Donna Christen PA  Orders Received? Or Actions Taken?: Orders Received - See Orders for details

## 2019-02-12 NOTE — ED Notes (Signed)
Pt very anxious upon assessment. Pt able to slow breathing when directed. Pt states "Can I get Oxygen, I need oxygen" Pt informed that her Oxygen is 100% on RA. Pt denies pain. Pt reports that this has been ongoing since yesterday early. Pt continuously pulling mask off stating she cannot breath. Pt instructed to leave mask on for the safety of those in the room.

## 2019-02-12 NOTE — ED Triage Notes (Signed)
Received call from PCP office. Per provider, pt is SHOB and tachypneic, approx 30 respirs per min. Pt has also had watery diarrhea and has had difficulty keeping down water. Pt has hx of COPD. Pt thinks that she is having a panic attack. PCP is concerned for pulmonary embolism.

## 2019-02-12 NOTE — ED Provider Notes (Signed)
[facility] DEPT Provider Note   CSN: [id number] Arrival date & time: 02/12/19  1220     History   Chief Complaint Chief Complaint  Patient presents with  . Shortness of Breath  . Numbness    HPI Nicole Phelps is a 58 y.o. female with PMH significant for HLD, HTN, anxiety, and asthma presents to the ED with a 2-day history of shortness of breath, nausea, nonbloody emesis, and diarrhea.  She also endorses "unable to keep anything down" as well as tingling and weakness diffusely.  She reports that she was scheduled to have a right knee replacement and had been isolating and taking precautions leading up to her scheduled surgery.  She reports having a rapid COVID-19 screening today for preop clearance which was negative.  Patient attempted to relieve her symptoms of shortness of breath with albuterol, but with no effect.  Patient evidently had requested benzodiazepine from her primary care provider, but she would not prescribe her in lieu of her current opioid use.  She is receiving buspirone by another provider for her anxiety.  PCP requested that she present to the ED to receive work-up for pulmonary embolism.  She denies any chest pain, palpitations, abdominal discomfort, dizziness, fever or chills, or recent illness.      HPI  Past Medical History:  Diagnosis Date  . Allergy   . Arthritis   . Asthma   . Benign hematuria 2004  . Depression   . Hyperlipidemia   . Hypertension   . TMJ (temporomandibular joint syndrome)   . Vitamin D deficiency     Patient Active Problem List   Diagnosis Date Noted  . Bruises easily 03/06/2018  . Mild CAD 03/06/2018  . Chronic obstructive pulmonary disease ([place]) 12/25/2017  . Obesity 09/16/2014  . Smoker 09/16/2014  . Estrogen deficiency 09/16/2014  . Asthma 09/16/2014  . Benign hematuria 09/16/2014  . B12 deficiency 09/16/2014  . Hypertension   . Hyperlipidemia   . Depression   . Vitamin D deficiency      Past Surgical History:  Procedure Laterality Date  . ABDOMINAL HYSTERECTOMY    . ANKLE SURGERY Left 2017  . BREAST SURGERY Left    lumpectomy benign  . KNEE ARTHROSCOPY W/ DEBRIDEMENT Right 11/2017  . TUBAL LIGATION       OB History   No obstetric history on file.      Home Medications    Prior to Admission medications   Medication Sig Start Date End Date Taking? Authorizing Provider  albuterol (VENTOLIN HFA) 108 (90 Base) MCG/ACT inhaler USE 2 PUFFS EVERY 6 HOURS  AS NEEDED FOR SHORTNESS OF  BREATH 12/02/18   Vicie Mutters, PA-C  azelastine (ASTELIN) 0.1 % nasal spray Place 2 sprays into both nostrils 2 (two) times daily. Use in each nostril as directed 02/23/16 02/10/19  Rolene Course, PA-C  busPIRone (BUSPAR) 15 MG tablet Take 1 tablet (15 mg total) by mouth 3 (three) times daily. 02/12/19   Vicie Mutters, PA-C  cholecalciferol (VITAMIN D) 1000 UNITS tablet Take 2,000 Units by mouth daily.     [provider]  diltiazem (CARDIZEM CD) 120 MG 24 hr capsule TAKE 1 CAPSULE BY MOUTH  DAILY 12/28/18   Vicie Mutters, PA-C  famotidine (PEPCID) 20 MG tablet Take 1 tablet (20 mg total) by mouth 2 (two) times daily. 01/06/19   Thornton Park, MD  fluticasone (FLONASE) 50 MCG/ACT nasal spray Place 1 spray into both nostrils daily. 02/23/16   Alfredo Martinez,  Loma Sousa, PA-C  Fluticasone-Salmeterol (ADVAIR DISKUS) 100-50 MCG/DOSE AEPB Use 1 Inhalation 2 x /day 10/18/18   Unk Pinto, MD  meloxicam (MOBIC) 15 MG tablet TAKE 1 TABLET BY MOUTH  DAILY AS NEEDED FOR PAIN 12/28/18   Vicie Mutters, PA-C  metoprolol tartrate (LOPRESSOR) 50 MG tablet Take 1 tablet (50 mg total) by mouth once for 1 dose. 01/05/18 02/10/19  Richardo Priest, MD  Multiple Vitamin (MULTIVITAMIN) capsule Take 1 capsule by mouth daily. 03/06/18   Richardo Priest, MD  omeprazole (PRILOSEC) 40 MG capsule Take 1 capsule (40 mg total) by mouth daily. 12/31/18 12/31/19  Vicie Mutters, PA-C  rosuvastatin (CRESTOR) 10  MG tablet TAKE 1 TABLET BY MOUTH EVERY DAY 05/04/18   Richardo Priest, MD  topiramate (TOPAMAX) 50 MG tablet Take 1 tablet (50 mg total) by mouth 2 (two) times daily. 01/21/19 01/21/20  Vicie Mutters, PA-C    Family History Family History  Problem Relation Age of Onset  . Cancer Mother        colon  . Arthritis Mother   . Hypertension Father   . Stroke Father   . Breast cancer Sister     Social History Social History   Tobacco Use  . Smoking status: Current Every Day Smoker    Packs/day: 0.50    Years: 25.00    Pack years: 12.50  . Smokeless tobacco: Never Used  Substance Use Topics  . Alcohol use: Yes    Comment: rare  . Drug use: Yes    Types: Marijuana     Allergies   Other, Azithromycin, Celexa [citalopram hydrobromide], Trintellix [vortioxetine], Vicodin [hydrocodone-acetaminophen], and Wellbutrin [bupropion]   Review of Systems Review of Systems  All other systems reviewed and are negative.    Physical Exam Updated Vital Signs BP (!) 159/74   Pulse 74   Temp 98 F (36.7 C) (Oral)   Resp 19   Ht 5\' 6"  (1.676 m)   Wt 87.1 kg   SpO2 100%   BMI 30.99 kg/m   Physical Exam Vitals signs and nursing note reviewed. Exam conducted with a chaperone present.  Constitutional:      Appearance: Normal appearance.     Comments: Breathing heavy.  HENT:     Head: Normocephalic and atraumatic.     Nose: Nose normal.     Mouth/Throat:     Pharynx: Oropharynx is clear.  Eyes:     General: No scleral icterus.    Conjunctiva/sclera: Conjunctivae normal.  Neck:     Musculoskeletal: Normal range of motion.  Cardiovascular:     Rate and Rhythm: Normal rate and regular rhythm.     Pulses: Normal pulses.     Heart sounds: Normal heart sounds.  Pulmonary:     Comments: Increased work of breathing.  Oxygenating well on room air.  Mild accessory muscle use.  Breath sounds intact bilaterally, no abnormal sounds auscultated.  Abdominal:     General: Abdomen is flat.  There is no distension.     Palpations: Abdomen is soft.     Tenderness: There is no abdominal tenderness. There is no guarding.  Skin:    General: Skin is dry.     Capillary Refill: Capillary refill takes less than 2 seconds.  Neurological:     Mental Status: She is alert and oriented to person, place, and time.     GCS: GCS eye subscore is 4. GCS verbal subscore is 5. GCS motor subscore is 6.  Psychiatric:  Mood and Affect: Mood normal.        Behavior: Behavior normal.        Thought Content: Thought content normal.     Comments: Appears anxious.      ED Treatments / Results  Labs (all labs ordered are listed, but only abnormal results are displayed) Labs Reviewed  BASIC METABOLIC PANEL - Abnormal; Notable for the following components:      Result Value   Chloride 113 (*)    CO2 16 (*)    Glucose, Bld 110 (*)    All other components within normal limits  BLOOD GAS, VENOUS - Abnormal; Notable for the following components:   pH, Ven 7.559 (*)    pCO2, Ven 19.5 (*)    pO2, Ven <31.0 (*)    Bicarbonate 17.4 (*)    Acid-base deficit 2.4 (*)    All other components within normal limits  SARS CORONAVIRUS 2 (TAT 6-24 HRS)  CBC WITH DIFFERENTIAL/PLATELET    EKG EKG Interpretation  Date/Time:  Friday February 12 2019 12:31:55 EST Ventricular Rate:  83 PR Interval:    QRS Duration: 77 QT Interval:  349 QTC Calculation: 410 R Axis:   8 Text Interpretation: Sinus rhythm Low voltage, precordial leads Borderline repolarization abnormality Confirmed by Pattricia Boss [phone]) on 02/12/2019 3:04:54 PM   Radiology Ct Head Wo Contrast  Result Date: 02/12/2019 CLINICAL DATA:  Paresthesias EXAM: CT HEAD WITHOUT CONTRAST TECHNIQUE: Contiguous axial images were obtained from the base of the skull through the vertex without intravenous contrast. COMPARISON:  None. FINDINGS: Brain: There is no acute intracranial hemorrhage, mass-effect, or edema. Gray-white differentiation is  preserved. There is no extra-axial fluid collection. Ventricles and sulci are within normal limits in size and configuration. Vascular: No hyperdense vessel or unexpected calcification. Skull: Calvarium is unremarkable. Sinuses/Orbits: No acute finding. Other: None. IMPRESSION: No acute intracranial abnormality. Electronically Signed   By: Macy Mis M.D.   On: 02/12/2019 14:16   Dg Chest Portable 1 View  Result Date: 02/12/2019 CLINICAL DATA:  Shortness of breath since yesterday, now feels numbness all over and cannot breathe, negative rapid COVID-19 test today; history asthma, hypertension EXAM: PORTABLE CHEST 1 VIEW COMPARISON:  Portable exam at 1401 hours compared to 12/30/2017 FINDINGS: Normal heart size, mediastinal contours, and pulmonary vascularity. Lungs clear. No pulmonary infiltrate, pleural effusion or pneumothorax. Minimal central peribronchial thickening. Bones unremarkable. IMPRESSION: Minimal peribronchial thickening which could reflect asthma or bronchitis. No acute infiltrate. Electronically Signed   By: Lavonia Dana M.D.   On: 02/12/2019 14:23    Procedures Procedures (including critical care time)  Medications Ordered in ED Medications  iohexol (OMNIPAQUE) 350 MG/ML injection 100 mL (100 mLs Intravenous Contrast Given 02/12/19 1524)     Initial Impression / Assessment and Plan / ED Course  I have reviewed the triage vital signs and the nursing notes.  Pertinent labs & imaging results that were available during my care of the patient were reviewed by me and considered in my medical decision making (see chart for details).        Patient was breathing particularly heavy on initial examination.  She describes generalized weakness and tingling throughout.  Suspect possible respiratory alkalosis.  Obtained a VBG which demonstrates critical PCO2 of 19.5 as well as a critical PO2 less than 31 and alkalotic to 7.56.  CBC demonstrates no leukocytosis concerning for infection or  anemia that might explain her shortness of breath symptoms.  BMP demonstrates no electrolyte normalities or impaired  renal function.  Bicarbonate is 16.  Patient is oxygenating 100% on RA and is not tachycardic, but she is demonstrating increased work of breathing as well as intermittent tachypnea.  Patient had negative rapid COVID-19 testing this morning, obtained 6 to 24-hour testing for further confirmation.  Patient used albuterol with no effect and there is no significant wheezing auscultated on my exam.  Will obtain CT angio chest PE study to evaluate for pulmonary embolism given patient's acute onset tachypnea and increased work of breathing.  At shift change care was transferred to Wyn Quaker, PA-C who will follow pending studies, re-evaluate, and determine disposition.      Final Clinical Impressions(s) / ED Diagnoses   Final diagnoses:  Shortness of breath    ED Discharge Orders    None       Corena Herter, PA-C 02/12/19 1529    Pattricia Boss, MD 03/08/19 1534

## 2019-02-12 NOTE — ED Notes (Signed)
Husband, Nicole Phelps, wants an update on his wife [phone].

## 2019-02-12 NOTE — ED Notes (Signed)
Patient was able to tolerate drinking water and eating saltine crackers.

## 2019-02-15 ENCOUNTER — Encounter: Payer: Self-pay | Admitting: Adult Health

## 2019-02-15 DIAGNOSIS — F411 Generalized anxiety disorder: Secondary | ICD-10-CM | POA: Insufficient documentation

## 2019-02-25 ENCOUNTER — Other Ambulatory Visit: Payer: Self-pay | Admitting: Adult Health

## 2019-03-20 ENCOUNTER — Other Ambulatory Visit: Payer: Self-pay | Admitting: Physician Assistant

## 2019-04-12 ENCOUNTER — Other Ambulatory Visit: Payer: Self-pay | Admitting: Cardiology

## 2019-04-12 NOTE — Progress Notes (Signed)
Changed insurance to [facility] and wants all her meds to go to [facility]- pharmacy removed  THIS ENCOUNTER IS A VIRTUAL/TELEPHONE VISIT DUE TO COVID-19 - PATIENT WAS NOT SEEN IN THE OFFICE.  PATIENT HAS CONSENTED TO VIRTUAL VISIT / TELEMEDICINE VISIT  This provider placed a call to Nicole Phelps using telephone, her appointment was changed to a virtual office visit to reduce the risk of exposure to the COVID-19 virus and to help Nicole Phelps remain healthy and safe. The virtual visit will also provide continuity of care. She verbalizes understanding.    Assessment and Plan: Diagnoses and all orders for this visit:  Chronic obstructive pulmonary disease, unspecified COPD type ([place]) Continue advair/albuterol  COPD exacerbation ([place]) -     doxycycline (VIBRAMYCIN) 100 MG capsule; Take 1 capsule twice daily with food -     benzonatate (TESSALON) 200 MG capsule; Take 1 capsule (200 mg total) by mouth 3 (three) times daily as needed for cough (Max: 600mg  per day). Currently mild symptoms, will get tested for COVID and will do self isolation at home Suggested symptomatic OTC remedies. Nasal saline spray for congestion. Nasal steroids, allergy pill, staying hydrated Follow up as needed via mychart or text. Please go to the ER or call 911 if symptoms become worse.    Discussed med's effects and SE's. Screening labs and tests as requested with regular follow-up as recommended. Future Appointments  Date Time Provider [place]  04/13/2019  9:30 AM Nicole Mutters, PA-C [facility] None  01/10/2020  9:00 AM Nicole Mutters, PA-C [facility] None    HPI 59 y.o. female s/p right TKA December 9th calling with cough x 7 days.  She is still not driving, she is taking pain medications and muscle relaxer.  Then last weeks she had sore throat, chest congestion, sinus rhinorrhea, no fever, no chills, some back pain.  No known exposure other than going to PT.  No SOB, CP, tachycardia.   She is going to  have knee surgery with Dr. Berenice Primas.  Had panic attack Dec 04, was seen in ED, PE was ruled out.   Current Medications:    Current Outpatient Medications (Cardiovascular):  .  diltiazem (CARDIZEM CD) 120 MG 24 hr capsule, TAKE 1 CAPSULE BY MOUTH  DAILY (Patient taking differently: Take 120 mg by mouth daily. ) .  metoprolol tartrate (LOPRESSOR) 50 MG tablet, Take 1 tablet (50 mg total) by mouth once for 1 dose. .  rosuvastatin (CRESTOR) 10 MG tablet, TAKE 1 TABLET BY MOUTH  DAILY  Current Outpatient Medications (Respiratory):  .  albuterol (VENTOLIN HFA) 108 (90 Base) MCG/ACT inhaler, USE 2 PUFFS EVERY 6 HOURS  AS NEEDED FOR SHORTNESS OF  BREATH (Patient taking differently: Inhale 2 puffs into the lungs every 6 (six) hours as needed for wheezing or shortness of breath. ) .  azelastine (ASTELIN) 0.1 % nasal spray, Place 2 sprays into both nostrils 2 (two) times daily. Use in each nostril as directed .  fluticasone (FLONASE) 50 MCG/ACT nasal spray, Place 1 spray into both nostrils daily. (Patient taking differently: Place 1 spray into both nostrils daily as needed for allergies. ) .  Fluticasone-Salmeterol (ADVAIR DISKUS) 100-50 MCG/DOSE AEPB, Use 1 Inhalation 2 x /day (Patient taking differently: Inhale 1 puff into the lungs 2 (two) times daily. )  Current Outpatient Medications (Analgesics):  .  meloxicam (MOBIC) 15 MG tablet, TAKE 1 TABLET BY MOUTH  DAILY AS NEEDED FOR PAIN (Patient taking differently: Take 15 mg by mouth daily  as needed for pain. )   Current Outpatient Medications (Other):  .  busPIRone (BUSPAR) 15 MG tablet, Take 1 tablet 3 x /day for Chronic Anxiety .  cholecalciferol (VITAMIN D) 1000 UNITS tablet, Take 2,000 Units by mouth daily.  .  famotidine (PEPCID) 20 MG tablet, Take 1 tablet (20 mg total) by mouth 2 (two) times daily. .  Multiple Vitamin (MULTIVITAMIN) capsule, Take 1 capsule by mouth daily. Marland Kitchen  omeprazole (PRILOSEC) 40 MG capsule, Take 1 capsule (40 mg total) by  mouth daily. Marland Kitchen  topiramate (TOPAMAX) 50 MG tablet, Take 1 tablet (50 mg total) by mouth 2 (two) times daily.   Medical History:  Past Medical History:  Diagnosis Date  . Allergy   . Arthritis   . Asthma   . Benign hematuria 2004  . Depression   . Hyperlipidemia   . Hypertension   . TMJ (temporomandibular joint syndrome)   . Vitamin D deficiency    Allergies Allergies  Allergen Reactions  . Other Shortness Of Breath    Household bleach- "takes my breath away"  . Azithromycin Hives  . Celexa [Citalopram Hydrobromide]     Myalgias  . Trintellix [Vortioxetine] Other (See Comments)    Dysphoria   . Vicodin [Hydrocodone-Acetaminophen] Nausea And Vomiting  . Wellbutrin [Bupropion]     anxiety   Surgical History: reviewed and unchanged Family History: reviewed and unchanged Social History: reviewed and unchanged  Review of Systems  Constitutional: Positive for malaise/fatigue. Negative for chills, diaphoresis and fever.  HENT: Positive for congestion and sore throat.   Eyes: Negative.   Respiratory: Positive for cough, sputum production and wheezing. Negative for shortness of breath.   Cardiovascular: Negative.  Negative for chest pain, claudication and leg swelling.  Gastrointestinal: Negative.   Genitourinary: Negative.    Physical Exam: Estimated body mass index is 30.99 kg/m as calculated from the following:   Height as of 02/12/19: 5\' 6"  (1.676 m).   Weight as of 02/12/19: 192 lb (87.1 kg). There were no vitals taken for this visit. General Appearance:Well sounding, in no apparent distress.  ENT/Mouth: No hoarseness, No cough for duration of visit.  Respiratory: completing full sentences without distress, without audible wheeze Neuro: Awake and oriented X 3,  Psych:  Insight and Judgment appropriate.     Nicole Phelps 1:01 PM

## 2019-04-13 ENCOUNTER — Other Ambulatory Visit: Payer: Self-pay

## 2019-04-13 ENCOUNTER — Ambulatory Visit: Payer: BC Managed Care – PPO | Admitting: Physician Assistant

## 2019-04-13 DIAGNOSIS — J441 Chronic obstructive pulmonary disease with (acute) exacerbation: Secondary | ICD-10-CM | POA: Diagnosis not present

## 2019-04-13 DIAGNOSIS — J449 Chronic obstructive pulmonary disease, unspecified: Secondary | ICD-10-CM

## 2019-04-13 MED ORDER — BENZONATATE 200 MG PO CAPS: 200.0000 mg | ORAL_CAPSULE | Freq: Three times a day (TID) | ORAL | 0 refills | Status: DC | PRN

## 2019-04-13 MED ORDER — DOXYCYCLINE HYCLATE 100 MG PO CAPS: ORAL_CAPSULE | ORAL | 0 refills | Status: DC

## 2019-04-14 DIAGNOSIS — Z20828 Contact with and (suspected) exposure to other viral communicable diseases: Secondary | ICD-10-CM | POA: Diagnosis not present

## 2019-04-20 DIAGNOSIS — Z9889 Other specified postprocedural states: Secondary | ICD-10-CM | POA: Diagnosis not present

## 2019-04-20 DIAGNOSIS — Z471 Aftercare following joint replacement surgery: Secondary | ICD-10-CM | POA: Diagnosis not present

## 2019-04-20 DIAGNOSIS — M25661 Stiffness of right knee, not elsewhere classified: Secondary | ICD-10-CM | POA: Diagnosis not present

## 2019-04-30 ENCOUNTER — Other Ambulatory Visit: Payer: Self-pay | Admitting: Physician Assistant

## 2019-04-30 DIAGNOSIS — R0602 Shortness of breath: Secondary | ICD-10-CM

## 2019-04-30 DIAGNOSIS — J4520 Mild intermittent asthma, uncomplicated: Secondary | ICD-10-CM

## 2019-04-30 DIAGNOSIS — R1011 Right upper quadrant pain: Secondary | ICD-10-CM

## 2019-04-30 DIAGNOSIS — J45909 Unspecified asthma, uncomplicated: Secondary | ICD-10-CM

## 2019-05-03 MED ORDER — BUSPIRONE HCL 15 MG PO TABS: ORAL_TABLET | ORAL | 0 refills | Status: DC

## 2019-05-03 MED ORDER — ESCITALOPRAM OXALATE 10 MG PO TABS: ORAL_TABLET | ORAL | 0 refills | Status: DC

## 2019-05-03 MED ORDER — DILTIAZEM HCL ER COATED BEADS 120 MG PO CP24: ORAL_CAPSULE | ORAL | 0 refills | Status: DC

## 2019-05-03 MED ORDER — OMEPRAZOLE 40 MG PO CPDR: DELAYED_RELEASE_CAPSULE | ORAL | 0 refills | Status: DC

## 2019-05-03 MED ORDER — FLUTICASONE-SALMETEROL 100-50 MCG/DOSE IN AEPB: 1.0000 | INHALATION_SPRAY | Freq: Two times a day (BID) | RESPIRATORY_TRACT | 1 refills | Status: DC

## 2019-05-03 MED ORDER — ALBUTEROL SULFATE HFA 108 (90 BASE) MCG/ACT IN AERS: INHALATION_SPRAY | RESPIRATORY_TRACT | 1 refills | Status: DC

## 2019-05-03 MED ORDER — ROSUVASTATIN CALCIUM 10 MG PO TABS: ORAL_TABLET | ORAL | 0 refills | Status: DC

## 2019-05-03 NOTE — Telephone Encounter (Signed)
Recent Visits Date Type Provider Dept  04/13/19 Office Visit Vicie Mutters, PA-C [place]  02/12/19 Office Visit Liane Comber, NP [place]  02/10/19 Office Visit Liane Comber, NP [facility]  01/07/19 Office Visit Vicie Mutters, PA-C [place]  12/02/18 Office Visit Vicie Mutters, PA-C [place]  07/01/18 Office Visit Vicie Mutters, PA-C [place]  05/04/18 Office Visit Vicie Mutters, PA-C [facility]  12/30/17 Office Visit Vicie Mutters, PA-C [place] recent visits within past 540 days with a meds authorizing provider and meeting all other requirements   Future Appointments No visits were found meeting these conditions.  Showing future appointments within next 150 days with a meds authorizing provider and meeting all other requirements

## 2019-05-17 MED ORDER — ESCITALOPRAM OXALATE 20 MG PO TABS: ORAL_TABLET | ORAL | 1 refills | Status: DC

## 2019-05-24 DIAGNOSIS — Z1231 Encounter for screening mammogram for malignant neoplasm of breast: Secondary | ICD-10-CM | POA: Diagnosis not present

## 2019-05-24 LAB — HM MAMMOGRAPHY

## 2019-06-10 DIAGNOSIS — M25532 Pain in left wrist: Secondary | ICD-10-CM | POA: Diagnosis not present

## 2019-06-10 DIAGNOSIS — Z09 Encounter for follow-up examination after completed treatment for conditions other than malignant neoplasm: Secondary | ICD-10-CM | POA: Diagnosis not present

## 2019-06-10 DIAGNOSIS — Z96651 Presence of right artificial knee joint: Secondary | ICD-10-CM | POA: Diagnosis not present

## 2019-06-11 ENCOUNTER — Other Ambulatory Visit: Payer: Self-pay | Admitting: Physician Assistant

## 2019-06-22 DIAGNOSIS — M25532 Pain in left wrist: Secondary | ICD-10-CM | POA: Diagnosis not present

## 2019-07-06 ENCOUNTER — Telehealth: Payer: Self-pay | Admitting: Physician Assistant

## 2019-07-06 NOTE — Telephone Encounter (Signed)
Called to request any DOS 04-22-19 TO present.  Advised agent, No new office visits.

## 2019-07-13 DIAGNOSIS — M25532 Pain in left wrist: Secondary | ICD-10-CM | POA: Diagnosis not present

## 2019-07-21 IMAGING — CR DG CHEST 2V
2 series · 2 of 2 positions shown · non-contrast
Comparison: 09/14/2013

CLINICAL DATA: Annual physical exam, smoker, shortness of breath

EXAM:
CHEST - 2 VIEW

[chest pa]
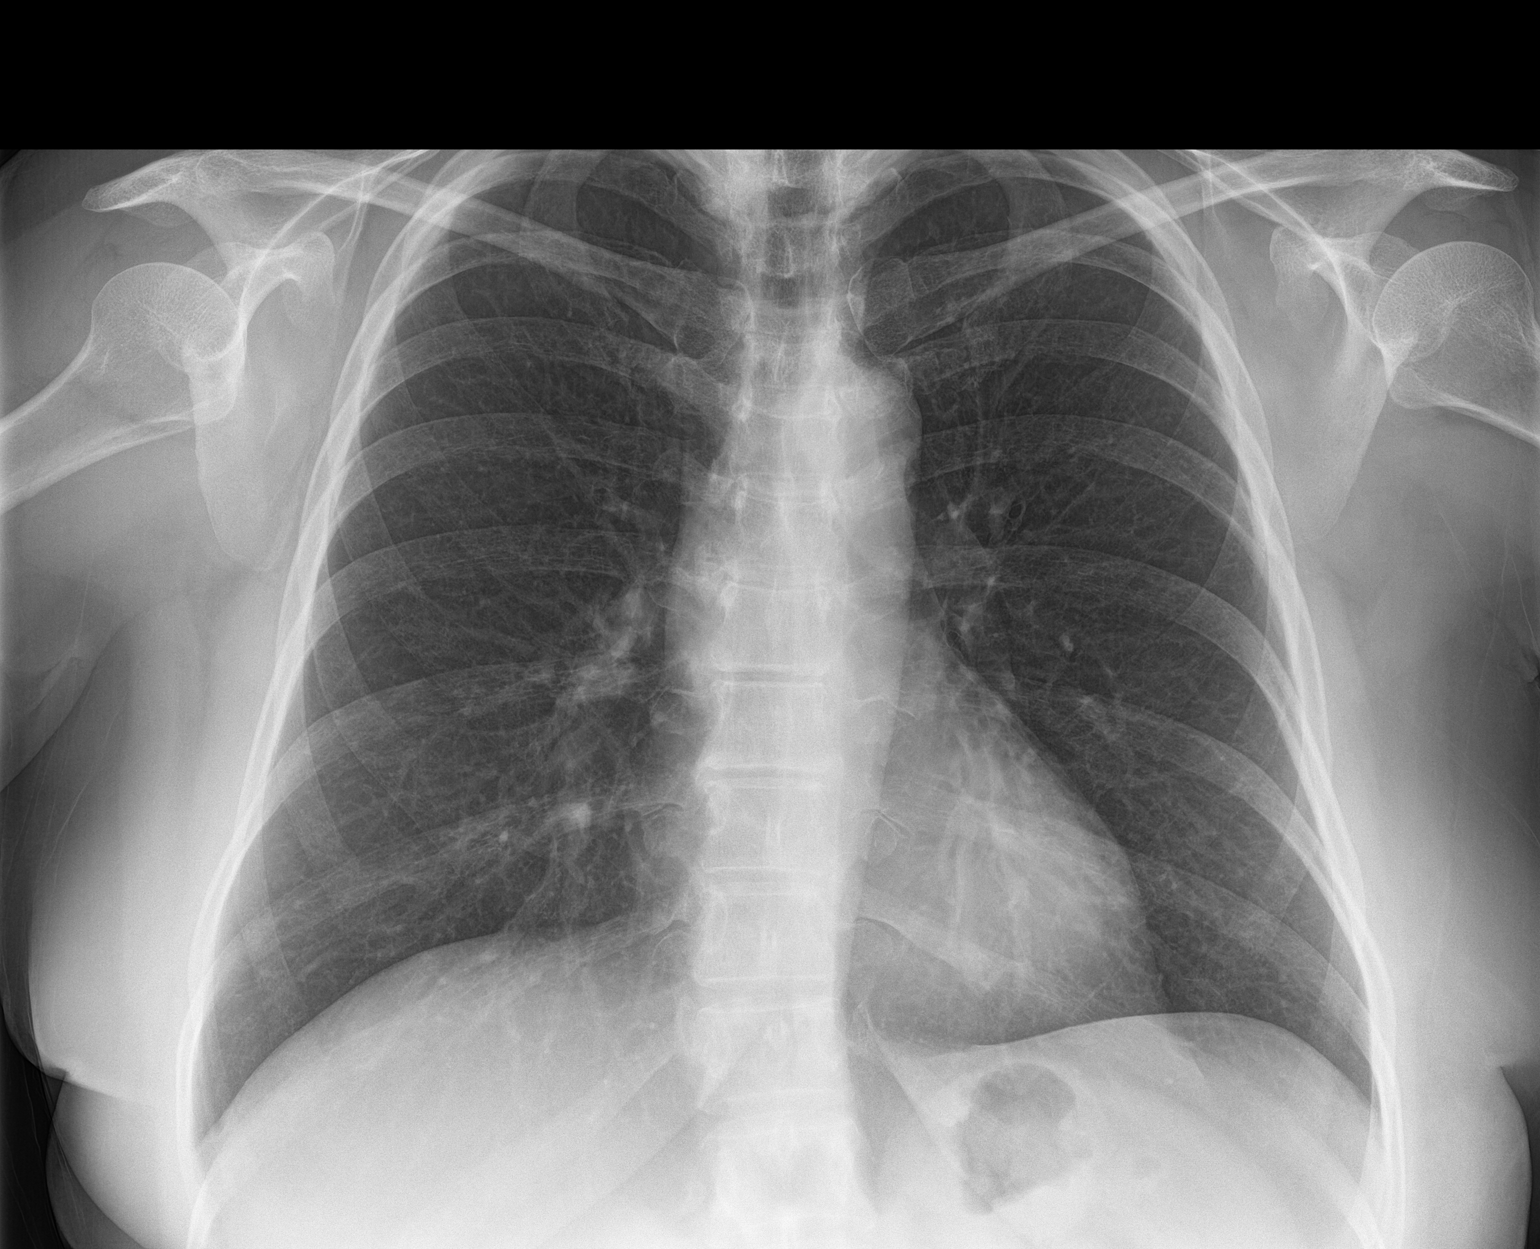

[chest lat]
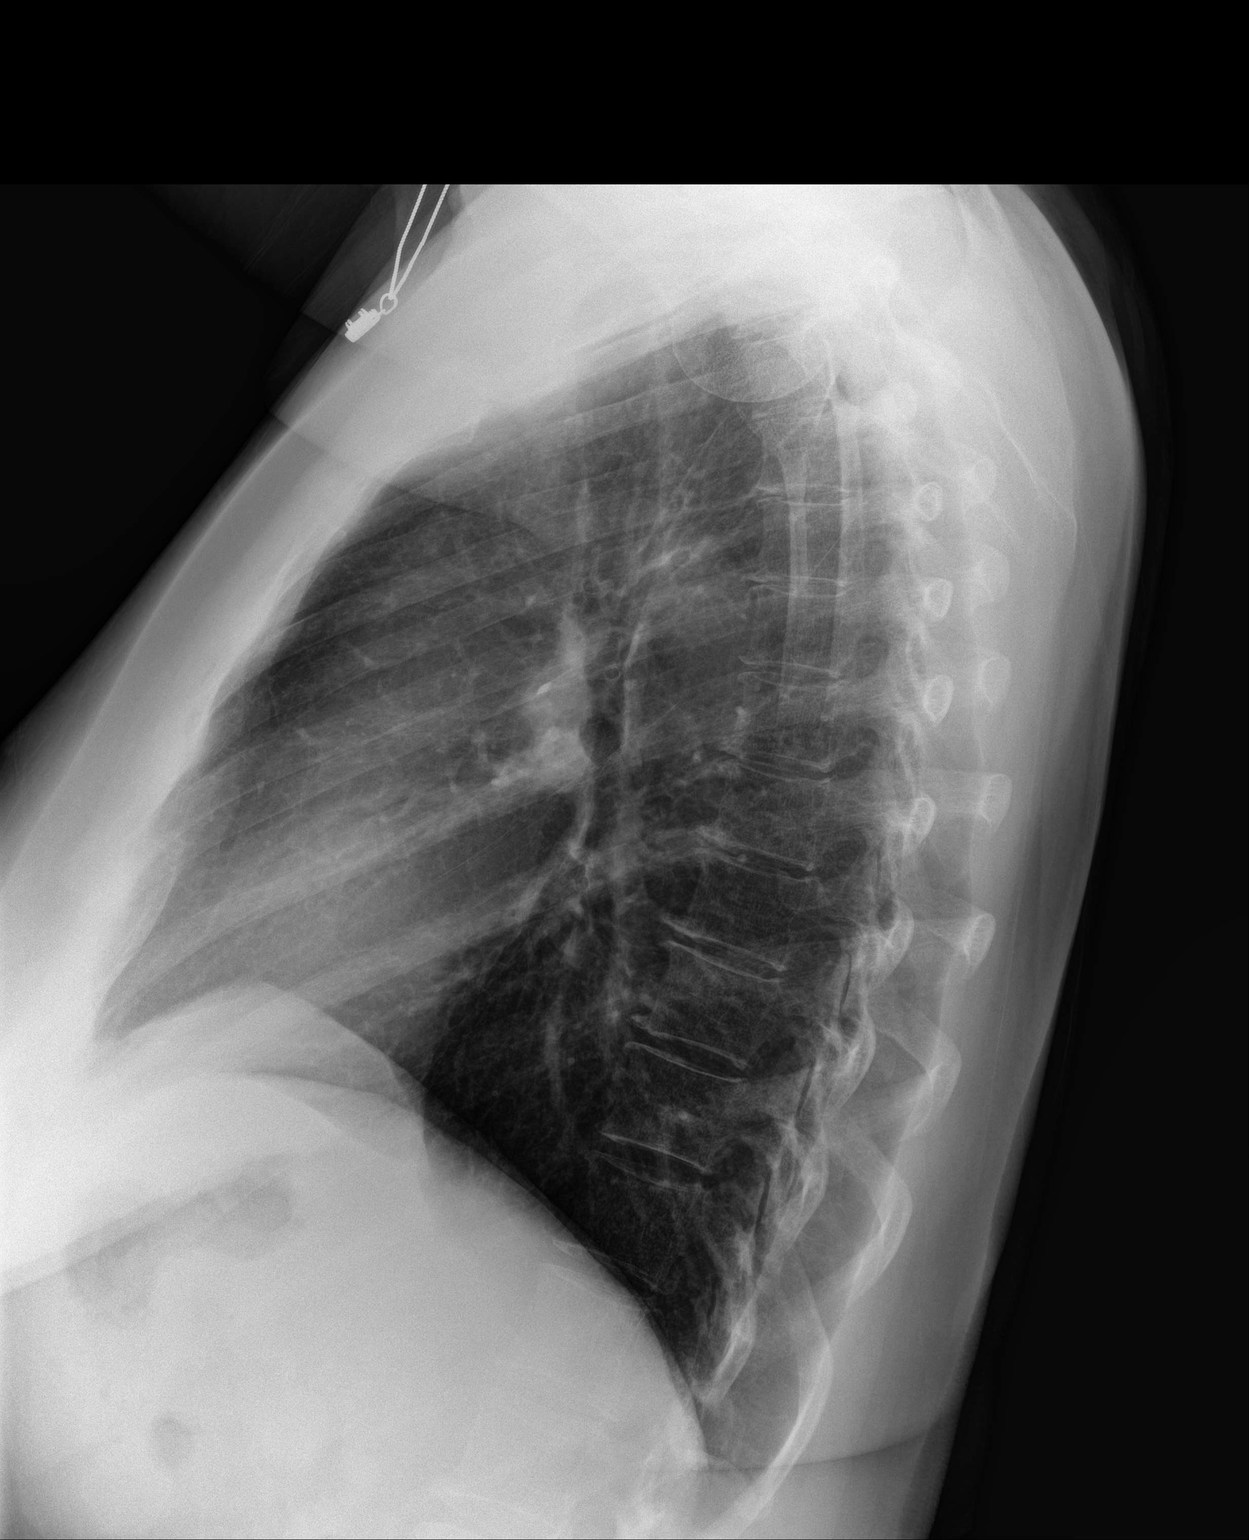

[2 of 2 positions shown; findings below may reference images not displayed]

FINDINGS: Normal heart size, mediastinal contours, and pulmonary vascularity.

Lungs clear.

No pulmonary infiltrate, pleural effusion, or pneumothorax.

No acute osseous findings.
IMPRESSION: No acute abnormalities.

## 2019-07-21 NOTE — Progress Notes (Signed)
3 MONTH FOLLOW UP Assessment and Plan:  Essential hypertension - continue medications, DASH diet, exercise and monitor at home. Call if greater than 130/80.   Hyperlipidemia, unspecified hyperlipidemia type -     Lipid panel check lipids decrease fatty foods increase activity.   Recurrent major depressive disorder, in partial remission ([place]) - continue medications, stress management techniques discussed, increase water, good sleep hygiene discussed, increase exercise, and increase veggies.   Smoker Smoking cessation-  instruction/counseling given, counseled patient on the dangers of tobacco use, advised patient to stop smoking, and reviewed strategies to maximize success, patient not ready to quit at this time.   Vitamin D deficiency -     Recently checked  Medication management -     Magnesium  Chronic obstructive pulmonary disease, unspecified COPD type ([place]) Advised to stop smoking - risks discussed. Declines medications.  CXR annually Currently stable with respiratory medications  Discussed med's effects and SE's. Screening labs and tests as requested with regular follow-up as recommended. Future Appointments  Date Time Provider [place]  01/10/2020  9:00 AM Vicie Mutters, PA-C [facility] None    HPI 59 y.o. female  presents for a 3 MONTH FOLLOW UP.   She has history of anxiety, she is on the buspar 15 mg TID, lexapro 20 mg daily.   BMI is Body mass index is 31.05 kg/m., she is working on diet and exercise. Wt Readings from Last 3 Encounters:  07/22/19 192 lb 6.4 oz (87.3 kg)  02/12/19 192 lb (87.1 kg)  02/10/19 192 lb (87.1 kg)   She is s/p right TKR Dec 9th 2020 with Dr. Berenice Primas. She is walking now.  Her blood pressure has been controlled at home, today their BP is BP: 116/70 She does workout.  She denies chest pain, shortness of breath, dizziness.  She had Ct coronary 02/2018 that showed mild disease, medically treated.   Has COPD/asthma and uses  Advair and albuterol.   She is on  claritin, nasal spray.  CXR 02/2019  She snores at night, has fatigue, frequent awakening/nocturia, snoring at night per husband- will refer for sleep study.  She is on cholesterol medication, started previously and denies myalgias. Her cholesterol is not at goal. The cholesterol last visit was:   Lab Results  Component Value Date   CHOL 127 02/10/2019   HDL 50 02/10/2019   LDLCALC 57 02/10/2019   TRIG 121 02/10/2019   CHOLHDL 2.5 02/10/2019   Last A1C in the office was:  Lab Results  Component Value Date   HGBA1C 5.3 02/10/2019   B12 was  Lab Results  Component Value Date   VITAMINB12 838 12/02/2018   Patient is on Vitamin D supplement, 5000IU   Lab Results  Component Value Date   VD25OH 27 (L) 12/02/2018    Current Medications:    Current Outpatient Medications (Cardiovascular):  .  diltiazem (CARDIZEM CD) 120 MG 24 hr capsule, 1 TABLET DAILY .  rosuvastatin (CRESTOR) 10 MG tablet, 1 TABLET DAILY FOR CHOLESTEROL  Current Outpatient Medications (Respiratory):  .  albuterol (VENTOLIN HFA) 108 (90 Base) MCG/ACT inhaler, USE 2 PUFFS EVERY 6 HOURS  AS NEEDED FOR SHORTNESS OF  BREATH .  fluticasone (FLONASE) 50 MCG/ACT nasal spray, Place 1 spray into both nostrils daily. (Patient taking differently: Place 1 spray into both nostrils daily as needed for allergies. ) .  Fluticasone-Salmeterol (ADVAIR DISKUS) 100-50 MCG/DOSE AEPB, Inhale 1 puff into the lungs 2 (two) times daily. Marland Kitchen  azelastine (ASTELIN) 0.1 %  nasal spray, Place 2 sprays into both nostrils 2 (two) times daily. Use in each nostril as directed    Current Outpatient Medications (Other):  .  busPIRone (BUSPAR) 15 MG tablet, TAKE 1 TABLET BY MOUTH 3 TIMES A DAY FOR CHRONIC ANXIETY .  cholecalciferol (VITAMIN D) 1000 UNITS tablet, Take 2,000 Units by mouth daily.  Marland Kitchen  escitalopram (LEXAPRO) 20 MG tablet, 1 TABLET DAILY FOR MOOD .  famotidine (PEPCID) 20 MG tablet, Take 1 tablet (20  mg total) by mouth 2 (two) times daily. .  Multiple Vitamin (MULTIVITAMIN) capsule, Take 1 capsule by mouth daily. Marland Kitchen  omeprazole (PRILOSEC) 40 MG capsule, TAKE 1 CAPSULE DAILY FOR GERD   Medical History:  Past Medical History:  Diagnosis Date  . Allergy   . Arthritis   . Asthma   . Benign hematuria 2004  . Depression   . Hyperlipidemia   . Hypertension   . TMJ (temporomandibular joint syndrome)   . Vitamin D deficiency    Allergies Allergies  Allergen Reactions  . Other Shortness Of Breath    Household bleach- "takes my breath away"  . Azithromycin Hives  . Celexa [Citalopram Hydrobromide]     Myalgias  . Trintellix [Vortioxetine] Other (See Comments)    Dysphoria   . Vicodin [Hydrocodone-Acetaminophen] Nausea And Vomiting  . Wellbutrin [Bupropion]     anxiety   SURGICAL HISTORY She  has a past surgical history that includes Abdominal hysterectomy; Tubal ligation; Breast surgery (Left); Knee arthroscopy w/ debridement (Right, 11/2017); and Ankle surgery (Left, 2017). FAMILY HISTORY Her family history includes Arthritis in her mother; Breast cancer in her sister; Cancer in her mother; Hypertension in her father; Stroke in her father. SOCIAL HISTORY She  reports that she has been smoking. She has a 12.50 pack-year smoking history. She has never used smokeless tobacco. She reports current alcohol use. She reports current drug use. Drug: Marijuana.  Review of Systems  Constitutional: Positive for malaise/fatigue. Negative for chills, diaphoresis, fever and weight loss.  HENT: Negative for congestion, ear discharge, ear pain, hearing loss, nosebleeds, sore throat and tinnitus.   Eyes: Negative.   Respiratory: Negative for cough, hemoptysis, sputum production, shortness of breath, wheezing and stridor.   Cardiovascular: Negative.   Gastrointestinal: Negative for abdominal pain, blood in stool, constipation, diarrhea, heartburn, melena, nausea and vomiting.  Genitourinary:  Negative for dysuria, flank pain, frequency, hematuria and urgency.  Musculoskeletal: Negative.   Skin: Negative.   Neurological: Negative.  Negative for weakness and headaches.  Psychiatric/Behavioral: Positive for depression. Negative for hallucinations, memory loss, substance abuse and suicidal ideas. The patient is not nervous/anxious and does not have insomnia (improved with gabapentin).    Physical Exam: Estimated body mass index is 31.05 kg/m as calculated from the following:   Height as of 02/12/19: 5\' 6"  (1.676 m).   Weight as of this encounter: 192 lb 6.4 oz (87.3 kg). BP 116/70   Pulse 69   Temp (!) 97.3 F (36.3 C)   Wt 192 lb 6.4 oz (87.3 kg)   SpO2 98%   BMI 31.05 kg/m  General Appearance: Well nourished, in no apparent distress. Eyes: PERRLA, EOMs, conjunctiva no swelling or erythema, normal fundi and vessels. Sinuses: No Frontal/maxillary tenderness ENT/Mouth: Ext aud canals clear, normal light reflex with TMs without erythema, bulging.  Good dentition. No erythema, swelling, or exudate on post pharynx. Tonsils not swollen or erythematous. Hearing normal. Crowded mouth.  Neck: Supple, thyroid normal. No bruits Respiratory: Respiratory effort normal, BS equal  bilaterally without rales, rhonchi, wheezing or stridor. Cardio: RRR without murmurs, rubs or gallops. Brisk peripheral pulses without edema.  Chest: symmetric, with normal excursions and percussion. Abdomen: Soft, +BS. RUQ pain, tender bilateral lower AB, no guarding, rebound, hernias, masses, or organomegaly. .  Lymphatics: Non tender without lymphadenopathy.  Musculoskeletal: Full ROM all peripheral extremities,5/5 strength, and normal gait.  Skin: Warm, dry without rashes, lesions, ecchymosis.  Neuro: Cranial nerves intact, reflexes equal bilaterally. Normal muscle tone, no cerebellar symptoms. Sensation intact.  Psych: Awake and oriented X 3, normal affect, Insight and Judgment appropriate.     Vicie Mutters 10:54 AM

## 2019-07-22 ENCOUNTER — Ambulatory Visit: Payer: BC Managed Care – PPO | Admitting: Physician Assistant

## 2019-07-22 ENCOUNTER — Other Ambulatory Visit: Payer: Self-pay

## 2019-07-22 ENCOUNTER — Encounter: Payer: Self-pay | Admitting: Physician Assistant

## 2019-07-22 VITALS — BP 116/70 | HR 69 | Temp 97.3°F | Wt 192.4 lb

## 2019-07-22 DIAGNOSIS — F17200 Nicotine dependence, unspecified, uncomplicated: Secondary | ICD-10-CM

## 2019-07-22 DIAGNOSIS — I10 Essential (primary) hypertension: Secondary | ICD-10-CM | POA: Diagnosis not present

## 2019-07-22 DIAGNOSIS — J449 Chronic obstructive pulmonary disease, unspecified: Secondary | ICD-10-CM

## 2019-07-22 DIAGNOSIS — G4700 Insomnia, unspecified: Secondary | ICD-10-CM

## 2019-07-22 DIAGNOSIS — Z79899 Other long term (current) drug therapy: Secondary | ICD-10-CM

## 2019-07-22 DIAGNOSIS — E559 Vitamin D deficiency, unspecified: Secondary | ICD-10-CM

## 2019-07-22 DIAGNOSIS — F3341 Major depressive disorder, recurrent, in partial remission: Secondary | ICD-10-CM | POA: Diagnosis not present

## 2019-07-22 DIAGNOSIS — R0683 Snoring: Secondary | ICD-10-CM

## 2019-07-22 DIAGNOSIS — R5383 Other fatigue: Secondary | ICD-10-CM

## 2019-07-22 DIAGNOSIS — E785 Hyperlipidemia, unspecified: Secondary | ICD-10-CM

## 2019-07-22 NOTE — Patient Instructions (Addendum)
General eating tips  What to Avoid . Avoid added sugars o Often added sugar can be found in processed foods such as many condiments, dry cereals, cakes, cookies, chips, crisps, crackers, candies, sweetened drinks, etc.  o Read labels and AVOID/DECREASE use of foods with the following in their ingredient list: Sugar, fructose, high fructose corn syrup, sucrose, glucose, maltose, dextrose, molasses, cane sugar, brown sugar, any type of syrup, agave nectar, etc.   . Avoid snacking in between meals- drink water or if you feel you need a snack, pick a high water content snack such as cucumbers, watermelon, or any veggie.  Marland Kitchen Avoid foods made with flour o If you are going to eat food made with flour, choose those made with whole-grains; and, minimize your consumption as much as is tolerable . Avoid processed foods o These foods are generally stocked in the middle of the grocery store.  o Focus on shopping on the perimeter of the grocery.  What to Include . Vegetables o GREEN LEAFY VEGETABLES: Kale, spinach, mustard greens, collard greens, cabbage, broccoli, etc. o OTHER: Asparagus, cauliflower, eggplant, carrots, peas, Brussel sprouts, tomatoes, bell peppers, zucchini, beets, cucumbers, etc. . Grains, seeds, and legumes o Beans: kidney beans, black eyed peas, garbanzo beans, black beans, pinto beans, etc. o Whole, unrefined grains: brown rice, barley, bulgur, oatmeal, etc. . Healthy fats  o Avoid highly processed fats such as vegetable oil o Examples of healthy fats: avocado, olives, virgin olive oil, dark chocolate (?72% Cocoa), nuts (peanuts, almonds, walnuts, cashews, pecans, etc.) o Please still do small amount of these healthy fats, they are dense in calories.  . Low - Moderate Intake of Animal Sources of Protein o Meat sources: chicken, [place], salmon, tuna. Limit to 4 ounces of meat at one time or the size of your palm. o Consider limiting dairy sources, but when choosing dairy focus on:  PLAIN [place] yogurt, cottage cheese, high-protein milk . Fruit o Choose berries   Counseling services   I suggest calling your insurance and finding out who is in your network and THEN calling those people or looking them up on google.   I'm a big fan of Cognitive Behavioral Therapy, look this up on You tube or check with the therapist you see if they are certified.  This form of therapy helps to teach you skills to better handle with current situation that are causing anxiety or depression.   There are some great apps too Check out [place], give thanks app.  Meditations apps are great like headspace.    Nuts are a healthy fat that can help you feel full HOWEVER they pack a lot of calories in a small amount. For weight loss, I suggest no more than 1 serving of nuts a day. A handful goes a long way. Here are some examples of the different types of nuts and the suggested serving a day.

## 2019-07-23 LAB — CBC WITH DIFFERENTIAL/PLATELET
Absolute Monocytes: 595 cells/uL (ref 200–950)
Basophils Absolute: 48 cells/uL (ref 0–200)
Basophils Relative: 0.5 %
Eosinophils Absolute: 125 cells/uL (ref 15–500)
Eosinophils Relative: 1.3 %
HCT: 42 % (ref 35.0–45.0)
Hemoglobin: 14 g/dL (ref 11.7–15.5)
Lymphs Abs: 3110 cells/uL (ref 850–3900)
MCH: 30.4 pg (ref 27.0–33.0)
MCHC: 33.3 g/dL (ref 32.0–36.0)
MCV: 91.3 fL (ref 80.0–100.0)
MPV: 10.5 fL (ref 7.5–12.5)
Monocytes Relative: 6.2 %
Neutro Abs: 5722 cells/uL (ref 1500–7800)
Neutrophils Relative %: 59.6 %
Platelets: 321 10*3/uL (ref 140–400)
RBC: 4.6 10*6/uL (ref 3.80–5.10)
RDW: 14.1 % (ref 11.0–15.0)
Total Lymphocyte: 32.4 %
WBC: 9.6 10*3/uL (ref 3.8–10.8)

## 2019-07-23 LAB — COMPLETE METABOLIC PANEL WITH GFR
AG Ratio: 2.3 (calc) (ref 1.0–2.5)
ALT: 20 U/L (ref 6–29)
AST: 16 U/L (ref 10–35)
Albumin: 4.3 g/dL (ref 3.6–5.1)
Alkaline phosphatase (APISO): 97 U/L (ref 37–153)
BUN: 16 mg/dL (ref 7–25)
CO2: 30 mmol/L (ref 20–32)
Calcium: 9.3 mg/dL (ref 8.6–10.4)
Chloride: 104 mmol/L (ref 98–110)
Creat: 0.93 mg/dL (ref 0.50–1.05)
GFR, Est African American: 79 mL/min/{1.73_m2} (ref >=60)
GFR, Est Non African American: 68 mL/min/{1.73_m2} (ref >=60)
Globulin: 1.9 g/dL (calc) (ref 1.9–3.7)
Glucose, Bld: 89 mg/dL (ref 65–99)
Potassium: 4.6 mmol/L (ref 3.5–5.3)
Sodium: 140 mmol/L (ref 135–146)
Total Bilirubin: 0.6 mg/dL (ref 0.2–1.2)
Total Protein: 6.2 g/dL (ref 6.1–8.1)

## 2019-07-23 LAB — LIPID PANEL
Cholesterol: 164 mg/dL (ref <200)
HDL: 72 mg/dL (ref >=50)
LDL Cholesterol (Calc): 67 mg/dL (calc)
Non-HDL Cholesterol (Calc): 92 mg/dL (calc) (ref <130)
Total CHOL/HDL Ratio: 2.3 (calc) (ref <5.0)
Triglycerides: 172 mg/dL — ABNORMAL HIGH (ref <150)

## 2019-07-23 LAB — VITAMIN D 25 HYDROXY (VIT D DEFICIENCY, FRACTURES): Vit D, 25-Hydroxy: 31 ng/mL (ref 30–100)

## 2019-07-23 LAB — MAGNESIUM: Magnesium: 2.2 mg/dL (ref 1.5–2.5)

## 2019-07-23 LAB — TSH: TSH: 2.5 mIU/L (ref 0.40–4.50)

## 2019-07-23 IMAGING — US US ABDOMEN COMPLETE
1 series · 13 of 25 positions shown · non-contrast
Comparison: Abdominal ultrasound 07/10/2015

CLINICAL DATA: Right upper quadrant abdominal pain for 2 years.

EXAM:
ABDOMEN ULTRASOUND COMPLETE

[Series 1: us abdomen complete · 0.20mm/px · 13 of 89 slices shown]
[im 1/89]
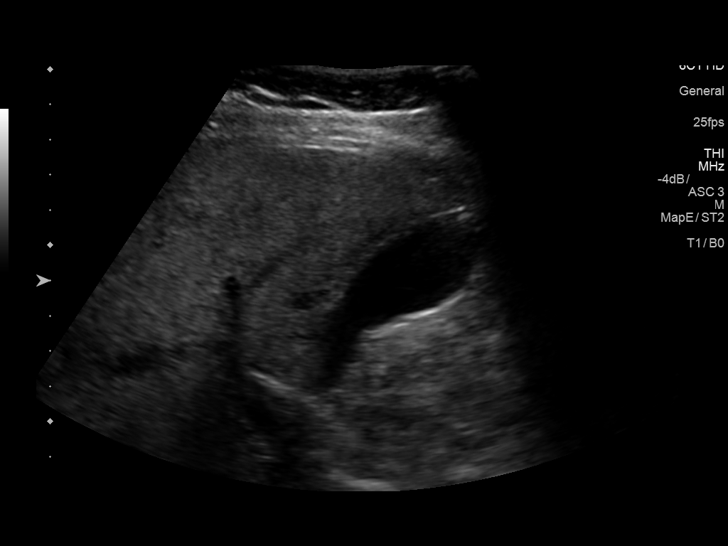
[im 8/89]
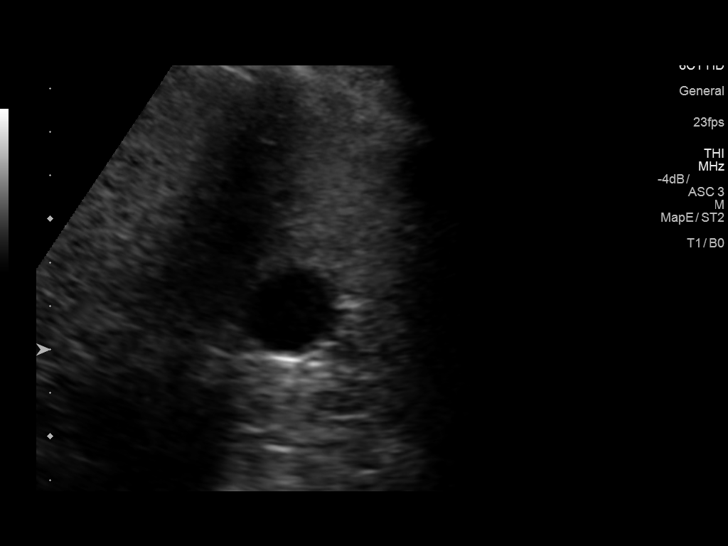
[im 15/89]
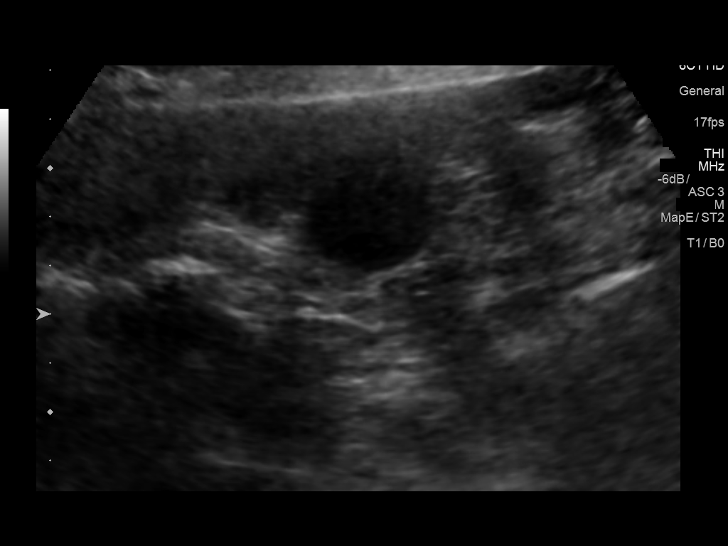
[im 23/89]
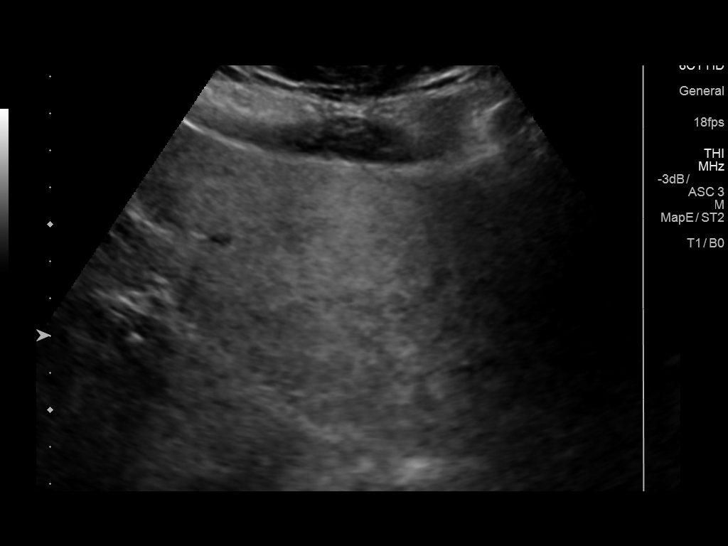
[im 30/89]
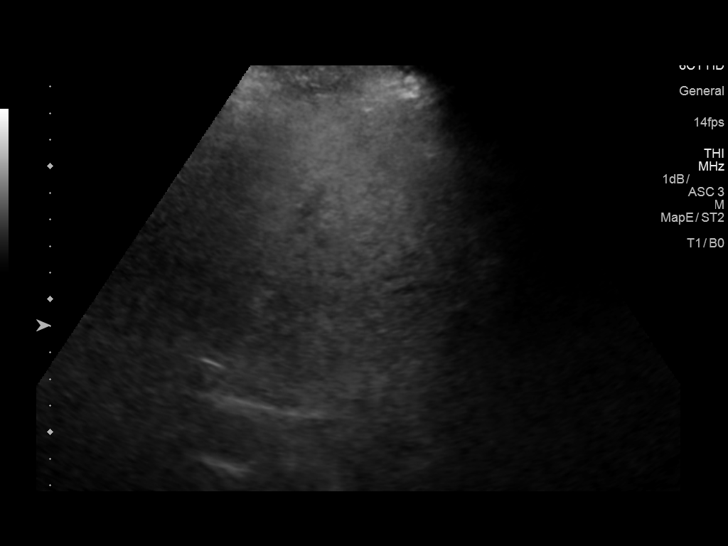
[im 37/89]
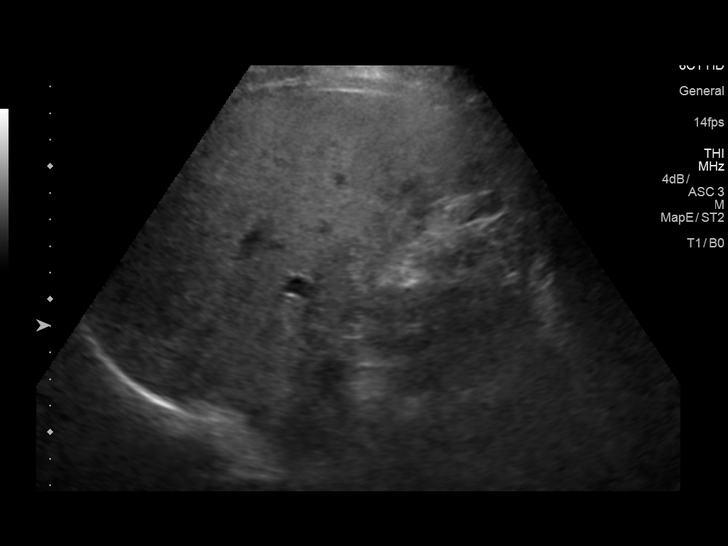
[im 45/89]
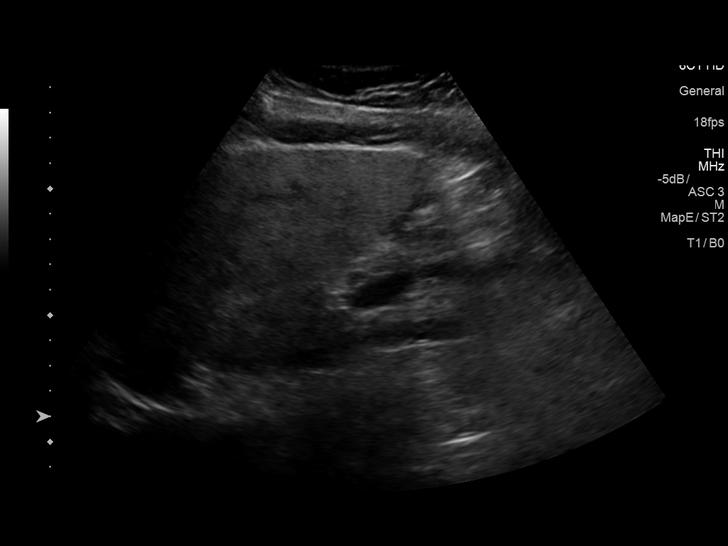
[im 52/89]
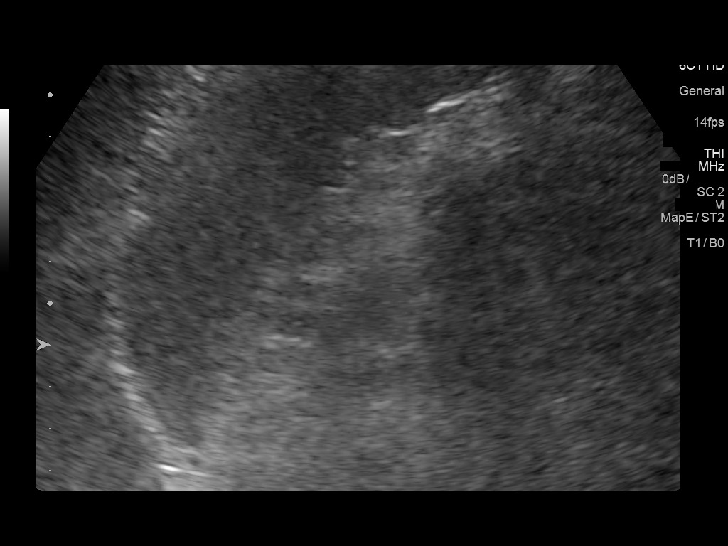
[im 59/89]
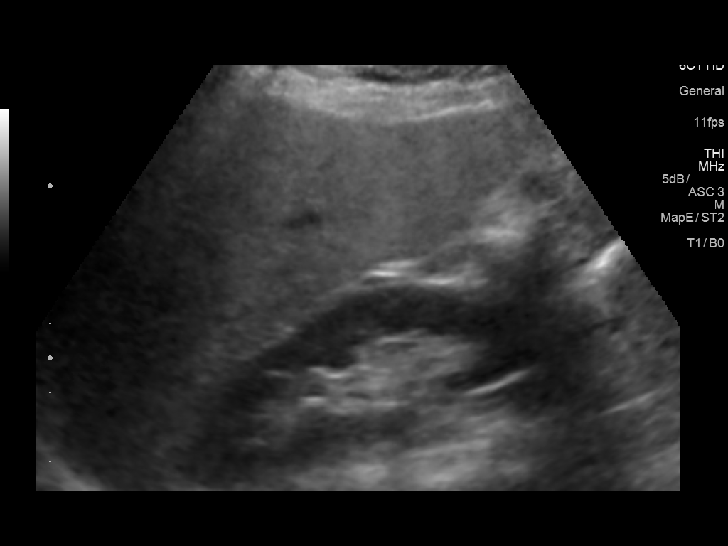
[im 67/89]
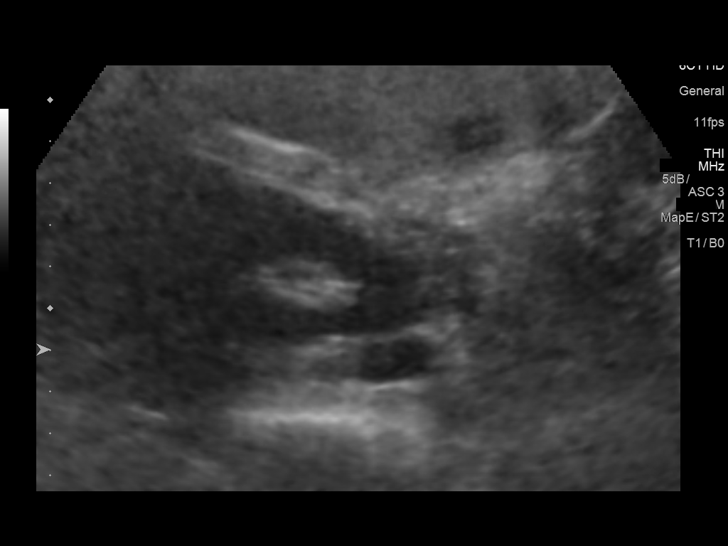
[im 74/89]
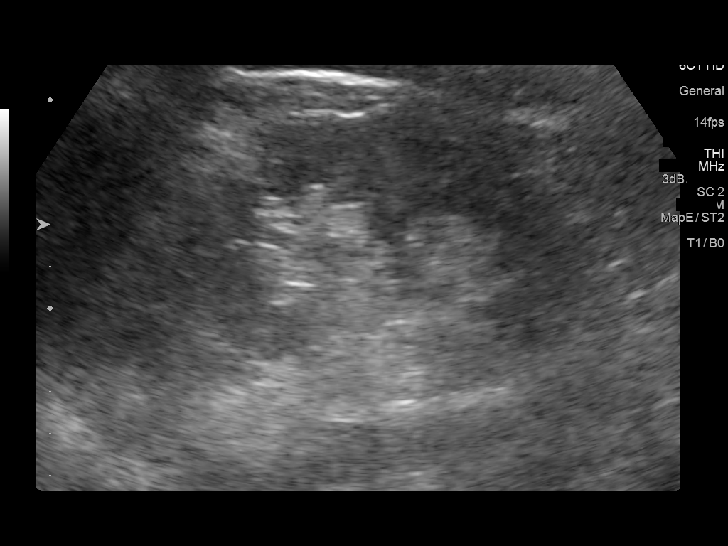
[im 81/89]
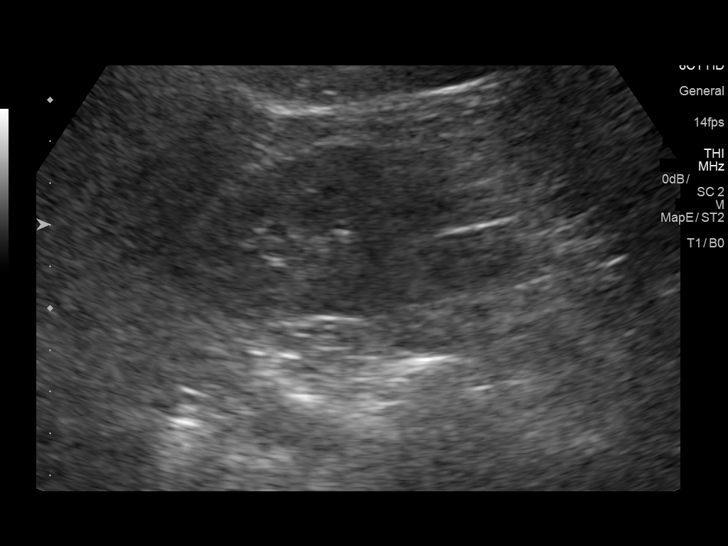
[im 89/89]
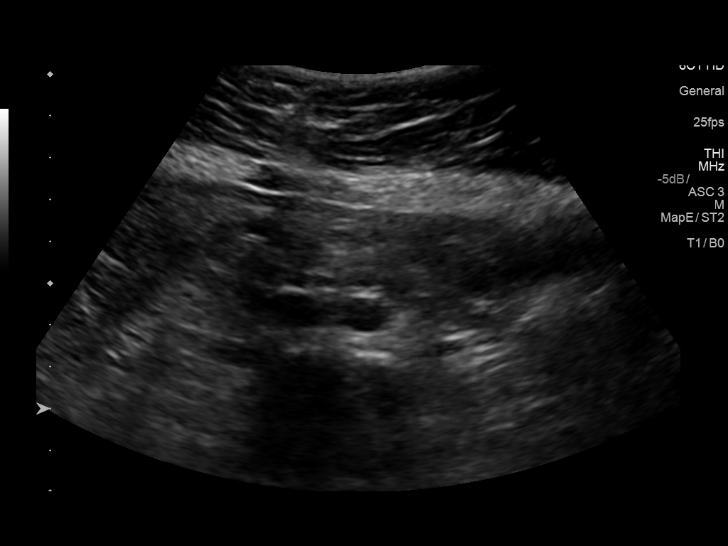

[13 of 25 positions shown; findings below may reference images not displayed]

FINDINGS: Gallbladder: No gallstones or wall thickening visualized. No
sonographic Murphy sign noted by sonographer. Maximal wall thickness
is 1.8 mm, within normal limits.

Common bile duct: Diameter: 4.8 cm, within normal limits.

Liver: The liver is heterogeneous and diffusely echogenic. No
discrete lesions are present. There is loss of normal internal
architecture. Appearance is similar the prior study. Liver contour
is smooth. Portal vein is patent on color Doppler imaging with
normal direction of blood flow towards the liver.

IVC: No abnormality visualized.

Pancreas: Visualized portion unremarkable.

Spleen: Size and appearance within normal limits.

Right Kidney: Length: 10.3 cm, within normal limits. Echogenicity
within normal limits. No mass or hydronephrosis visualized.

Left Kidney: Length: 10.4 cm, within normal limits. Echogenicity
within normal limits. No mass or hydronephrosis visualized.

Abdominal aorta: No aneurysm visualized.

Other findings: None.
IMPRESSION: 1. Stable appearance of echogenic liver suggesting hepatic
steatosis.
2. No acute or focal lesion to explain right upper quadrant
abdominal pain. Gallbladder is within normal limits.

## 2019-08-01 ENCOUNTER — Other Ambulatory Visit: Payer: Self-pay | Admitting: Physician Assistant

## 2019-08-10 ENCOUNTER — Institutional Professional Consult (permissible substitution): Payer: 59 | Admitting: Neurology

## 2019-08-10 ENCOUNTER — Telehealth: Payer: Self-pay

## 2019-08-10 NOTE — Telephone Encounter (Signed)
An unidentified female called office and left a VM advising [place] that pt will not be attending her appt today at 1pm.

## 2019-08-10 NOTE — Telephone Encounter (Signed)
I called pt about a vm left by a female who did not list his name. Pt stated she tried calling Friday and our office was closed. She tried calling Monday but we were closed for the holidays. She found out last week her husband has to have a MRI on his leg. He is unable to drive and may need surgery. I advise pt it may be a no show appt for new pt. Pt stated she will not pay the no show fee, I explain to pt that Angie may waive it due to her accompanying her husband to an MRI and he cannot drive. PT r/s for July due to her husband may need surgery. I already advise Angie to this and she will waive the fee. Pt may still get a letter in mail.

## 2019-08-12 DIAGNOSIS — M65832 Other synovitis and tenosynovitis, left forearm: Secondary | ICD-10-CM | POA: Diagnosis not present

## 2019-08-14 ENCOUNTER — Other Ambulatory Visit: Payer: Self-pay | Admitting: Adult Health

## 2019-09-14 DIAGNOSIS — M1811 Unilateral primary osteoarthritis of first carpometacarpal joint, right hand: Secondary | ICD-10-CM | POA: Diagnosis not present

## 2019-09-14 DIAGNOSIS — R200 Anesthesia of skin: Secondary | ICD-10-CM | POA: Diagnosis not present

## 2019-09-14 DIAGNOSIS — M1812 Unilateral primary osteoarthritis of first carpometacarpal joint, left hand: Secondary | ICD-10-CM | POA: Diagnosis not present

## 2019-09-14 DIAGNOSIS — M65832 Other synovitis and tenosynovitis, left forearm: Secondary | ICD-10-CM | POA: Diagnosis not present

## 2019-09-15 ENCOUNTER — Telehealth: Payer: Self-pay | Admitting: Neurology

## 2019-09-15 ENCOUNTER — Encounter: Payer: Self-pay | Admitting: Neurology

## 2019-09-15 ENCOUNTER — Institutional Professional Consult (permissible substitution): Payer: Self-pay | Admitting: Neurology

## 2019-09-15 NOTE — Telephone Encounter (Signed)
Pt called and stated that she was going to be late for her appt and she would need to r/s.

## 2019-10-06 MED ORDER — BUSPIRONE HCL 15 MG PO TABS: ORAL_TABLET | ORAL | 1 refills | Status: DC

## 2019-10-14 DIAGNOSIS — M65832 Other synovitis and tenosynovitis, left forearm: Secondary | ICD-10-CM | POA: Diagnosis not present

## 2019-10-14 DIAGNOSIS — M1811 Unilateral primary osteoarthritis of first carpometacarpal joint, right hand: Secondary | ICD-10-CM | POA: Diagnosis not present

## 2019-10-24 NOTE — Progress Notes (Signed)
3 MONTH FOLLOW UP Assessment and Plan:  Hiccups Avoid straws No carbonated drinks prilosec in the AM and pepcid at night for 2 weeks and see if better  B12 deficiency -     Iron,Total/Total Iron Binding Cap -     Vitamin B12  Epigastric abdominal pain -     Amylase  Sleep apnea, unspecified type -     Home sleep test - does not want to do in the office test - snoring, fatigue, trouble sleeping- rule out OSA  Essential hypertension - continue medications, DASH diet, exercise and monitor at home. Call if greater than 130/80.   Hyperlipidemia, unspecified hyperlipidemia type -     Lipid panel check lipids decrease fatty foods increase activity.   Recurrent major depressive disorder, in partial remission ([place]) - continue medications, stress management techniques discussed, increase water, good sleep hygiene discussed, increase exercise, and increase veggies.   Smoker Smoking cessation-  instruction/counseling given, counseled patient on the dangers of tobacco use, advised patient to stop smoking, and reviewed strategies to maximize success, patient not ready to quit at this time. Only smoking 2 cigs a day  Vitamin D deficiency -     Recently checked  Medication management -     Magnesium  Chronic obstructive pulmonary disease, unspecified COPD type ([place]) Advised to stop smoking - risks discussed. Declines medications.  CXR annually- last was 02/2019 Currently stable with respiratory medications  Discussed med's effects and SE's. Screening labs and tests as requested with regular follow-up as recommended. Future Appointments  Date Time Provider [place]  10/25/2019 10:30 AM Vicie Mutters, PA-C [facility] None  01/26/2020  2:00 PM Vicie Mutters, PA-C [facility] None    HPI 59 y.o. female  presents for a 3 MONTH FOLLOW UP.   She has history of anxiety, she is on the buspar 15 mg TID, lexapro 20 mg daily.   She complains of hiccups the last 2-3 months.  She had them about a year ago, was taken off valium and that helped but they have returned. Can happen in the middle of the night, through the day, not food related, will get better with 9 sips of water and holding her breath.  Can be 3-8 times a day.  She does not chew gum, no hard candy. She does use a straw to drink, carbonated drinks.  She is on prilosec daily, does not take pepcid daily.  She does have some RUQ pain. Normal AB [place] 12/2017 She never got her sleep study.   BMI is There is no height or weight on file to calculate BMI., she is working on diet and exercise. Wt Readings from Last 3 Encounters:  07/22/19 192 lb 6.4 oz (87.3 kg)  02/12/19 192 lb (87.1 kg)  02/10/19 192 lb (87.1 kg)   She is s/p right TKR Dec 9th 2020 with Dr. Berenice Primas. She is walking now.  Her blood pressure has been controlled at home, today their BP is   She does workout.  She denies chest pain, shortness of breath, dizziness.  She had Ct coronary 02/2018 that showed mild disease, medically treated.   Has COPD/asthma and uses Advair and albuterol.   She is on  claritin, nasal spray.  CXR 02/2019  She snores at night, has fatigue, frequent awakening/nocturia, snoring at night per husband- will refer for sleep study.  She is on cholesterol medication, started previously and denies myalgias. Her cholesterol is not at goal. The cholesterol last visit was:   Lab  Results  Component Value Date   CHOL 164 07/22/2019   HDL 72 07/22/2019   LDLCALC 67 07/22/2019   TRIG 172 (H) 07/22/2019   CHOLHDL 2.3 07/22/2019   Last A1C in the office was:  Lab Results  Component Value Date   HGBA1C 5.3 02/10/2019   B12 was  Lab Results  Component Value Date   ZOXWRUEA54 098 12/02/2018   Patient is on Vitamin D supplement, 5000IU   Lab Results  Component Value Date   VD25OH 31 07/22/2019    Current Medications:    Current Outpatient Medications (Cardiovascular):    diltiazem (CARDIZEM CD) 120 MG 24 hr capsule,  TAKE 1 CAPSULE BY MOUTH EVERY DAY   rosuvastatin (CRESTOR) 10 MG tablet, TAKE 1 TABLET BY MOUTH EVERY DAY FOR CHOLESTEROL  Current Outpatient Medications (Respiratory):    albuterol (VENTOLIN HFA) 108 (90 Base) MCG/ACT inhaler, USE 2 PUFFS EVERY 6 HOURS  AS NEEDED FOR SHORTNESS OF  BREATH   azelastine (ASTELIN) 0.1 % nasal spray, Place 2 sprays into both nostrils 2 (two) times daily. Use in each nostril as directed   fluticasone (FLONASE) 50 MCG/ACT nasal spray, Place 1 spray into both nostrils daily. (Patient taking differently: Place 1 spray into both nostrils daily as needed for allergies. )   Fluticasone-Salmeterol (ADVAIR DISKUS) 100-50 MCG/DOSE AEPB, Inhale 1 puff into the lungs 2 (two) times daily.    Current Outpatient Medications (Other):    busPIRone (BUSPAR) 15 MG tablet, TAKE 1 TABLET BY MOUTH THREE TIMES A DAY FOR CHRONIC ANXIETY   cholecalciferol (VITAMIN D) 1000 UNITS tablet, Take 2,000 Units by mouth daily.    escitalopram (LEXAPRO) 20 MG tablet, 1 TABLET DAILY FOR MOOD   famotidine (PEPCID) 20 MG tablet, Take 1 tablet (20 mg total) by mouth 2 (two) times daily.   Multiple Vitamin (MULTIVITAMIN) capsule, Take 1 capsule by mouth daily.   omeprazole (PRILOSEC) 40 MG capsule, TAKE 1 CAPSULE DAILY FOR GERD   Medical History:  Past Medical History:  Diagnosis Date   Allergy    Arthritis    Asthma    Benign hematuria 2004   Depression    Hyperlipidemia    Hypertension    TMJ (temporomandibular joint syndrome)    Vitamin D deficiency    Allergies Allergies  Allergen Reactions   Other Shortness Of Breath    Household bleach- "takes my breath away"   Azithromycin Hives   Celexa [Citalopram Hydrobromide]     Myalgias   Trintellix [Vortioxetine] Other (See Comments)    Dysphoria    Vicodin [Hydrocodone-Acetaminophen] Nausea And Vomiting   Wellbutrin [Bupropion]     anxiety   SURGICAL HISTORY She  has a past surgical history that includes  Abdominal hysterectomy; Tubal ligation; Breast surgery (Left); Knee arthroscopy w/ debridement (Right, 11/2017); Ankle surgery (Left, 2017); and Total knee arthroplasty (Right, 02/2019). FAMILY HISTORY Her family history includes Arthritis in her mother; Breast cancer in her sister; Cancer in her mother; Hypertension in her father; Stroke in her father. SOCIAL HISTORY She  reports that she has been smoking. She has a 12.50 pack-year smoking history. She has never used smokeless tobacco. She reports current alcohol use. She reports current drug use. Drug: Marijuana.  Review of Systems  Constitutional: Positive for malaise/fatigue. Negative for chills, diaphoresis, fever and weight loss.  HENT: Negative for congestion, ear discharge, ear pain, hearing loss, nosebleeds, sore throat and tinnitus.   Eyes: Negative.   Respiratory: Negative for cough, hemoptysis, sputum production, shortness of  breath, wheezing and stridor.   Cardiovascular: Negative.   Gastrointestinal: Negative for abdominal pain, blood in stool, constipation, diarrhea, heartburn, melena, nausea and vomiting.  Genitourinary: Negative for dysuria, flank pain, frequency, hematuria and urgency.  Musculoskeletal: Negative.   Skin: Negative.   Neurological: Negative.  Negative for weakness and headaches.  Psychiatric/Behavioral: Positive for depression. Negative for hallucinations, memory loss, substance abuse and suicidal ideas. The patient is not nervous/anxious and does not have insomnia (improved with gabapentin).    Physical Exam: Estimated body mass index is 31.05 kg/m as calculated from the following:   Height as of 02/12/19: 5\' 6"  (1.676 m).   Weight as of 07/22/19: 192 lb 6.4 oz (87.3 kg). There were no vitals taken for this visit. General Appearance: Well nourished, in no apparent distress. Eyes: PERRLA, EOMs, conjunctiva no swelling or erythema, normal fundi and vessels. Sinuses: No Frontal/maxillary tenderness ENT/Mouth:  Ext aud canals clear, normal light reflex with TMs without erythema, bulging.  Good dentition. No erythema, swelling, or exudate on post pharynx. Tonsils not swollen or erythematous. Hearing normal. Crowded mouth.  Neck: Supple, thyroid normal. No bruits Respiratory: Respiratory effort normal, BS equal bilaterally without rales, rhonchi, wheezing or stridor. Cardio: RRR without murmurs, rubs or gallops. Brisk peripheral pulses without edema.  Chest: symmetric, with normal excursions and percussion. Abdomen: Soft, +BS. RUQ pain, tender bilateral lower AB, no guarding, rebound, hernias, masses, or organomegaly. .  Lymphatics: Non tender without lymphadenopathy.  Musculoskeletal: Full ROM all peripheral extremities,5/5 strength, and normal gait.  Skin: Warm, dry without rashes, lesions, ecchymosis.  Neuro: Cranial nerves intact, reflexes equal bilaterally. Normal muscle tone, no cerebellar symptoms. Sensation intact.  Psych: Awake and oriented X 3, normal affect, Insight and Judgment appropriate.     Vicie Mutters 7:57 AM

## 2019-10-25 ENCOUNTER — Ambulatory Visit: Payer: BC Managed Care – PPO | Admitting: Physician Assistant

## 2019-10-25 ENCOUNTER — Encounter: Payer: Self-pay | Admitting: Physician Assistant

## 2019-10-25 ENCOUNTER — Other Ambulatory Visit: Payer: Self-pay

## 2019-10-25 VITALS — BP 120/80 | HR 70 | Temp 97.6°F | Wt 190.4 lb

## 2019-10-25 DIAGNOSIS — E538 Deficiency of other specified B group vitamins: Secondary | ICD-10-CM | POA: Diagnosis not present

## 2019-10-25 DIAGNOSIS — R7309 Other abnormal glucose: Secondary | ICD-10-CM

## 2019-10-25 DIAGNOSIS — R1013 Epigastric pain: Secondary | ICD-10-CM

## 2019-10-25 DIAGNOSIS — R066 Hiccough: Secondary | ICD-10-CM

## 2019-10-25 DIAGNOSIS — F3341 Major depressive disorder, recurrent, in partial remission: Secondary | ICD-10-CM | POA: Diagnosis not present

## 2019-10-25 DIAGNOSIS — E785 Hyperlipidemia, unspecified: Secondary | ICD-10-CM | POA: Diagnosis not present

## 2019-10-25 DIAGNOSIS — Z79899 Other long term (current) drug therapy: Secondary | ICD-10-CM

## 2019-10-25 DIAGNOSIS — G4730 Sleep apnea, unspecified: Secondary | ICD-10-CM

## 2019-10-25 DIAGNOSIS — I10 Essential (primary) hypertension: Secondary | ICD-10-CM

## 2019-10-25 DIAGNOSIS — E559 Vitamin D deficiency, unspecified: Secondary | ICD-10-CM

## 2019-10-25 DIAGNOSIS — J449 Chronic obstructive pulmonary disease, unspecified: Secondary | ICD-10-CM | POA: Diagnosis not present

## 2019-10-25 DIAGNOSIS — F17200 Nicotine dependence, unspecified, uncomplicated: Secondary | ICD-10-CM

## 2019-10-25 NOTE — Patient Instructions (Addendum)
Avoid straws No carbonated drinks prilosec in the AM and pepcid at night for 2 weeks and see if better  Hiccups  A hiccup is the result of a sudden irritation of a muscle that is used for breathing (diaphragm). The diaphragm is located under your lungs and above your stomach. When the diaphragm gets irritated, it may quickly tighten without your control (have a spasm). The spasm causes you to quickly suck in air, and that causes your vocal cords to close together quickly. These reactions cause the hiccup sound. Hiccups usually last only a short amount of time (less than 48 hours). In unusual cases, they can last for days or months and require you to see your health care provider. Common causes of hiccups include:  Eating too quickly.  Eating too much food.  Drinking alcohol or bubbly (carbonated) drinks.  Eating or drinking hot or spicy foods and drinks.  Swallowing extra air when sucking on candy or a straw or chewing on gum.  Feeling nervous, stressed, or excited.  Having certain conditions that irritate the diaphragm nerves.  Having metabolic or nervous system disorders. Follow these instructions at home:  Watch your hiccups for any changes.  To prevent hiccups or to lessen discomfort from hiccups: ? Eat and chew your food slowly. ? Eat small meals, and avoid overeating. ? Limit alcohol intake to no more than 1 drink a day for nonpregnant women and 2 drinks a day for men. One drink equals 12 oz of beer, 5 oz of wine, or 1 oz of hard liquor. ? Limit your drinking of carbonated or fizzy drinks, such as soda. ? Avoid eating or drinking hot or spicy foods and drinks.  Take over-the-counter and prescription medicines only as told by your health care provider. Contact a health care provider if:  Your hiccups last for more than 48 hours.  Your hiccups do not improve with treatment.  You cannot sleep or eat because of the hiccups.  You have unexpected weight loss due to the  hiccups.  You have numbness, tingling, or weakness. Get help right away if:  You have trouble breathing or swallowing.  You have severe pain in your abdomen. Summary  A hiccup is the result of a sudden irritation of a muscle that is used for breathing (diaphragm).  Hiccups can be caused by many things, including eating too quickly.  See your health care provider if your hiccups last for more than 48 hours. This information is not intended to replace advice given to you by your health care provider. Make sure you discuss any questions you have with your health care provider. Document Revised: 02/07/2017 Document Reviewed: 11/04/2016 Elsevier Patient Education  2020 [employer].

## 2019-10-26 LAB — AMYLASE: Amylase: 38 U/L (ref 21–101)

## 2019-10-26 LAB — COMPLETE METABOLIC PANEL WITH GFR
AG Ratio: 2 (calc) (ref 1.0–2.5)
ALT: 20 U/L (ref 6–29)
AST: 16 U/L (ref 10–35)
Albumin: 4.3 g/dL (ref 3.6–5.1)
Alkaline phosphatase (APISO): 98 U/L (ref 37–153)
BUN: 10 mg/dL (ref 7–25)
CO2: 28 mmol/L (ref 20–32)
Calcium: 9.6 mg/dL (ref 8.6–10.4)
Chloride: 104 mmol/L (ref 98–110)
Creat: 0.87 mg/dL (ref 0.50–1.05)
GFR, Est African American: 85 mL/min/{1.73_m2} (ref >=60)
GFR, Est Non African American: 73 mL/min/{1.73_m2} (ref >=60)
Globulin: 2.2 g/dL (calc) (ref 1.9–3.7)
Glucose, Bld: 87 mg/dL (ref 65–99)
Potassium: 4.1 mmol/L (ref 3.5–5.3)
Sodium: 140 mmol/L (ref 135–146)
Total Bilirubin: 0.7 mg/dL (ref 0.2–1.2)
Total Protein: 6.5 g/dL (ref 6.1–8.1)

## 2019-10-26 LAB — CBC WITH DIFFERENTIAL/PLATELET
Absolute Monocytes: 708 cells/uL (ref 200–950)
Basophils Absolute: 49 cells/uL (ref 0–200)
Basophils Relative: 0.5 %
Eosinophils Absolute: 97 cells/uL (ref 15–500)
Eosinophils Relative: 1 %
HCT: 42.5 % (ref 35.0–45.0)
Hemoglobin: 14.4 g/dL (ref 11.7–15.5)
Lymphs Abs: 2212 cells/uL (ref 850–3900)
MCH: 31.3 pg (ref 27.0–33.0)
MCHC: 33.9 g/dL (ref 32.0–36.0)
MCV: 92.4 fL (ref 80.0–100.0)
MPV: 11.1 fL (ref 7.5–12.5)
Monocytes Relative: 7.3 %
Neutro Abs: 6635 cells/uL (ref 1500–7800)
Neutrophils Relative %: 68.4 %
Platelets: 296 10*3/uL (ref 140–400)
RBC: 4.6 10*6/uL (ref 3.80–5.10)
RDW: 12.1 % (ref 11.0–15.0)
Total Lymphocyte: 22.8 %
WBC: 9.7 10*3/uL (ref 3.8–10.8)

## 2019-10-26 LAB — IRON, TOTAL/TOTAL IRON BINDING CAP
%SAT: 36 % (calc) (ref 16–45)
Iron: 125 ug/dL (ref 45–160)
TIBC: 343 mcg/dL (calc) (ref 250–450)

## 2019-10-26 LAB — LIPID PANEL
Cholesterol: 160 mg/dL (ref <200)
HDL: 61 mg/dL (ref >=50)
LDL Cholesterol (Calc): 75 mg/dL (calc)
Non-HDL Cholesterol (Calc): 99 mg/dL (calc) (ref <130)
Total CHOL/HDL Ratio: 2.6 (calc) (ref <5.0)
Triglycerides: 166 mg/dL — ABNORMAL HIGH (ref <150)

## 2019-10-26 LAB — HEMOGLOBIN A1C
Hgb A1c MFr Bld: 5.4 % of total Hgb (ref <5.7)
Mean Plasma Glucose: 108 (calc)
eAG (mmol/L): 6 (calc)

## 2019-10-26 LAB — VITAMIN D 25 HYDROXY (VIT D DEFICIENCY, FRACTURES): Vit D, 25-Hydroxy: 40 ng/mL (ref 30–100)

## 2019-10-26 LAB — TSH: TSH: 1.95 mIU/L (ref 0.40–4.50)

## 2019-10-26 LAB — VITAMIN B12: Vitamin B-12: 753 pg/mL (ref 200–1100)

## 2019-10-26 LAB — MAGNESIUM: Magnesium: 1.9 mg/dL (ref 1.5–2.5)

## 2019-10-30 ENCOUNTER — Other Ambulatory Visit: Payer: Self-pay | Admitting: Adult Health

## 2019-10-30 DIAGNOSIS — R0602 Shortness of breath: Secondary | ICD-10-CM

## 2019-10-30 DIAGNOSIS — R1011 Right upper quadrant pain: Secondary | ICD-10-CM

## 2019-11-16 ENCOUNTER — Other Ambulatory Visit: Payer: Self-pay

## 2019-11-16 ENCOUNTER — Ambulatory Visit: Payer: BC Managed Care – PPO | Admitting: Physician Assistant

## 2019-11-16 ENCOUNTER — Encounter: Payer: Self-pay | Admitting: Physician Assistant

## 2019-11-16 VITALS — BP 128/74 | HR 75 | Temp 97.5°F | Wt 187.0 lb

## 2019-11-16 DIAGNOSIS — J441 Chronic obstructive pulmonary disease with (acute) exacerbation: Secondary | ICD-10-CM | POA: Diagnosis not present

## 2019-11-16 LAB — POC COVID19 BINAXNOW: SARS Coronavirus 2 Ag: NEGATIVE

## 2019-11-16 MED ORDER — PROMETHAZINE-DM 6.25-15 MG/5ML PO SYRP: 5.0000 mL | ORAL_SOLUTION | Freq: Four times a day (QID) | ORAL | 1 refills | Status: DC | PRN

## 2019-11-16 MED ORDER — DEXAMETHASONE 0.5 MG PO TABS: ORAL_TABLET | ORAL | 0 refills | Status: DC

## 2019-11-16 MED ORDER — DOXYCYCLINE HYCLATE 100 MG PO CAPS: ORAL_CAPSULE | ORAL | 0 refills | Status: DC

## 2019-11-16 NOTE — Patient Instructions (Signed)
Take doxycycline and decadron Go to the ER if any chest pain, shortness of breath, nausea, dizziness, severe HA, changes vision/speech   Chronic Obstructive Pulmonary Disease  Chronic obstructive pulmonary disease (COPD) is a long-term (chronic) condition that affects the lungs. COPD is a general term that can be used to describe many different lung problems that cause lung swelling (inflammation) and limit airflow, including chronic bronchitis and emphysema. If you have COPD, your lung function will probably never return to normal. In most cases, it gets worse over time. However, there are steps you can take to slow the progression of the disease and improve your quality of life. What are the causes? This condition may be caused by:  Smoking. This is the most common cause.  Certain genes passed down through families. What increases the risk? The following factors may make you more likely to develop this condition:  Secondhand smoke from cigarettes, pipes, or cigars.  Exposure to chemicals and other irritants such as fumes and dust in the work environment.  Chronic lung conditions or infections. What are the signs or symptoms? Symptoms of this condition include:  Shortness of breath, especially during physical activity.  Chronic cough with a large amount of thick mucus. Sometimes the cough may not have any mucus (dry cough).  Wheezing.  Rapid breaths.  Gray or bluish discoloration (cyanosis) of the skin, especially in your fingers, toes, or lips.  Feeling tired (fatigue).  Weight loss.  Chest tightness.  Frequent infections.  Episodes when breathing symptoms become much worse (exacerbations).  Swelling in the ankles, feet, or legs. This may occur in later stages of the disease. How is this diagnosed? This condition is diagnosed based on:  Your medical history.  A physical exam. You may also have tests, including:  Lung (pulmonary) function tests. This may include  a spirometry test, which measures your ability to exhale properly.  Chest X-ray.  CT scan.  Blood tests. How is this treated? This condition may be treated with:  Medicines. These may include inhaled rescue medicines to treat acute exacerbations as well as long-term, or maintenance, medicines to prevent flare-ups of COPD. ? Bronchodilators help treat COPD by dilating the airways to allow increased airflow and make your breathing more comfortable. ? Steroids can reduce airway inflammation and help prevent exacerbations.  Smoking cessation. If you smoke, your health care provider may ask you to quit, and may also recommend therapy or replacement products to help you quit.  Pulmonary rehabilitation. This may involve working with a team of health care providers and specialists, such as respiratory, occupational, and physical therapists.  Exercise and physical activity. These are beneficial for nearly all people with COPD.  Nutrition therapy to gain weight, if you are underweight.  Oxygen. Supplemental oxygen therapy is only helpful if you have a low oxygen level in your blood (hypoxemia).  Lung surgery or transplant.  Palliative care. This is to help people with COPD feel comfortable when treatment is no longer working. Follow these instructions at home: Medicines  Take over-the-counter and prescription medicines (inhaled or pills) only as told by your health care provider.  Talk to your health care provider before taking any cough or allergy medicines. You may need to avoid certain medicines that dry out your airways. Lifestyle  If you are a smoker, the most important thing that you can do is to stop smoking. Do not use any products that contain nicotine or tobacco, such as cigarettes and e-cigarettes. If you need help quitting,  ask your health care provider. Continuing to smoke will cause the disease to progress faster.  Avoid exposure to things that irritate your lungs, such as  smoke, chemicals, and fumes.  Stay active, but balance activity with periods of rest. Exercise and physical activity will help you maintain your ability to do things you want to do.  Learn and use relaxation techniques to manage stress and to control your breathing.  Get the right amount of sleep and get quality sleep. Most adults need 7 or more hours per night.  Eat healthy foods. Eating smaller, more frequent meals and resting before meals may help you maintain your strength. Controlled breathing Learn and use controlled breathing techniques as directed by your health care provider. Controlled breathing techniques include:  Pursed lip breathing. Start by breathing in (inhaling) through your nose for 1 second. Then, purse your lips as if you were going to whistle and breathe out (exhale) through the pursed lips for 2 seconds.  Diaphragmatic breathing. Start by putting one hand on your abdomen just above your waist. Inhale slowly through your nose. The hand on your abdomen should move out. Then purse your lips and exhale slowly. You should be able to feel the hand on your abdomen moving in as you exhale. Controlled coughing Learn and use controlled coughing to clear mucus from your lungs. Controlled coughing is a series of short, progressive coughs. The steps of controlled coughing are: 1. Lean your head slightly forward. 2. Breathe in deeply using diaphragmatic breathing. 3. Try to hold your breath for 3 seconds. 4. Keep your mouth slightly open while coughing twice. 5. Spit any mucus out into a tissue. 6. Rest and repeat the steps once or twice as needed. General instructions  Make sure you receive all the vaccines that your health care provider recommends, especially the pneumococcal and influenza vaccines. Preventing infection and hospitalization is very important when you have COPD.  Use oxygen therapy and pulmonary rehabilitation if directed to by your health care provider. If you  require home oxygen therapy, ask your health care provider whether you should purchase a pulse oximeter to measure your oxygen level at home.  Work with your health care provider to develop a COPD action plan. This will help you know what steps to take if your condition gets worse.  Keep other chronic health conditions under control as told by your health care provider.  Avoid extreme temperature and humidity changes.  Avoid contact with people who have an illness that spreads from person to person (is contagious), such as viral infections or pneumonia.  Keep all follow-up visits as told by your health care provider. This is important. Contact a health care provider if:  You are coughing up more mucus than usual.  There is a change in the color or thickness of your mucus.  Your breathing is more labored than usual.  Your breathing is faster than usual.  You have difficulty sleeping.  You need to use your rescue medicines or inhalers more often than expected.  You have trouble doing routine activities such as getting dressed or walking around the house. Get help right away if:  You have shortness of breath while you are resting.  You have shortness of breath that prevents you from: ? Being able to talk. ? Performing your usual physical activities.  You have chest pain lasting longer than 5 minutes.  Your skin color is more blue (cyanotic) than usual.  You measure low oxygen saturations for longer than  5 minutes with a pulse oximeter.  You have a fever.  You feel too tired to breathe normally. Summary  Chronic obstructive pulmonary disease (COPD) is a long-term (chronic) condition that affects the lungs.  Your lung function will probably never return to normal. In most cases, it gets worse over time. However, there are steps you can take to slow the progression of the disease and improve your quality of life.  Treatment for COPD may include taking medicines, quitting  smoking, pulmonary rehabilitation, and changes to diet and exercise. As the disease progresses, you may need oxygen therapy, a lung transplant, or palliative care.  To help manage your condition, do not smoke, avoid exposure to things that irritate your lungs, stay up to date on all vaccines, and follow your health care provider's instructions for taking medicines. This information is not intended to replace advice given to you by your health care provider. Make sure you discuss any questions you have with your health care provider. Document Revised: 02/07/2017 Document Reviewed: 04/01/2016 Elsevier Patient Education  2020 [employer].

## 2019-11-16 NOTE — Progress Notes (Signed)
Subjective:    Patient ID: Nicole Phelps, female    DOB: 07-Jun-1960, 59 y.o.   MRN: [id number]  HPI 59 y.o. smoking WF presents with cough/congestion x 5 days. She has been vaccinated. She has had HA, sinus pressure, productive cough, with wheezing, SOB. She has been using her albuterol inhaler every 3-4 hours. She is on her Advair.   Lab Results  Component Value Date   WBC 9.7 10/25/2019   HGB 14.4 10/25/2019   HCT 42.5 10/25/2019   MCV 92.4 10/25/2019   PLT 296 10/25/2019    Blood pressure 128/74, pulse 75, temperature (!) 97.5 F (36.4 C), weight 187 lb (84.8 kg), SpO2 96 %.  Medications   Current Outpatient Medications (Cardiovascular):  .  diltiazem (CARDIZEM CD) 120 MG 24 hr capsule, TAKE 1 CAPSULE BY MOUTH EVERY DAY .  rosuvastatin (CRESTOR) 10 MG tablet, TAKE 1 TABLET BY MOUTH EVERY DAY FOR CHOLESTEROL  Current Outpatient Medications (Respiratory):  .  albuterol (VENTOLIN HFA) 108 (90 Base) MCG/ACT inhaler, USE 2 PUFFS EVERY 6 HOURS  AS NEEDED FOR SHORTNESS OF  BREATH .  fluticasone (FLONASE) 50 MCG/ACT nasal spray, Place 1 spray into both nostrils daily. (Patient taking differently: Place 1 spray into both nostrils daily as needed for allergies. ) .  Fluticasone-Salmeterol (ADVAIR DISKUS) 100-50 MCG/DOSE AEPB, Inhale 1 puff into the lungs 2 (two) times daily. Marland Kitchen  azelastine (ASTELIN) 0.1 % nasal spray, Place 2 sprays into both nostrils 2 (two) times daily. Use in each nostril as directed    Current Outpatient Medications (Other):  .  busPIRone (BUSPAR) 15 MG tablet, TAKE 1 TABLET BY MOUTH THREE TIMES A DAY FOR CHRONIC ANXIETY .  cholecalciferol (VITAMIN D) 1000 UNITS tablet, Take 2,000 Units by mouth daily.  Marland Kitchen  escitalopram (LEXAPRO) 20 MG tablet, 1 TABLET DAILY FOR MOOD .  famotidine (PEPCID) 20 MG tablet, Take 1 tablet (20 mg total) by mouth 2 (two) times daily. .  Multiple Vitamin (MULTIVITAMIN) capsule, Take 1 capsule by mouth daily. Marland Kitchen  omeprazole (PRILOSEC) 40  MG capsule, Take 1 capsule Daily to Prevent Heartburn & Indigestion  Problem list She has Hypertension; Hyperlipidemia; Depression; Vitamin D deficiency; Obesity; Smoker; Estrogen deficiency; Asthma; Benign hematuria; B12 deficiency; Chronic obstructive pulmonary disease ([place]); Bruises easily; Mild CAD; and Anxiety state on their problem list.  Review of Systems  Constitutional: Positive for fatigue. Negative for chills and fever.  HENT: Positive for congestion, rhinorrhea, sinus pressure and sore throat. Negative for dental problem, ear discharge, ear pain, nosebleeds, trouble swallowing and voice change.   Respiratory: Positive for cough, shortness of breath and wheezing. Negative for chest tightness.   Cardiovascular: Negative.   Gastrointestinal: Negative.   Genitourinary: Negative.   Musculoskeletal: Negative.   Neurological: Negative.        Objective:   Physical Exam Constitutional:      Appearance: She is well-developed.  HENT:     Head: Normocephalic and atraumatic.     Right Ear: External ear normal.     Left Ear: External ear normal.     Nose: Nose normal.  Eyes:     Conjunctiva/sclera: Conjunctivae normal.     Pupils: Pupils are equal, round, and reactive to light.  Cardiovascular:     Rate and Rhythm: Normal rate and regular rhythm.  Pulmonary:     Effort: Pulmonary effort is normal. No respiratory distress.     Breath sounds: Wheezing (Right anterior middle lobe) present. No rales.  Chest:  Chest wall: No tenderness.  Abdominal:     General: Bowel sounds are normal.     Palpations: Abdomen is soft.  Musculoskeletal:     Cervical back: Normal range of motion and neck supple.  Lymphadenopathy:     Cervical: No cervical adenopathy.  Skin:    General: Skin is warm and dry.  Neurological:     Mental Status: She is alert and oriented to person, place, and time.           Assessment & Plan:  Cady was seen today for acute visit, cough, nasal  congestion, facial pain and otalgia.  Diagnoses and all orders for this visit:  COPD exacerbation ([place]) -     doxycycline (VIBRAMYCIN) 100 MG capsule; Take 1 capsule twice daily with food -     dexamethasone (DECADRON) 0.5 MG tablet; take 1 tablet PO BID for 3 days, then take 1 tablet PO for 4 days. -     promethazine-dextromethorphan (PROMETHAZINE-DM) 6.25-15 MG/5ML syrup; Take 5 mLs by mouth 4 (four) times daily as needed for cough.  Negative COVID in the office- patient vaccinated,  Has been 5 days of symptoms Will treat as COPD exacerbation  Go to the ER if any chest pain, shortness of breath, nausea, dizziness, severe HA, changes vision/speech

## 2019-11-26 ENCOUNTER — Other Ambulatory Visit: Payer: Self-pay | Admitting: Adult Health

## 2019-12-15 ENCOUNTER — Other Ambulatory Visit: Payer: Self-pay | Admitting: Physician Assistant

## 2019-12-15 ENCOUNTER — Other Ambulatory Visit: Payer: Self-pay | Admitting: Adult Health

## 2019-12-16 MED ORDER — ESCITALOPRAM OXALATE 20 MG PO TABS: ORAL_TABLET | ORAL | 0 refills | Status: DC

## 2019-12-16 NOTE — Addendum Note (Signed)
Addended by: Elsie Amis D on: 12/16/2019 04:03 PM   Modules accepted: Orders

## 2020-01-10 ENCOUNTER — Encounter: Payer: 59 | Admitting: Physician Assistant

## 2020-01-26 ENCOUNTER — Ambulatory Visit: Payer: BC Managed Care – PPO | Admitting: Adult Health Nurse Practitioner

## 2020-01-26 ENCOUNTER — Encounter: Payer: Self-pay | Admitting: Adult Health Nurse Practitioner

## 2020-01-26 ENCOUNTER — Other Ambulatory Visit: Payer: Self-pay

## 2020-01-26 VITALS — BP 110/72 | HR 64 | Temp 97.2°F | Ht 65.0 in | Wt 190.0 lb

## 2020-01-26 DIAGNOSIS — Z1389 Encounter for screening for other disorder: Secondary | ICD-10-CM | POA: Diagnosis not present

## 2020-01-26 DIAGNOSIS — Z131 Encounter for screening for diabetes mellitus: Secondary | ICD-10-CM | POA: Diagnosis not present

## 2020-01-26 DIAGNOSIS — Z136 Encounter for screening for cardiovascular disorders: Secondary | ICD-10-CM

## 2020-01-26 DIAGNOSIS — F3341 Major depressive disorder, recurrent, in partial remission: Secondary | ICD-10-CM

## 2020-01-26 DIAGNOSIS — L570 Actinic keratosis: Secondary | ICD-10-CM

## 2020-01-26 DIAGNOSIS — E2839 Other primary ovarian failure: Secondary | ICD-10-CM

## 2020-01-26 DIAGNOSIS — I10 Essential (primary) hypertension: Secondary | ICD-10-CM

## 2020-01-26 DIAGNOSIS — R6889 Other general symptoms and signs: Secondary | ICD-10-CM | POA: Diagnosis not present

## 2020-01-26 DIAGNOSIS — Z0001 Encounter for general adult medical examination with abnormal findings: Secondary | ICD-10-CM

## 2020-01-26 DIAGNOSIS — R7309 Other abnormal glucose: Secondary | ICD-10-CM

## 2020-01-26 DIAGNOSIS — J4520 Mild intermittent asthma, uncomplicated: Secondary | ICD-10-CM

## 2020-01-26 DIAGNOSIS — Z1329 Encounter for screening for other suspected endocrine disorder: Secondary | ICD-10-CM | POA: Diagnosis not present

## 2020-01-26 DIAGNOSIS — E785 Hyperlipidemia, unspecified: Secondary | ICD-10-CM

## 2020-01-26 DIAGNOSIS — F411 Generalized anxiety disorder: Secondary | ICD-10-CM

## 2020-01-26 DIAGNOSIS — E538 Deficiency of other specified B group vitamins: Secondary | ICD-10-CM

## 2020-01-26 DIAGNOSIS — Z79899 Other long term (current) drug therapy: Secondary | ICD-10-CM | POA: Diagnosis not present

## 2020-01-26 DIAGNOSIS — Z130 Encounter for screening for diseases of the blood and blood-forming organs and certain disorders involving the immune mechanism: Secondary | ICD-10-CM

## 2020-01-26 DIAGNOSIS — N898 Other specified noninflammatory disorders of vagina: Secondary | ICD-10-CM

## 2020-01-26 DIAGNOSIS — E559 Vitamin D deficiency, unspecified: Secondary | ICD-10-CM | POA: Diagnosis not present

## 2020-01-26 DIAGNOSIS — Z9189 Other specified personal risk factors, not elsewhere classified: Secondary | ICD-10-CM

## 2020-01-26 DIAGNOSIS — Z13220 Encounter for screening for lipoid disorders: Secondary | ICD-10-CM

## 2020-01-26 DIAGNOSIS — K760 Fatty (change of) liver, not elsewhere classified: Secondary | ICD-10-CM

## 2020-01-26 DIAGNOSIS — F17200 Nicotine dependence, unspecified, uncomplicated: Secondary | ICD-10-CM

## 2020-01-26 DIAGNOSIS — J449 Chronic obstructive pulmonary disease, unspecified: Secondary | ICD-10-CM

## 2020-01-26 NOTE — Progress Notes (Signed)
COMPLETE PHYSICAL  Assessment and Plan:  Nicole Phelps was seen today for annual exam.  Diagnoses and all orders for this visit:  Essential hypertension Continue current medications: Diltiazem 120mg  Monitor blood pressure at home; call if consistently over 130/80 Continue DASH diet.   Reminder to go to the ER if any CP, SOB, nausea, dizziness, severe HA, changes vision/speech, left arm numbness and tingling and jaw pain. -     CBC with Differential/Platelet -     COMPLETE METABOLIC PANEL WITH GFR -     Magnesium -     DG Chest 2 View; Future  Hyperlipidemia, unspecified hyperlipidemia type Continue medications: rosuvastatin 10mg  Discussed dietary and exercise modifications Low fat diet -     Lipid panel  Recurrent major depressive disorder, in partial remission (HCC) Continue medications: Escitalopram 20mg  daily Discussed stress management techniques  Discussed, increase water,intake & good sleep hygiene  Discussed increasing exercise & vegetables in diet  Anxiety state Managing at this time Has BusparPRN  Chronic obstructive pulmonary disease, unspecified COPD type ([place]) No triggers, well controlled symptoms, cont to monitor Continue Advair BID, rinse mouth after use  Smoker Discussed smoking cessation, benefits Discussed health risks of continued behavior Not ready to quit at this time Will continue to assess readiness -     DG Chest 2 View; Future  B12 deficiency -     Vitamin B12  Vitamin D deficiency Continue supplementation to maintain goal of 70-100 Taking Vitamin D 2,000 IU daily Defer vitamin D level -     VITAMIN D 25 Hydroxy (Vit-D Deficiency, Fractures)  Abnormal glucose -     Hemoglobin A1c   Discussed dietary and exercise modifications     Estrogen deficiency  At risk for decreased bone density  Mild intermittent asthma without complication  Hepatic steatosis Per CT 02/12/20  Severe obesity (BMI 35.0-39.9) with comorbidity ([place]) Discussed  dietary and exercise modifications  Screening for thyroid disorder -     TSH  Screening for blood or protein in urine -     Urinalysis w microscopic + reflex cultur -     Microalbumin / creatinine urine ratio  Screening, iron deficiency anemia -     Iron,Total/Total Iron Binding Cap  Screening, ischemic heart disease -     EKG 12-Lead -     Cancel: PAP, TP IMAGING, WNL RFLX HPV  Vaginal discharge  -     WET PREP BY MOLECULAR PROBE Discussed with patient Likely BV? Discussed potential treatment. Continue to monitor  Discussed med's effects and SE's. Screening labs and tests as requested with regular follow-up as recommended. Future Appointments  Date Time Provider [place]  01/25/2021  2:00 PM [name], Danton Sewer, NP [facility] None    HPI 59 y.o. female  presents for a complete physical.  She was seen last visit for multitude of symptoms, including nausea/AB pain, had negative AB US/negative HIDA, saw Dr. Tarri Glenn yesterday and will continue her PPI and add H2 as needed.   She was given gabapentin for anxiety/night sweats, taking 3 x a day and not having any issues, states that has improved She was treated with ABX for possible diverticulitis last visit, states the LLQ pain has improved but she will have some discomfort after intercourse still. Had pelvic US 2011.  She had negative autoimmune labs, negative tick borne illness labs.   BMI is Body mass index is 31.62 kg/m., she is working on diet and exercise. Wt Readings from Last 3 Encounters:  01/26/20 190 lb (  86.2 kg)  11/16/19 187 lb (84.8 kg)  10/25/19 190 lb 6.4 oz (86.4 kg)    Her blood pressure has been controlled at home, today their BP is BP: 110/72 She does workout, walks daily.  She denies chest pain, shortness of breath, dizziness.  She had Ct coronary 02/2018 that showed mild disease, medically treated.   Has COPD/asthma and uses Advair and albuterol.   She is on  claritin, nasal spray.  CXR  12/2017  She is on cholesterol medication, started previously and denies myalgias. Her cholesterol is not at goal. The cholesterol last visit was:   Lab Results  Component Value Date   CHOL 160 10/25/2019   HDL 61 10/25/2019   LDLCALC 75 10/25/2019   TRIG 166 (H) 10/25/2019   CHOLHDL 2.6 10/25/2019   Last A1C in the office was:  Lab Results  Component Value Date   HGBA1C 5.4 10/25/2019   B12 was  Lab Results  Component Value Date   PNTIRWER15 400 10/25/2019   Patient is on Vitamin D supplement, 5000IU   Lab Results  Component Value Date   VD25OH 40 10/25/2019    Current Medications:    Current Outpatient Medications (Cardiovascular):  .  diltiazem (CARDIZEM CD) 120 MG 24 hr capsule, TAKE 1 CAPSULE BY MOUTH EVERY DAY .  rosuvastatin (CRESTOR) 10 MG tablet, TAKE 1 TABLET BY MOUTH EVERY DAY FOR CHOLESTEROL  Current Outpatient Medications (Respiratory):  .  albuterol (VENTOLIN HFA) 108 (90 Base) MCG/ACT inhaler, USE 2 PUFFS EVERY 6 HOURS  AS NEEDED FOR SHORTNESS OF  BREATH .  fluticasone (FLONASE) 50 MCG/ACT nasal spray, Place 1 spray into both nostrils daily. (Patient taking differently: Place 1 spray into both nostrils daily as needed for allergies. ) .  Fluticasone-Salmeterol (ADVAIR DISKUS) 100-50 MCG/DOSE AEPB, Inhale 1 puff into the lungs 2 (two) times daily. Marland Kitchen  azelastine (ASTELIN) 0.1 % nasal spray, Place 2 sprays into both nostrils 2 (two) times daily. Use in each nostril as directed    Current Outpatient Medications (Other):  .  busPIRone (BUSPAR) 15 MG tablet, TAKE 1 TABLET BY MOUTH THREE TIMES A DAY FOR CHRONIC ANXIETY .  cholecalciferol (VITAMIN D) 1000 UNITS tablet, Take 2,000 Units by mouth daily.  Marland Kitchen  escitalopram (LEXAPRO) 20 MG tablet, Take     1 tablet     Daily      for Mood .  famotidine (PEPCID) 20 MG tablet, Take 1 tablet (20 mg total) by mouth 2 (two) times daily. .  Multiple Vitamin (MULTIVITAMIN) capsule, Take 1 capsule by mouth daily. Marland Kitchen   omeprazole (PRILOSEC) 40 MG capsule, Take 1 capsule Daily to Prevent Heartburn & Indigestion   Health Maintenance:   Immunization History  Administered Date(s) Administered  . Influenza Inj Mdck Quad Pf 12/13/2017  . Influenza,inj,Quad PF,6+ Mos 12/18/2018  . Pneumococcal-Unspecified 04/03/2009  . Td 04/03/2004   TDAP: 09/2013 at pharmacy Pneumovax: 2011 Prevnar 13: due at age 63 Flu vaccine: 2020 at [facility]  Zostavax: discussed, will get  Pap: Total abdominal hysterectomy 2003 MGM:05/24/2019 DEXA: 09/2013 normal, DUE Colonoscopy: 08/2017 polyps due 5 years EGD: N/A CXR 12/2017  US pelvis 2011 DEE Dr. Sherlean Foot DUE Dentist: Barney Drain, q 6 months  Medical History:  Past Medical History:  Diagnosis Date  . Allergy   . Arthritis   . Asthma   . Benign hematuria 2004  . Depression   . Hyperlipidemia   . Hypertension   . TMJ (temporomandibular joint syndrome)   .  Vitamin D deficiency    Allergies Allergies  Allergen Reactions  . Other Shortness Of Breath    Household bleach- "takes my breath away"  . Azithromycin Hives  . Celexa [Citalopram Hydrobromide]     Myalgias  . Trintellix [Vortioxetine] Other (See Comments)    Dysphoria   . Vicodin [Hydrocodone-Acetaminophen] Nausea And Vomiting  . Wellbutrin [Bupropion]     anxiety   SURGICAL HISTORY She  has a past surgical history that includes Tubal ligation; Breast surgery (Left); Knee arthroscopy w/ debridement (Right, 11/2017); Ankle surgery (Left, 2017); Total knee arthroplasty (Right, 02/2019); and Total abdominal hysterectomy (2003). FAMILY HISTORY Her family history includes Arthritis in her mother; Breast cancer in her sister; Cancer in her mother; Hypertension in her father; Stroke in her father. SOCIAL HISTORY She  reports that she has been smoking. She has a 12.50 pack-year smoking history. She has never used smokeless tobacco. She reports current alcohol use. She reports current drug use. Drug:  Marijuana.  Review of Systems  Constitutional: Negative for chills, diaphoresis, fever, malaise/fatigue and weight loss.  HENT: Positive for hearing loss. Negative for congestion, ear discharge, ear pain, nosebleeds, sinus pain, sore throat and tinnitus.   Eyes: Negative for blurred vision, double vision, photophobia, pain, discharge and redness.  Respiratory: Negative for cough, hemoptysis, sputum production, shortness of breath, wheezing and stridor.   Cardiovascular: Negative for chest pain, palpitations, orthopnea, claudication, leg swelling and PND.  Gastrointestinal: Negative for abdominal pain, blood in stool, constipation, diarrhea, heartburn, melena, nausea and vomiting.  Genitourinary: Negative for dysuria, flank pain, frequency, hematuria and urgency.  Musculoskeletal: Negative for back pain, falls, joint pain, myalgias and neck pain.  Skin: Negative for itching and rash.  Neurological: Negative for dizziness, tingling, tremors, sensory change, speech change, focal weakness, seizures, loss of consciousness, weakness and headaches.  Endo/Heme/Allergies: Negative for environmental allergies and polydipsia. Does not bruise/bleed easily.  Psychiatric/Behavioral: Negative for depression, hallucinations, memory loss, substance abuse and suicidal ideas. The patient is not nervous/anxious and does not have insomnia.    Physical Exam: Estimated body mass index is 31.62 kg/m as calculated from the following:   Height as of this encounter: 5\' 5"  (1.651 m).   Weight as of this encounter: 190 lb (86.2 kg). BP 110/72   Pulse 64   Temp (!) 97.2 F (36.2 C)   Ht 5\' 5"  (1.651 m)   Wt 190 lb (86.2 kg)   SpO2 96%   BMI 31.62 kg/m  General Appearance: Well nourished, in no apparent distress. Eyes: PERRLA, EOMs, conjunctiva no swelling or erythema, normal fundi and vessels. Sinuses: No Frontal/maxillary tenderness ENT/Mouth: Ext aud canals clear, normal light reflex with TMs without erythema,  bulging. Bilateral hearing aids. Good dentition. No erythema, swelling, or exudate on post pharynx. Tonsils not swollen or erythematous. Hearing normal. Crowded mouth.  Neck: Supple, thyroid normal. No bruits Respiratory: Respiratory effort normal, BS equal bilaterally without rales, rhonchi, wheezing or stridor. Cardio: RRR without murmurs, rubs or gallops. Brisk peripheral pulses without edema.  Chest: symmetric, with normal excursions and percussion. Breasts: Symmetric, without lumps, nipple discharge, or retractions. Abdomen: Soft, +BS. RUQ pain, tender bilateral lower AB, no guarding, rebound, hernias, masses, or organomegaly. .  Lymphatics: Non tender without lymphadenopathy.  Musculoskeletal: Full ROM all peripheral extremities,5/5 strength, and normal gait.  Skin: Warm, dry without rashes, lesions, ecchymosis.  Actinic keratoses, left chest mid clavicular, 9'oclock left breast and right posterior shoulder.  Breast: Mammogram 05/2019, no concerns, no dimpling, obvious deformities, color,  nipple abnormalities or discharge noted. Neuro: Cranial nerves intact, reflexes equal bilaterally. Normal muscle tone, no cerebellar symptoms. Sensation intact.  Psych: Awake and oriented X 3, normal affect, Insight and Judgment appropriate.  Pelvic: External genitalia:  no lesions              Urethra:  normal appearing urethra with no masses, tenderness or lesions              Bartholin's and Skene's: normal                 Vagina: normal appearing vagina with normal color no lesions. Thick white discharge noted.              Cervix: Not present               Pap taken: No /Wet prep related to appearance of discharge. Bimanual Exam: Adnexa: normal adnexa and no mass, fullness, tenderness, bilaterally.  No cervix palpated.          EKG: WNL no ST changes.    Chief Complaint: Patient presents for evaluation of skin lesions. Patient has erythematous, scaly lesions on upper body, chest and shoulder,  changing in shape.   Exam: normal complete skin exam, no suspicious lesions, actinic keratosis  Procedure Details   The risks, benefits, indications, potential complications, and alternatives were explained to the patient and informed verbal consent obtained.  Liquid nitrogen was use in a  Triple freeze and thaw technique. The patient tolerated the procedure well.   Condition: Stable  Complications:  None  Diagnosis: Actinic Keratosis x3   Procedure code:  [id number] x2  Plan: 1. Patient educated that the area will begin to heal in approximately a week.  2. Warning signs of infection were reviewed.    3. Recommended that the patient use OTC acetaminophen as needed for pain.       Garnet Sierras, Laqueta Jean, DNP [facility] 01/26/2020  10:52 PM

## 2020-01-27 LAB — COMPLETE METABOLIC PANEL WITH GFR
AG Ratio: 2.1 (calc) (ref 1.0–2.5)
ALT: 18 U/L (ref 6–29)
AST: 19 U/L (ref 10–35)
Albumin: 4.4 g/dL (ref 3.6–5.1)
Alkaline phosphatase (APISO): 93 U/L (ref 37–153)
BUN: 13 mg/dL (ref 7–25)
CO2: 24 mmol/L (ref 20–32)
Calcium: 10.1 mg/dL (ref 8.6–10.4)
Chloride: 105 mmol/L (ref 98–110)
Creat: 0.79 mg/dL (ref 0.50–1.05)
GFR, Est African American: 95 mL/min/{1.73_m2} (ref >=60)
GFR, Est Non African American: 82 mL/min/{1.73_m2} (ref >=60)
Globulin: 2.1 g/dL (calc) (ref 1.9–3.7)
Glucose, Bld: 86 mg/dL (ref 65–99)
Potassium: 4.1 mmol/L (ref 3.5–5.3)
Sodium: 141 mmol/L (ref 135–146)
Total Bilirubin: 0.5 mg/dL (ref 0.2–1.2)
Total Protein: 6.5 g/dL (ref 6.1–8.1)

## 2020-01-27 LAB — URINALYSIS W MICROSCOPIC + REFLEX CULTURE
Bacteria, UA: NONE SEEN /HPF
Bilirubin Urine: NEGATIVE
Glucose, UA: NEGATIVE
Hyaline Cast: NONE SEEN /LPF
Ketones, ur: NEGATIVE
Leukocyte Esterase: NEGATIVE
Nitrites, Initial: NEGATIVE
Protein, ur: NEGATIVE
Specific Gravity, Urine: 1.016 (ref 1.001–1.03)
pH: 6.5 (ref 5.0–8.0)

## 2020-01-27 LAB — CBC WITH DIFFERENTIAL/PLATELET
Absolute Monocytes: 647 cells/uL (ref 200–950)
Basophils Absolute: 50 cells/uL (ref 0–200)
Basophils Relative: 0.6 %
Eosinophils Absolute: 133 cells/uL (ref 15–500)
Eosinophils Relative: 1.6 %
HCT: 42.4 % (ref 35.0–45.0)
Hemoglobin: 13.8 g/dL (ref 11.7–15.5)
Lymphs Abs: 2731 cells/uL (ref 850–3900)
MCH: 29.7 pg (ref 27.0–33.0)
MCHC: 32.5 g/dL (ref 32.0–36.0)
MCV: 91.2 fL (ref 80.0–100.0)
MPV: 11.1 fL (ref 7.5–12.5)
Monocytes Relative: 7.8 %
Neutro Abs: 4739 cells/uL (ref 1500–7800)
Neutrophils Relative %: 57.1 %
Platelets: 317 10*3/uL (ref 140–400)
RBC: 4.65 10*6/uL (ref 3.80–5.10)
RDW: 12.6 % (ref 11.0–15.0)
Total Lymphocyte: 32.9 %
WBC: 8.3 10*3/uL (ref 3.8–10.8)

## 2020-01-27 LAB — NO CULTURE INDICATED

## 2020-01-27 LAB — MAGNESIUM: Magnesium: 2 mg/dL (ref 1.5–2.5)

## 2020-01-27 LAB — VITAMIN D 25 HYDROXY (VIT D DEFICIENCY, FRACTURES): Vit D, 25-Hydroxy: 37 ng/mL (ref 30–100)

## 2020-01-27 LAB — HEMOGLOBIN A1C
Hgb A1c MFr Bld: 5.3 % of total Hgb (ref <5.7)
Mean Plasma Glucose: 105 (calc)
eAG (mmol/L): 5.8 (calc)

## 2020-01-27 LAB — LIPID PANEL
Cholesterol: 158 mg/dL (ref <200)
HDL: 61 mg/dL (ref >=50)
LDL Cholesterol (Calc): 75 mg/dL (calc)
Non-HDL Cholesterol (Calc): 97 mg/dL (calc) (ref <130)
Total CHOL/HDL Ratio: 2.6 (calc) (ref <5.0)
Triglycerides: 135 mg/dL (ref <150)

## 2020-01-27 LAB — VITAMIN B12: Vitamin B-12: 573 pg/mL (ref 200–1100)

## 2020-01-27 LAB — IRON, TOTAL/TOTAL IRON BINDING CAP
%SAT: 25 % (calc) (ref 16–45)
Iron: 84 ug/dL (ref 45–160)
TIBC: 336 mcg/dL (calc) (ref 250–450)

## 2020-01-27 LAB — MICROALBUMIN / CREATININE URINE RATIO
Creatinine, Urine: 148 mg/dL (ref 20–275)
Microalb Creat Ratio: 5 mcg/mg creat (ref <30)
Microalb, Ur: 0.7 mg/dL

## 2020-01-27 LAB — TSH: TSH: 3.47 mIU/L (ref 0.40–4.50)

## 2020-01-27 LAB — WET PREP BY MOLECULAR PROBE
Candida species: NOT DETECTED
Gardnerella vaginalis: NOT DETECTED
MICRO NUMBER:: 11218488
SPECIMEN QUALITY:: ADEQUATE
Trichomonas vaginosis: NOT DETECTED

## 2020-01-29 ENCOUNTER — Other Ambulatory Visit: Payer: Self-pay | Admitting: Internal Medicine

## 2020-01-29 DIAGNOSIS — R0602 Shortness of breath: Secondary | ICD-10-CM

## 2020-01-29 DIAGNOSIS — R1011 Right upper quadrant pain: Secondary | ICD-10-CM

## 2020-01-30 ENCOUNTER — Other Ambulatory Visit: Payer: Self-pay | Admitting: Adult Health Nurse Practitioner

## 2020-01-30 DIAGNOSIS — B9689 Other specified bacterial agents as the cause of diseases classified elsewhere: Secondary | ICD-10-CM

## 2020-01-30 DIAGNOSIS — N760 Acute vaginitis: Secondary | ICD-10-CM

## 2020-01-30 MED ORDER — METRONIDAZOLE 0.75 % VA GEL: 1.0000 | Freq: Every day | VAGINAL | 0 refills | Status: DC

## 2020-02-19 ENCOUNTER — Other Ambulatory Visit: Payer: Self-pay | Admitting: Adult Health

## 2020-03-08 ENCOUNTER — Other Ambulatory Visit: Payer: Self-pay | Admitting: Adult Health

## 2020-03-15 ENCOUNTER — Other Ambulatory Visit: Payer: Self-pay | Admitting: *Deleted

## 2020-03-15 MED ORDER — ESCITALOPRAM OXALATE 20 MG PO TABS
ORAL_TABLET | ORAL | 0 refills | Status: DC
Start: 2020-03-15 — End: 2020-05-02

## 2020-03-29 ENCOUNTER — Encounter: Payer: Self-pay | Admitting: Adult Health Nurse Practitioner

## 2020-03-29 ENCOUNTER — Ambulatory Visit: Payer: BC Managed Care – PPO | Admitting: Adult Health Nurse Practitioner

## 2020-03-29 ENCOUNTER — Other Ambulatory Visit: Payer: Self-pay

## 2020-03-29 VITALS — HR 61 | Temp 97.5°F

## 2020-03-29 DIAGNOSIS — R059 Cough, unspecified: Secondary | ICD-10-CM

## 2020-03-29 DIAGNOSIS — J310 Chronic rhinitis: Secondary | ICD-10-CM | POA: Diagnosis not present

## 2020-03-29 DIAGNOSIS — J441 Chronic obstructive pulmonary disease with (acute) exacerbation: Secondary | ICD-10-CM

## 2020-03-29 DIAGNOSIS — J449 Chronic obstructive pulmonary disease, unspecified: Secondary | ICD-10-CM | POA: Diagnosis not present

## 2020-03-29 DIAGNOSIS — K219 Gastro-esophageal reflux disease without esophagitis: Secondary | ICD-10-CM

## 2020-03-29 DIAGNOSIS — Z1152 Encounter for screening for COVID-19: Secondary | ICD-10-CM

## 2020-03-29 LAB — POC COVID19 BINAXNOW: SARS Coronavirus 2 Ag: POSITIVE — AB

## 2020-03-29 MED ORDER — DEXAMETHASONE 4 MG PO TABS: ORAL_TABLET | ORAL | 1 refills | Status: DC

## 2020-03-29 MED ORDER — FAMOTIDINE 20 MG PO TABS: 20.0000 mg | ORAL_TABLET | Freq: Two times a day (BID) | ORAL | 3 refills | Status: DC

## 2020-03-29 MED ORDER — BENZONATATE 100 MG PO CAPS: 200.0000 mg | ORAL_CAPSULE | Freq: Three times a day (TID) | ORAL | 0 refills | Status: DC | PRN

## 2020-03-29 MED ORDER — DOXYCYCLINE HYCLATE 100 MG PO TABS: 100.0000 mg | ORAL_TABLET | Freq: Two times a day (BID) | ORAL | 0 refills | Status: AC

## 2020-03-29 NOTE — Progress Notes (Signed)
Virtual Visit via Telephone Note  I connected with Nicole Phelps on 03/29/20 at 11:00 AM EST by telephone and verified that I am speaking with the correct person using two identifiers.   I discussed the limitations, risks, security and privacy concerns of performing an evaluation and management service by telephone and the availability of in person appointments. I also discussed with the patient that there may be a patient responsible charge related to this service. The patient expressed understanding and agreed to proceed.   Assessment and Plan:  Nicole Phelps was seen today for fever.  Diagnoses and all orders for this visit:  Encounter for screening for COVID-19 -     POC COVID-19 BinaxNow  Chronic obstructive pulmonary disease, unspecified COPD type ([place]) -     benzonatate (TESSALON PERLES) 100 MG capsule; Take 2 capsules (200 mg total) by mouth 3 (three) times daily as needed for cough (Max: 600mg  per day).  Cough -     benzonatate (TESSALON PERLES) 100 MG capsule; Take 2 capsules (200 mg total) by mouth 3 (three) times daily as needed for cough (Max: 600mg  per day). Continue Mucinex DM while having symptoms.  Chronic rhinitis Continue flonase, saline spray PRN  COPD with acute exacerbation (HCC) -     dexamethasone (DECADRON) 4 MG tablet; Take 1 tab 3 x day - 3 days, then 2 x day - 3 days, then 1 tab daily -     doxycycline (VIBRA-TABS) 100 MG tablet; Take 1 tablet (100 mg total) by mouth 2 (two) times daily for 7 days.  Gastroesophageal reflux disease, unspecified whether esophagitis present -     famotidine (PEPCID) 20 MG tablet; Take 1 tablet (20 mg total) by mouth 2 (two) times daily.     Follow Up Instructions:    I discussed the assessment and treatment plan with the patient. The patient was provided an opportunity to ask questions and all were answered. The patient agreed with the plan and demonstrated an understanding of the instructions.   The patient was advised to  call back or seek an in-person evaluation if the symptoms worsen or if the condition fails to improve as anticipated.  I provided 20 minutes of non-face-to-face time during this encounter including counseling, chart review, and critical decision making was preformed.   Future Appointments  Date Time Provider [place]  05/02/2020  2:30 PM Garnet Sierras, NP [facility] None  01/25/2021  2:00 PM Garnet Sierras, NP [facility] None      History of Present Illness:  60 y.o. female presents for symptoms that start 3 days ago.  She started with headache runny nose, sore throat, fatigue cough, some diarrhea. Cough is productive and worse in the morning.  She has been taking zyrtec and ibuprofen or tylenol.  She has also been using flonase daily.  She has also been using tessalon pearls that have helped with the cough. The cough is waking her up at night.   Denies shortness of breath, wheezing, chest pains, nausea vomiting or diarrhea.   Medical History:  Past Medical History:  Diagnosis Date  . Allergy   . Arthritis   . Asthma   . Benign hematuria 2004  . Depression   . Hyperlipidemia   . Hypertension   . TMJ (temporomandibular joint syndrome)   . Vitamin D deficiency     Current Medications:  Current Outpatient Medications on File Prior to Visit  Medication Sig  . albuterol (VENTOLIN HFA) 108 (90 Base) MCG/ACT inhaler USE 2  PUFFS EVERY 6 HOURS  AS NEEDED FOR SHORTNESS OF  BREATH  . azelastine (ASTELIN) 0.1 % nasal spray Place 2 sprays into both nostrils 2 (two) times daily. Use in each nostril as directed  . busPIRone (BUSPAR) 15 MG tablet TAKE 1 TABLET BY MOUTH THREE TIMES A DAY FOR CHRONIC ANXIETY  . cholecalciferol (VITAMIN D) 1000 UNITS tablet Take 2,000 Units by mouth daily.   Marland Kitchen diltiazem (CARDIZEM CD) 120 MG 24 hr capsule Take      1 capsule       Daily       for BP  . escitalopram (LEXAPRO) 20 MG tablet Take     1 tablet     Daily      for Mood  . fluticasone  (FLONASE) 50 MCG/ACT nasal spray Place 1 spray into both nostrils daily. (Patient taking differently: Place 1 spray into both nostrils daily as needed for allergies. )  . Fluticasone-Salmeterol (ADVAIR DISKUS) 100-50 MCG/DOSE AEPB Inhale 1 puff into the lungs 2 (two) times daily.  . metroNIDAZOLE (METROGEL) 0.75 % vaginal gel Place 1 Applicatorful vaginally at bedtime. 1 applicator at night for 5 days  . Multiple Vitamin (MULTIVITAMIN) capsule Take 1 capsule by mouth daily.  Marland Kitchen omeprazole (PRILOSEC) 40 MG capsule Take      1 capsule     Daily         to prevent Heartburn & Indigestion  . rosuvastatin (CRESTOR) 10 MG tablet Take      1 tablet       Daily        for Cholesterol   No current facility-administered medications on file prior to visit.    Allergies:  Allergies  Allergen Reactions  . Other Shortness Of Breath    Household bleach- "takes my breath away"  . Azithromycin Hives  . Celexa [Citalopram Hydrobromide]     Myalgias  . Trintellix [Vortioxetine] Other (See Comments)    Dysphoria   . Vicodin [Hydrocodone-Acetaminophen] Nausea And Vomiting  . Wellbutrin [Bupropion]     anxiety      Review of Systems:  Review of Systems  Constitutional: Positive for chills, fever and malaise/fatigue. Negative for diaphoresis and weight loss.  HENT: Positive for congestion, sinus pain and sore throat. Negative for ear discharge, ear pain, hearing loss, nosebleeds and tinnitus.   Eyes: Negative for blurred vision, double vision, photophobia, pain, discharge and redness.  Respiratory: Positive for cough and sputum production. Negative for hemoptysis, shortness of breath and stridor.   Cardiovascular: Negative for chest pain, palpitations, orthopnea, claudication, leg swelling and PND.  Gastrointestinal: Negative for abdominal pain, blood in stool, constipation, diarrhea, heartburn, melena, nausea and vomiting.  Genitourinary: Negative for dysuria and urgency.  Musculoskeletal: Positive for  myalgias. Negative for back pain, joint pain and neck pain.  Skin: Negative for itching and rash.  Neurological: Positive for headaches. Negative for dizziness, tingling, tremors and sensory change.  Psychiatric/Behavioral: Negative for depression and suicidal ideas.     Observations/Objective: General : Well sounding patient in no apparent distress HEENT: no hoarseness, no cough for duration of visit Lungs: speaks in complete sentences, no audible wheezing, no apparent distress Neurological: alert, oriented x 3 Psychiatric: pleasant, judgement appropriate      Garnet Sierras, NP [facility] 03/29/2020  11:44 AM ~

## 2020-03-29 NOTE — Patient Instructions (Signed)
  Today you had a telephone visit with Nicole Sierras, DNP.  Below is a summary of your visit.  After you review the information send me back a quick reply so I know you received the information. This is ALSO for Mr Prevost.  We have sent in Doxycycline for you and Azithromyacin(for Mr Pixler), take as directed and complete whole course.  A Dexamethasone steroid taper has been sent to the pharmacy for you to help reduce inflammation.  Please take pepsid 20mg  twice a day for you and 40mg  twice a day(Mr Milburn) while you are taking steroids to reduce further stomach upset.   At the pharmacy purchase and Oxygen sensor.  Goal is 93% or higher.  Make sure your finger is warm.  Nail polish may alter readings.  IF reading is less that 93% take a few deep breaths in through your nose and out your mouth to increase the readings.  **IF you are less than 90% and short of breath please SEEK IMMEDIATE ATTENTION via 911.**   Chest Congestion: Continue Taking the Mucinex DM every 12 hours.  You can try humidification or Warm steamy shower to help loosen chest.  Cough: You may take the Delsym cough syrup, be mindful of timing as there is a small amount of this in the Mucinex DM. We will also send in tessalon pearls to help with cough. Sore throat For your sore throat, gargle salt water and spit out.  Throat lozengers or chloraseptic spray.   Runny nose, ear fullness Try to change antihistamine from Levocitrizine to Cetirizine (Zyrtec) every night to help dry up any fluid draining in back of your throat.  IF this does not work try Human resources officer (Fexofenadine).  Use the antihistamine that best gives you the drying effect.   Nasal Congestion: Take Sudafed as directed.  If you take blood pressure medication, monitor your blood pressure as it can increase if taking this medication.  Body Aches:  You can take Acetaminophen (Tylenol-focuses on pain fever reduction) 1,000mg  every 8 hours alternating with Ibuprofen  (Advil-focuses on inflammation, pain, fever) 600mg  every 6hours OR 800mg  every 8hours.   Continue to take Vitamin D supplement to help your immune system.   Stay hydrated drinking lots of water or some Gatorade.  IF your taste and smell is absent, be mindful of eating salty food or adding salt to foods.  You will be able to taste salt OR the first to return so be careful as this can cause you to retain fluid and increase blood pressure.   Please let us know if you have any new or worsening symptoms or need any help with symptoms management.   Below is summary of CDC guidance for testing positive and for exposure.        Nicole Phelps, Laqueta Jean, DNP [facility] 03/29/2020  11:35 AM

## 2020-04-12 ENCOUNTER — Other Ambulatory Visit: Payer: Self-pay | Admitting: Adult Health

## 2020-04-28 ENCOUNTER — Ambulatory Visit
Admission: RE | Admit: 2020-04-28 | Discharge: 2020-04-28 | Disposition: A | Discharge status: Home / Self Care | Payer: BC Managed Care – PPO | Source: Ambulatory Visit | Attending: Adult Health Nurse Practitioner | Admitting: Adult Health Nurse Practitioner

## 2020-04-28 DIAGNOSIS — R0789 Other chest pain: Secondary | ICD-10-CM | POA: Diagnosis not present

## 2020-04-28 DIAGNOSIS — F17200 Nicotine dependence, unspecified, uncomplicated: Secondary | ICD-10-CM

## 2020-04-28 DIAGNOSIS — I10 Essential (primary) hypertension: Secondary | ICD-10-CM

## 2020-05-01 ENCOUNTER — Other Ambulatory Visit: Payer: Self-pay

## 2020-05-01 DIAGNOSIS — J45909 Unspecified asthma, uncomplicated: Secondary | ICD-10-CM

## 2020-05-01 DIAGNOSIS — R0602 Shortness of breath: Secondary | ICD-10-CM

## 2020-05-01 MED ORDER — ALBUTEROL SULFATE HFA 108 (90 BASE) MCG/ACT IN AERS: INHALATION_SPRAY | RESPIRATORY_TRACT | 1 refills | Status: DC

## 2020-05-02 ENCOUNTER — Encounter: Payer: Self-pay | Admitting: Adult Health Nurse Practitioner

## 2020-05-02 ENCOUNTER — Ambulatory Visit: Payer: BC Managed Care – PPO | Admitting: Adult Health Nurse Practitioner

## 2020-05-02 ENCOUNTER — Other Ambulatory Visit: Payer: Self-pay

## 2020-05-02 VITALS — BP 126/80 | HR 82 | Temp 97.1°F | Wt 192.0 lb

## 2020-05-02 DIAGNOSIS — E785 Hyperlipidemia, unspecified: Secondary | ICD-10-CM

## 2020-05-02 DIAGNOSIS — F3341 Major depressive disorder, recurrent, in partial remission: Secondary | ICD-10-CM | POA: Diagnosis not present

## 2020-05-02 DIAGNOSIS — Z79899 Other long term (current) drug therapy: Secondary | ICD-10-CM

## 2020-05-02 DIAGNOSIS — E559 Vitamin D deficiency, unspecified: Secondary | ICD-10-CM

## 2020-05-02 DIAGNOSIS — T07XXXA Unspecified multiple injuries, initial encounter: Secondary | ICD-10-CM

## 2020-05-02 DIAGNOSIS — F411 Generalized anxiety disorder: Secondary | ICD-10-CM

## 2020-05-02 DIAGNOSIS — J4520 Mild intermittent asthma, uncomplicated: Secondary | ICD-10-CM

## 2020-05-02 DIAGNOSIS — R0602 Shortness of breath: Secondary | ICD-10-CM

## 2020-05-02 DIAGNOSIS — K219 Gastro-esophageal reflux disease without esophagitis: Secondary | ICD-10-CM

## 2020-05-02 DIAGNOSIS — E538 Deficiency of other specified B group vitamins: Secondary | ICD-10-CM

## 2020-05-02 DIAGNOSIS — R1011 Right upper quadrant pain: Secondary | ICD-10-CM

## 2020-05-02 DIAGNOSIS — F17200 Nicotine dependence, unspecified, uncomplicated: Secondary | ICD-10-CM

## 2020-05-02 DIAGNOSIS — J45909 Unspecified asthma, uncomplicated: Secondary | ICD-10-CM

## 2020-05-02 DIAGNOSIS — I10 Essential (primary) hypertension: Secondary | ICD-10-CM

## 2020-05-02 DIAGNOSIS — J449 Chronic obstructive pulmonary disease, unspecified: Secondary | ICD-10-CM

## 2020-05-02 DIAGNOSIS — R7309 Other abnormal glucose: Secondary | ICD-10-CM

## 2020-05-02 LAB — CBC WITH DIFFERENTIAL/PLATELET
Absolute Monocytes: 979 cells/uL — ABNORMAL HIGH (ref 200–950)
Basophils Absolute: 48 cells/uL (ref 0–200)
Basophils Relative: 0.5 %
Eosinophils Absolute: 162 cells/uL (ref 15–500)
Eosinophils Relative: 1.7 %
HCT: 40 % (ref 35.0–45.0)
Hemoglobin: 13.8 g/dL (ref 11.7–15.5)
Lymphs Abs: 2385 cells/uL (ref 850–3900)
MCH: 31 pg (ref 27.0–33.0)
MCHC: 34.5 g/dL (ref 32.0–36.0)
MCV: 89.9 fL (ref 80.0–100.0)
MPV: 10.7 fL (ref 7.5–12.5)
Monocytes Relative: 10.3 %
Neutro Abs: 5928 cells/uL (ref 1500–7800)
Neutrophils Relative %: 62.4 %
Platelets: 409 10*3/uL — ABNORMAL HIGH (ref 140–400)
RBC: 4.45 10*6/uL (ref 3.80–5.10)
RDW: 12.8 % (ref 11.0–15.0)
Total Lymphocyte: 25.1 %
WBC: 9.5 10*3/uL (ref 3.8–10.8)

## 2020-05-02 LAB — COMPLETE METABOLIC PANEL WITH GFR
AG Ratio: 2.2 (calc) (ref 1.0–2.5)
ALT: 16 U/L (ref 6–29)
AST: 17 U/L (ref 10–35)
Albumin: 4.3 g/dL (ref 3.6–5.1)
Alkaline phosphatase (APISO): 103 U/L (ref 37–153)
BUN: 13 mg/dL (ref 7–25)
CO2: 29 mmol/L (ref 20–32)
Calcium: 9.5 mg/dL (ref 8.6–10.4)
Chloride: 105 mmol/L (ref 98–110)
Creat: 1 mg/dL (ref 0.50–1.05)
GFR, Est African American: 71 mL/min/{1.73_m2} (ref >=60)
GFR, Est Non African American: 62 mL/min/{1.73_m2} (ref >=60)
Globulin: 2 g/dL (calc) (ref 1.9–3.7)
Glucose, Bld: 96 mg/dL (ref 65–99)
Potassium: 4 mmol/L (ref 3.5–5.3)
Sodium: 141 mmol/L (ref 135–146)
Total Bilirubin: 0.3 mg/dL (ref 0.2–1.2)
Total Protein: 6.3 g/dL (ref 6.1–8.1)

## 2020-05-02 LAB — LIPID PANEL
Cholesterol: 151 mg/dL (ref <200)
HDL: 57 mg/dL (ref >=50)
LDL Cholesterol (Calc): 68 mg/dL (calc)
Non-HDL Cholesterol (Calc): 94 mg/dL (calc) (ref <130)
Total CHOL/HDL Ratio: 2.6 (calc) (ref <5.0)
Triglycerides: 179 mg/dL — ABNORMAL HIGH (ref <150)

## 2020-05-02 MED ORDER — FAMOTIDINE 20 MG PO TABS
ORAL_TABLET | ORAL | 1 refills | Status: DC
Start: 2020-05-02 — End: 2020-11-21

## 2020-05-02 MED ORDER — OMEPRAZOLE 40 MG PO CPDR: DELAYED_RELEASE_CAPSULE | ORAL | 0 refills | Status: DC

## 2020-05-02 MED ORDER — ALBUTEROL SULFATE HFA 108 (90 BASE) MCG/ACT IN AERS: INHALATION_SPRAY | RESPIRATORY_TRACT | 1 refills | Status: DC

## 2020-05-02 MED ORDER — FLUTICASONE-SALMETEROL 100-50 MCG/DOSE IN AEPB: 1.0000 | INHALATION_SPRAY | Freq: Two times a day (BID) | RESPIRATORY_TRACT | 2 refills | Status: DC

## 2020-05-02 MED ORDER — DILTIAZEM HCL ER COATED BEADS 120 MG PO CP24
ORAL_CAPSULE | ORAL | 2 refills | Status: DC
Start: 2020-05-02 — End: 2021-01-25

## 2020-05-02 MED ORDER — ROSUVASTATIN CALCIUM 10 MG PO TABS: ORAL_TABLET | ORAL | 2 refills | Status: DC

## 2020-05-02 MED ORDER — BUSPIRONE HCL 15 MG PO TABS: ORAL_TABLET | ORAL | 2 refills | Status: DC

## 2020-05-02 MED ORDER — ESCITALOPRAM OXALATE 20 MG PO TABS
ORAL_TABLET | ORAL | 2 refills | Status: DC
Start: 2020-05-02 — End: 2021-01-25

## 2020-05-02 NOTE — Progress Notes (Signed)
FOLLOW UP 3 MONTH  Assessment and Plan:  Nicole Phelps was seen today for annual exam.  Diagnoses and all orders for this visit:  Essential hypertension Continue current medications: Diltiazem 120mg  Monitor blood pressure at home; call if consistently over 130/80 Continue DASH diet.   Reminder to go to the ER if any CP, SOB, nausea, dizziness, severe HA, changes vision/speech, left arm numbness and tingling and jaw pain. -     CBC with Differential/Platelet -     COMPLETE METABOLIC PANEL WITH GFR -     Magnesium -     DG Chest 2 View; Future  Hyperlipidemia, unspecified hyperlipidemia type Continue medications: rosuvastatin 10mg  Discussed dietary and exercise modifications Low fat diet -     Lipid panel  Recurrent major depressive disorder, in partial remission (HCC) Continue medications: Escitalopram 20mg  daily Discussed stress management techniques  Discussed, increase water,intake & good sleep hygiene  Discussed increasing exercise & vegetables in diet  Anxiety state Managing at this time Has BusparPRN  Chronic obstructive pulmonary disease, unspecified COPD type ([place]) No triggers, well controlled symptoms, cont to monitor Continue Advair BID, rinse mouth after use  Smoker Discussed smoking cessation, benefits Discussed health risks of continued behavior Not ready to quit at this time Will continue to assess readiness -     DG Chest 2 View; Future  B12 deficiency -     Vitamin B12  Vitamin D deficiency Continue supplementation to maintain goal of 70-100 Taking Vitamin D 2,000 IU daily Defer vitamin D level -     VITAMIN D 25 Hydroxy (Vit-D Deficiency, Fractures)  Abnormal glucose -     Hemoglobin A1c   Discussed dietary and exercise modifications   Mild intermittent asthma without complication  Discussed med's effects and SE's. Screening labs and tests as requested with regular follow-up as recommended. Future Appointments  Date Time Provider [address]  05/02/2020  2:30 PM Garnet Sierras, NP [facility] None  01/25/2021  2:00 PM Davyn Elsasser, Danton Sewer, NP [facility] None    HPI 60 y.o. female  presents for 3 month follow up for HTN, HLD, anxiety, COPD, smoker, Vitamin d defciency and weight.  She reports overall she is doing well.  She does not have any  Health or medication concerns today.  She was seen last visit for multitude of symptoms, including nausea/AB pain, had negative AB US/negative HIDA, saw Dr. Tarri Glenn previously.  Has since stopped PPI.  She is using Famotidine daily, reports helping with her hiccups.  She was given gabapentin for anxiety/night sweats, taking 3 x a day and not having any issues, states that has improved greatly and she is happy and feels good.  She was treated with ABX for possible diverticulitis last visit, states the LLQ pain has improved but she will have some discomfort after intercourse still. Had pelvic US 2011.  She had negative autoimmune labs, negative tick borne illness labs.   BMI is There is no height or weight on file to calculate BMI., she is working on diet and exercise. Wt Readings from Last 3 Encounters:  01/26/20 190 lb (86.2 kg)  11/16/19 187 lb (84.8 kg)  10/25/19 190 lb 6.4 oz (86.4 kg)    Her blood pressure has been controlled at home, today their BP is   She does workout, walks daily.  She denies chest pain, shortness of breath, dizziness.  She had Ct coronary 02/2018 that showed mild disease, medically treated.   Has COPD/asthma and uses Advair and albuterol.   She  is on  claritin, nasal spray.  CXR 12/2017  She is on cholesterol medication, started previously and denies myalgias. Her cholesterol is not at goal. The cholesterol last visit was:   Lab Results  Component Value Date   CHOL 158 01/26/2020   HDL 61 01/26/2020   LDLCALC 75 01/26/2020   TRIG 135 01/26/2020   CHOLHDL 2.6 01/26/2020   Last A1C in the office was:  Lab Results  Component Value Date   HGBA1C 5.3  01/26/2020   B12 was  Lab Results  Component Value Date   GLOVFIEP32 951 01/26/2020   Patient is on Vitamin D supplement, 5000IU   Lab Results  Component Value Date   VD25OH 37 01/26/2020    Current Medications:   Current Outpatient Medications (Endocrine & Metabolic):  .  dexamethasone (DECADRON) 4 MG tablet, Take 1 tab 3 x day - 3 days, then 2 x day - 3 days, then 1 tab daily  Current Outpatient Medications (Cardiovascular):  .  diltiazem (CARDIZEM CD) 120 MG 24 hr capsule, Take      1 capsule       Daily       for BP .  rosuvastatin (CRESTOR) 10 MG tablet, Take      1 tablet       Daily        for Cholesterol  Current Outpatient Medications (Respiratory):  .  albuterol (VENTOLIN HFA) 108 (90 Base) MCG/ACT inhaler, USE 2 PUFFS EVERY 6 HOURS  AS NEEDED FOR SHORTNESS OF  BREATH .  azelastine (ASTELIN) 0.1 % nasal spray, Place 2 sprays into both nostrils 2 (two) times daily. Use in each nostril as directed .  benzonatate (TESSALON PERLES) 100 MG capsule, Take 2 capsules (200 mg total) by mouth 3 (three) times daily as needed for cough (Max: 600mg  per day). .  fluticasone (FLONASE) 50 MCG/ACT nasal spray, Place 1 spray into both nostrils daily. (Patient taking differently: Place 1 spray into both nostrils daily as needed for allergies. ) .  Fluticasone-Salmeterol (ADVAIR DISKUS) 100-50 MCG/DOSE AEPB, Inhale 1 puff into the lungs 2 (two) times daily.    Current Outpatient Medications (Other):  .  busPIRone (BUSPAR) 15 MG tablet, TAKE 1 TABLET BY MOUTH THREE TIMES A DAY FOR CHRONIC ANXIETY .  cholecalciferol (VITAMIN D) 1000 UNITS tablet, Take 2,000 Units by mouth daily.  Marland Kitchen  escitalopram (LEXAPRO) 20 MG tablet, Take     1 tablet     Daily      for Mood .  famotidine (PEPCID) 20 MG tablet, Take 1 tablet (20 mg total) by mouth 2 (two) times daily. .  metroNIDAZOLE (METROGEL) 0.75 % vaginal gel, Place 1 Applicatorful vaginally at bedtime. 1 applicator at night for 5 days .  Multiple  Vitamin (MULTIVITAMIN) capsule, Take 1 capsule by mouth daily. Marland Kitchen  omeprazole (PRILOSEC) 40 MG capsule, Take      1 capsule     Daily         to prevent Heartburn & Indigestion   Health Maintenance:   Immunization History  Administered Date(s) Administered  . Influenza Inj Mdck Quad Pf 12/13/2017  . Influenza,inj,Quad PF,6+ Mos 12/18/2018  . Pneumococcal-Unspecified 04/03/2009  . Td 04/03/2004   TDAP: 09/2013 at pharmacy Pneumovax: 2011 Prevnar 13: due at age 57 Flu vaccine: 2020 at [facility]  Zostavax: discussed, will get  Pap: Total abdominal hysterectomy 2003 MGM:05/24/2019 DEXA: 09/2013 normal, DUE Colonoscopy: 08/2017 polyps due 5 years EGD: N/A CXR 12/2017  US pelvis  2011 DEE Dr. Sherlean Foot DUE Dentist: Barney Drain, q 6 months  Medical History:  Past Medical History:  Diagnosis Date  . Allergy   . Arthritis   . Asthma   . Benign hematuria 2004  . Depression   . Hyperlipidemia   . Hypertension   . TMJ (temporomandibular joint syndrome)   . Vitamin D deficiency    Allergies Allergies  Allergen Reactions  . Other Shortness Of Breath    Household bleach- "takes my breath away"  . Azithromycin Hives  . Celexa [Citalopram Hydrobromide]     Myalgias  . Trintellix [Vortioxetine] Other (See Comments)    Dysphoria   . Vicodin [Hydrocodone-Acetaminophen] Nausea And Vomiting  . Wellbutrin [Bupropion]     anxiety   SURGICAL HISTORY She  has a past surgical history that includes Tubal ligation; Breast surgery (Left); Knee arthroscopy w/ debridement (Right, 11/2017); Ankle surgery (Left, 2017); Total knee arthroplasty (Right, 02/2019); and Total abdominal hysterectomy (2003). FAMILY HISTORY Her family history includes Arthritis in her mother; Breast cancer in her sister; Cancer in her mother; Hypertension in her father; Stroke in her father. SOCIAL HISTORY She  reports that she has been smoking. She has a 12.50 pack-year smoking history. She has never used smokeless  tobacco. She reports current alcohol use. She reports current drug use. Drug: Marijuana.  Review of Systems  Constitutional: Negative for chills, diaphoresis, fever, malaise/fatigue and weight loss.  HENT: Positive for hearing loss. Negative for congestion, ear discharge, ear pain, nosebleeds, sinus pain, sore throat and tinnitus.   Eyes: Negative for blurred vision, double vision, photophobia, pain, discharge and redness.  Respiratory: Negative for cough, hemoptysis, sputum production, shortness of breath, wheezing and stridor.   Cardiovascular: Negative for chest pain, palpitations, orthopnea, claudication, leg swelling and PND.  Gastrointestinal: Negative for abdominal pain, blood in stool, constipation, diarrhea, heartburn, melena, nausea and vomiting.  Genitourinary: Negative for dysuria, flank pain, frequency, hematuria and urgency.  Musculoskeletal: Negative for back pain, falls, joint pain, myalgias and neck pain.  Skin: Negative for itching and rash.  Neurological: Negative for dizziness, tingling, tremors, sensory change, speech change, focal weakness, seizures, loss of consciousness, weakness and headaches.  Endo/Heme/Allergies: Negative for environmental allergies and polydipsia. Does not bruise/bleed easily.  Psychiatric/Behavioral: Negative for depression, hallucinations, memory loss, substance abuse and suicidal ideas. The patient is not nervous/anxious and does not have insomnia.    Physical Exam: Estimated body mass index is 31.62 kg/m as calculated from the following:   Height as of 01/26/20: 5\' 5"  (1.651 m).   Weight as of 01/26/20: 190 lb (86.2 kg). There were no vitals taken for this visit. General Appearance: Well nourished, in no apparent distress. Eyes: PERRLA, EOMs, conjunctiva no swelling or erythema, normal fundi and vessels. Sinuses: No Frontal/maxillary tenderness ENT/Mouth: Ext aud canals clear, normal light reflex with TMs without erythema, bulging. Bilateral  hearing aids. Good dentition. No erythema, swelling, or exudate on post pharynx. Tonsils not swollen or erythematous. Hearing normal. Crowded mouth.  Neck: Supple, thyroid normal. No bruits Respiratory: Respiratory effort normal, BS equal bilaterally without rales, rhonchi, wheezing or stridor. Cardio: RRR without murmurs, rubs or gallops. Brisk peripheral pulses without edema.  Chest: symmetric, with normal excursions and percussion. Breasts: Symmetric, without lumps, nipple discharge, or retractions. Abdomen: Soft, +BS. RUQ pain, tender bilateral lower AB, no guarding, rebound, hernias, masses, or organomegaly. .  Lymphatics: Non tender without lymphadenopathy.  Musculoskeletal: Full ROM all peripheral extremities,5/5 strength, and normal gait.  Skin: Warm, dry without rashes,  lesions, ecchymosis.  Actinic keratoses, left chest mid clavicular, 9'oclock left breast and right posterior shoulder.  Breast: Mammogram 05/2019, no concerns, no dimpling, obvious deformities, color, nipple abnormalities or discharge noted. Neuro: Cranial nerves intact, reflexes equal bilaterally. Normal muscle tone, no cerebellar symptoms. Sensation intact.  Psych: Awake and oriented X 3, normal affect, Insight and Judgment appropriate.                 Garnet Sierras, Laqueta Jean, DNP [facility] 05/02/2020  2:02 PM

## 2020-05-11 LAB — HM MAMMOGRAPHY

## 2020-05-29 DIAGNOSIS — Z1231 Encounter for screening mammogram for malignant neoplasm of breast: Secondary | ICD-10-CM | POA: Diagnosis not present

## 2020-05-29 DIAGNOSIS — Z780 Asymptomatic menopausal state: Secondary | ICD-10-CM | POA: Diagnosis not present

## 2020-05-29 DIAGNOSIS — M85851 Other specified disorders of bone density and structure, right thigh: Secondary | ICD-10-CM | POA: Diagnosis not present

## 2020-05-29 DIAGNOSIS — M85852 Other specified disorders of bone density and structure, left thigh: Secondary | ICD-10-CM | POA: Diagnosis not present

## 2020-05-29 LAB — HM DEXA SCAN

## 2020-06-09 ENCOUNTER — Encounter: Payer: Self-pay | Admitting: *Deleted

## 2020-06-12 ENCOUNTER — Encounter: Payer: Self-pay | Admitting: *Deleted

## 2020-08-23 ENCOUNTER — Other Ambulatory Visit: Payer: Self-pay

## 2020-08-23 ENCOUNTER — Encounter: Payer: Self-pay | Admitting: Adult Health

## 2020-08-23 ENCOUNTER — Ambulatory Visit (INDEPENDENT_AMBULATORY_CARE_PROVIDER_SITE_OTHER): Payer: PPO | Admitting: Adult Health

## 2020-08-23 VITALS — BP 122/72 | HR 62 | Temp 95.9°F | Ht 65.0 in | Wt 190.0 lb

## 2020-08-23 DIAGNOSIS — E785 Hyperlipidemia, unspecified: Secondary | ICD-10-CM

## 2020-08-23 DIAGNOSIS — J449 Chronic obstructive pulmonary disease, unspecified: Secondary | ICD-10-CM

## 2020-08-23 DIAGNOSIS — I10 Essential (primary) hypertension: Secondary | ICD-10-CM | POA: Diagnosis not present

## 2020-08-23 DIAGNOSIS — I2510 Atherosclerotic heart disease of native coronary artery without angina pectoris: Secondary | ICD-10-CM

## 2020-08-23 DIAGNOSIS — E669 Obesity, unspecified: Secondary | ICD-10-CM | POA: Diagnosis not present

## 2020-08-23 DIAGNOSIS — E66811 Obesity, class 1: Secondary | ICD-10-CM

## 2020-08-23 DIAGNOSIS — E559 Vitamin D deficiency, unspecified: Secondary | ICD-10-CM | POA: Diagnosis not present

## 2020-08-23 DIAGNOSIS — F411 Generalized anxiety disorder: Secondary | ICD-10-CM

## 2020-08-23 DIAGNOSIS — F17200 Nicotine dependence, unspecified, uncomplicated: Secondary | ICD-10-CM | POA: Diagnosis not present

## 2020-08-23 DIAGNOSIS — Z122 Encounter for screening for malignant neoplasm of respiratory organs: Secondary | ICD-10-CM

## 2020-08-23 NOTE — Progress Notes (Signed)
FOLLOW UP 6 MONTH  Assessment and Plan:   Essential hypertension Continue current medications Monitor blood pressure at home; call if consistently over 130/80 Continue DASH diet.   Reminder to go to the ER if any CP, SOB, nausea, dizziness, severe HA, changes vision/speech, left arm numbness and tingling and jaw pain. -     CBC with Differential/Platelet -     COMPLETE METABOLIC PANEL WITH GFR -     Magnesium  Hyperlipidemia, unspecified hyperlipidemia type Continue medications: rosuvastatin 10mg  LDL goal <70  Discussed dietary and exercise modifications Low fat diet -     Lipid panel  Recurrent major depressive disorder, in partial remission (HCC) Continue medications: Escitalopram 20mg  daily Discussed stress management techniques  Discussed, increase water,intake & good sleep hygiene  Discussed increasing exercise & vegetables in diet  Anxiety state Managing at this time Has Buspar PRN  Vitamin D deficiency Continue supplementation to maintain goal of 70-100 Taking Vitamin D 2,000 IU daily Defer vitamin D level  Abnormal glucose   Discussed dietary and exercise modifications  Smoker-  Discussed smoking cessation, benefits Discussed health risks of continued behavior Not ready to quit at this time Will continue to assess readiness  -lung cancer screening with low dose CT discussed as recommended by guidelines based on age, number of pack year history.  Discussed risks of screening including but not limited to false positives on xray, further testing or consultation with specialist, and possible false negative CT as well. Understanding expressed and wishes to proceed with CT testing. Order placed.   Chronic obstructive pulmonary disease, unspecified COPD type (HCC)/ asthma No triggers, well controlled symptoms, cont to monitor Continue Advair BID, rinse mouth after use STOP smoking  Mild intermittent asthma without complication Check with insurance about preferred  agent and can send in  STOP smoking/avoid trigger     Discussed med's effects and SE's. Screening labs and tests as requested with regular follow-up as recommended. Future Appointments  Date Time Provider [place]  01/25/2021  2:00 PM McClanahan, Danton Sewer, NP [facility] None    HPI 60 y.o. female  presents for 3 month follow up for HTN, HLD, anxiety, COPD, smoker, Vitamin d defciency and weight.  She is a smoker (22.5 pack year history), Has COPD/asthma and uses Advair and albuterol. She had normal CXR 04/2020. Insurance changed and won't cover advair, will check for preferred agent. Discussed low dose CT due to pack year history, would like to proceed.   She was seen last visit for multitude of symptoms, including nausea/AB pain, had negative AB US/negative HIDA, saw Dr. Tarri Glenn previously. Improved Has since stopped PPI.  She is using Famotidine daily.   BMI is Body mass index is 31.62 kg/m., she is working on diet and exercise.  Wt Readings from Last 3 Encounters:  08/23/20 190 lb (86.2 kg)  05/02/20 192 lb (87.1 kg)  01/26/20 190 lb (86.2 kg)   Her blood pressure has been controlled at home, today their BP is BP: 122/72 She does workout, walks daily.  She denies chest pain, shortness of breath, dizziness.   She had Ct coronary 02/2018 that showed mild disease, medically treated.     She is on cholesterol medication (crestor 10 mg daily), started previously and denies myalgias. Her cholesterol is at goal. The cholesterol last visit was:   Lab Results  Component Value Date   CHOL 151 05/02/2020   HDL 57 05/02/2020   LDLCALC 68 05/02/2020   TRIG 179 (H) 05/02/2020  CHOLHDL 2.6 05/02/2020   Last A1C in the office was:  Lab Results  Component Value Date   HGBA1C 5.3 01/26/2020   Patient is on Vitamin D supplement, 5000 IU   Lab Results  Component Value Date   VD25OH 37 01/26/2020    Current Medications:    Current Outpatient Medications (Cardiovascular):     diltiazem (CARDIZEM CD) 120 MG 24 hr capsule, Take      1 capsule       Daily       for BP   rosuvastatin (CRESTOR) 10 MG tablet, Take      1 tablet       Daily        for Cholesterol  Current Outpatient Medications (Respiratory):    albuterol (VENTOLIN HFA) 108 (90 Base) MCG/ACT inhaler, USE 2 PUFFS EVERY 6 HOURS  AS NEEDED FOR SHORTNESS OF  BREATH   Fluticasone-Salmeterol (ADVAIR DISKUS) 100-50 MCG/DOSE AEPB, Inhale 1 puff into the lungs 2 (two) times daily.   azelastine (ASTELIN) 0.1 % nasal spray, Place 2 sprays into both nostrils 2 (two) times daily. Use in each nostril as directed   fluticasone (FLONASE) 50 MCG/ACT nasal spray, Place 1 spray into both nostrils daily. (Patient not taking: Reported on 08/23/2020)    Current Outpatient Medications (Other):    busPIRone (BUSPAR) 15 MG tablet, Take on tablet three times a day for chronic anxiety.   cholecalciferol (VITAMIN D) 1000 UNITS tablet, Take 2,000 Units by mouth daily.    escitalopram (LEXAPRO) 20 MG tablet, Take     1 tablet     Daily      for Mood   famotidine (PEPCID) 20 MG tablet, Take one tablet daily for reflux.   Multiple Vitamin (MULTIVITAMIN) capsule, Take 1 capsule by mouth daily.   omeprazole (PRILOSEC) 40 MG capsule, Take      1 capsule     Daily         to prevent Heartburn & Indigestion (Patient not taking: Reported on 08/23/2020)   Health Maintenance:   Immunization History  Administered Date(s) Administered   Influenza Inj Mdck Quad Pf 12/13/2017   Influenza,inj,Quad PF,6+ Mos 12/18/2018   Moderna Sars-Covid-2 Vaccination 02/20/2020   PFIZER Comirnaty(Gray Top)Covid-19 Tri-Sucrose Vaccine 05/27/2019, 06/17/2019   Pneumococcal-Unspecified 04/03/2009   Td 04/03/2004   TDAP: 09/2013 at pharmacy Pneumovax: 2011 Prevnar 13: due at age 66 Flu vaccine: 2020 at [facility]  Zostavax: discussed, will get  Pap: Total abdominal hysterectomy 2003 MGM:05/24/2019 DEXA: 09/2013 normal, DUE Colonoscopy: 08/2017 polyps due 5  years EGD: N/A CXR 12/2017  US pelvis 2011 DEE Dr. Sherlean Foot DUE Dentist: Barney Drain, q 6 months  Medical History:  Past Medical History:  Diagnosis Date   Allergy    Arthritis    Asthma    Benign hematuria 2004   Depression    Hyperlipidemia    Hypertension    TMJ (temporomandibular joint syndrome)    Vitamin D deficiency    Allergies Allergies  Allergen Reactions   Other Shortness Of Breath    Household bleach- "takes my breath away"   Azithromycin Hives   Celexa [Citalopram Hydrobromide]     Myalgias   Trintellix [Vortioxetine] Other (See Comments)    Dysphoria    Vicodin [Hydrocodone-Acetaminophen] Nausea And Vomiting   Wellbutrin [Bupropion]     anxiety   SURGICAL HISTORY She  has a past surgical history that includes Tubal ligation; Breast surgery (Left); Knee arthroscopy w/ debridement (Right, 11/2017); Ankle surgery (Left, 2017);  Total knee arthroplasty (Right, 02/2019); and Total abdominal hysterectomy (2003). FAMILY HISTORY Her family history includes Arthritis in her mother; Breast cancer in her sister; Cancer in her mother; Hypertension in her father; Stroke in her father. SOCIAL HISTORY She  reports that she has been smoking cigarettes. She has a 12.50 pack-year smoking history. She has never used smokeless tobacco. She reports current alcohol use. She reports current drug use. Drug: Marijuana.  Review of Systems  Constitutional:  Negative for malaise/fatigue and weight loss.  HENT:  Negative for hearing loss and tinnitus.   Eyes:  Negative for blurred vision and double vision.  Respiratory:  Negative for cough, shortness of breath and wheezing.   Cardiovascular:  Negative for chest pain, palpitations, orthopnea, claudication and leg swelling.  Gastrointestinal:  Negative for abdominal pain, blood in stool, constipation, diarrhea, heartburn, melena, nausea and vomiting.  Genitourinary: Negative.   Musculoskeletal:  Negative for joint pain and myalgias.   Skin:  Negative for rash.  Neurological:  Negative for dizziness, tingling, sensory change, weakness and headaches.  Endo/Heme/Allergies:  Negative for polydipsia.  Psychiatric/Behavioral: Negative.    All other systems reviewed and are negative. Physical Exam: Estimated body mass index is 31.62 kg/m as calculated from the following:   Height as of this encounter: 5\' 5"  (1.651 m).   Weight as of this encounter: 190 lb (86.2 kg). BP 122/72   Pulse 62   Temp (!) 95.9 F (35.5 C)   Ht 5\' 5"  (1.651 m)   Wt 190 lb (86.2 kg)   SpO2 96%   BMI 31.62 kg/m  General Appearance: Well nourished, in no apparent distress. Eyes: PERRLA, EOMs, conjunctiva no swelling or erythema, normal fundi and vessels. Sinuses: No Frontal/maxillary tenderness ENT/Mouth: Ext aud canals clear, normal light reflex with TMs without erythema, bulging. Bilateral hearing aids. Good dentition. No erythema, swelling, or exudate on post pharynx. Tonsils not swollen or erythematous. Hearing normal. Crowded mouth.  Neck: Supple, thyroid normal. No bruits Respiratory: Respiratory effort normal, BS equal bilaterally without rales, rhonchi, wheezing or stridor. Cardio: RRR without murmurs, rubs or gallops. Brisk peripheral pulses without edema.  Chest: symmetric, with normal excursions and percussion. Breasts: Symmetric, without lumps, nipple discharge, or retractions. Abdomen: Soft, +BS. RUQ pain, tender bilateral lower AB, no guarding, rebound, hernias, masses, or organomegaly. .  Lymphatics: Non tender without lymphadenopathy.  Musculoskeletal: Full ROM all peripheral extremities,5/5 strength, and normal gait.  Skin: Warm, dry without rashes, lesions, ecchymosis.  Actinic keratoses, left chest mid clavicular, 9'oclock left breast and right posterior shoulder.  Breast: Mammogram 05/2019, no concerns, no dimpling, obvious deformities, color, nipple abnormalities or discharge noted. Neuro: Cranial nerves intact, reflexes equal  bilaterally. Normal muscle tone, no cerebellar symptoms. Sensation intact.  Psych: Awake and oriented X 3, normal affect, Insight and Judgment appropriate.                 Garnet Sierras, Laqueta Jean, DNP [facility] 08/23/2020  3:03 PM

## 2020-08-23 NOTE — Patient Instructions (Addendum)
Call insurance - check formulary for preferred inhaler     Lung Cancer Screening A lung cancer screening is a test that checks for lung cancer. Lung cancer screening is done to look for lung cancer in its very early stages when you are not likely to have any symptoms and before it spreads beyond the lung, making it harder to treat. Finding cancer early improves the chances of successfultreatment. It may save your life. Who should have screening? You should be screened for lung cancer if all of these apply: You currently smoke or you have quit smoking within the past 15 years. You are 108-87 years old. Screening may be recommended up to age 5 depending on your overall health and other factors. You are in good general health. You have a smoking history of 1 pack of cigarettes a day for 20 years or 2 packs a day for 10 years. Screening may also be recommended if you are at high risk for the disease. You may be at high risk if: You have a family history of lung cancer. You have been exposed to asbestos or radon. You have chronic obstructive pulmonary disease (COPD). How is screening done?  The recommended screening test is a low-dose computed tomography (LDCT) scan. This scan takes detailed images of the lungs. This allows a health care provider to look for abnormal cells. If you are at risk for lung cancer, it is recommended that you get screened once a year. Talk to your health careprovider about the risks, benefits, and limitations of screening. What are the benefits of screening? Screening can find lung cancer early, before symptoms start and before it has spread outside of the lungs. The chances of curing lung cancer are greater ifthe cancer is diagnosed early. What are the risks of screening? The screening may show lung cancer when no cancer is present (false-positive result). The screening may not find lung cancer when it is present. The person gets exposed to radiation. How can I  lower my risk of lung cancer? Make these lifestyle changes to lower your risk of developing lung cancer: Do not use any products that contain nicotine or tobacco, such as cigarettes, e-cigarettes, and chewing tobacco. If you need help quitting, ask your health care provider. Avoid secondhand smoke. Avoid exposure to radiation. Avoid exposure to radon gas. Have your home checked for radon regularly. Avoid things that cause cancer (carcinogens). Avoid living or working in places with high air pollution. Questions to ask your health care provider Am I eligible for lung cancer screening? Does my health insurance cover the cost of lung cancer screening? What happens if the lung cancer screening shows something of concern? How soon will I have results from my lung cancer screening? Is there anything that I need to do to prepare for my lung cancer screening? What happens if I decide not to have lung cancer screening? Where to find more information Ask your health care provider about the risks and benefits of screening. More information and resources are available from these organizations: [place] (ACS): www.cancer.org American Lung Association: www.lung.org Contact a health care provider if: You start to show symptoms of lung cancer, including: Coughing that will not go away. Making whistling sounds when you breathe (wheezing). Chest pain. Coughing up blood. Shortness of breath. Weight loss that cannot be explained. Constant tiredness (fatigue). Hoarse voice. Summary Lung cancer screening may find lung cancer before symptoms appear. Finding cancer early improves the chances of successful treatment. It may  save your life. The recommended screening test is a low-dose computed tomography (LDCT) scan that looks for abnormal cells in the lungs. If you are at risk for lung cancer, it is recommended that you get screened once a year. You can make lifestyle changes to lower your risk  of lung cancer. Ask your health care provider about the risks and benefits of screening. This information is not intended to replace advice given to you by your health care provider. Make sure you discuss any questions you have with your healthcare provider. Document Revised: 02/24/2020 Document Reviewed: 02/23/2019 Elsevier Patient Education  2022 [employer].

## 2020-08-24 LAB — LIPID PANEL
Cholesterol: 145 mg/dL (ref <200)
HDL: 53 mg/dL (ref >=50)
LDL Cholesterol (Calc): 66 mg/dL (calc)
Non-HDL Cholesterol (Calc): 92 mg/dL (calc) (ref <130)
Total CHOL/HDL Ratio: 2.7 (calc) (ref <5.0)
Triglycerides: 182 mg/dL — ABNORMAL HIGH (ref <150)

## 2020-08-24 LAB — COMPLETE METABOLIC PANEL WITH GFR
AG Ratio: 2 (calc) (ref 1.0–2.5)
ALT: 12 U/L (ref 6–29)
AST: 13 U/L (ref 10–35)
Albumin: 4.5 g/dL (ref 3.6–5.1)
Alkaline phosphatase (APISO): 89 U/L (ref 37–153)
BUN: 13 mg/dL (ref 7–25)
CO2: 26 mmol/L (ref 20–32)
Calcium: 10 mg/dL (ref 8.6–10.4)
Chloride: 104 mmol/L (ref 98–110)
Creat: 0.98 mg/dL (ref 0.50–1.05)
GFR, Est African American: 73 mL/min/{1.73_m2} (ref >=60)
GFR, Est Non African American: 63 mL/min/{1.73_m2} (ref >=60)
Globulin: 2.3 g/dL (calc) (ref 1.9–3.7)
Glucose, Bld: 87 mg/dL (ref 65–99)
Potassium: 4.1 mmol/L (ref 3.5–5.3)
Sodium: 139 mmol/L (ref 135–146)
Total Bilirubin: 0.5 mg/dL (ref 0.2–1.2)
Total Protein: 6.8 g/dL (ref 6.1–8.1)

## 2020-08-24 LAB — CBC WITH DIFFERENTIAL/PLATELET
Absolute Monocytes: 748 cells/uL (ref 200–950)
Basophils Absolute: 43 cells/uL (ref 0–200)
Basophils Relative: 0.5 %
Eosinophils Absolute: 162 cells/uL (ref 15–500)
Eosinophils Relative: 1.9 %
HCT: 43 % (ref 35.0–45.0)
Hemoglobin: 14.6 g/dL (ref 11.7–15.5)
Lymphs Abs: 2669 cells/uL (ref 850–3900)
MCH: 31.4 pg (ref 27.0–33.0)
MCHC: 34 g/dL (ref 32.0–36.0)
MCV: 92.5 fL (ref 80.0–100.0)
MPV: 11 fL (ref 7.5–12.5)
Monocytes Relative: 8.8 %
Neutro Abs: 4879 cells/uL (ref 1500–7800)
Neutrophils Relative %: 57.4 %
Platelets: 294 10*3/uL (ref 140–400)
RBC: 4.65 10*6/uL (ref 3.80–5.10)
RDW: 12.3 % (ref 11.0–15.0)
Total Lymphocyte: 31.4 %
WBC: 8.5 10*3/uL (ref 3.8–10.8)

## 2020-08-24 LAB — TSH: TSH: 2.37 mIU/L (ref 0.40–4.50)

## 2020-08-24 LAB — MAGNESIUM: Magnesium: 1.8 mg/dL (ref 1.5–2.5)

## 2020-09-08 DIAGNOSIS — M25561 Pain in right knee: Secondary | ICD-10-CM | POA: Diagnosis not present

## 2020-09-08 DIAGNOSIS — Z96651 Presence of right artificial knee joint: Secondary | ICD-10-CM | POA: Diagnosis not present

## 2020-09-13 ENCOUNTER — Other Ambulatory Visit: Payer: Self-pay

## 2020-09-13 ENCOUNTER — Ambulatory Visit
Admission: RE | Admit: 2020-09-13 | Discharge: 2020-09-13 | Disposition: A | Discharge status: Home / Self Care | Payer: BC Managed Care – PPO | Source: Ambulatory Visit | Attending: Adult Health | Admitting: Adult Health

## 2020-09-13 DIAGNOSIS — F17210 Nicotine dependence, cigarettes, uncomplicated: Secondary | ICD-10-CM | POA: Diagnosis not present

## 2020-09-13 DIAGNOSIS — Z122 Encounter for screening for malignant neoplasm of respiratory organs: Secondary | ICD-10-CM

## 2020-09-13 DIAGNOSIS — F17200 Nicotine dependence, unspecified, uncomplicated: Secondary | ICD-10-CM

## 2020-09-14 ENCOUNTER — Encounter: Payer: Self-pay | Admitting: Adult Health

## 2020-09-14 DIAGNOSIS — I700 Atherosclerosis of aorta: Secondary | ICD-10-CM | POA: Insufficient documentation

## 2020-11-21 ENCOUNTER — Other Ambulatory Visit: Payer: Self-pay | Admitting: Adult Health Nurse Practitioner

## 2020-11-21 DIAGNOSIS — K219 Gastro-esophageal reflux disease without esophagitis: Secondary | ICD-10-CM

## 2020-12-29 DIAGNOSIS — H02834 Dermatochalasis of left upper eyelid: Secondary | ICD-10-CM | POA: Diagnosis not present

## 2020-12-29 DIAGNOSIS — H0279 Other degenerative disorders of eyelid and periocular area: Secondary | ICD-10-CM | POA: Diagnosis not present

## 2020-12-29 DIAGNOSIS — H02413 Mechanical ptosis of bilateral eyelids: Secondary | ICD-10-CM | POA: Diagnosis not present

## 2020-12-29 DIAGNOSIS — H02831 Dermatochalasis of right upper eyelid: Secondary | ICD-10-CM | POA: Diagnosis not present

## 2020-12-29 DIAGNOSIS — H04123 Dry eye syndrome of bilateral lacrimal glands: Secondary | ICD-10-CM | POA: Diagnosis not present

## 2020-12-29 DIAGNOSIS — H02423 Myogenic ptosis of bilateral eyelids: Secondary | ICD-10-CM | POA: Diagnosis not present

## 2020-12-29 DIAGNOSIS — H57813 Brow ptosis, bilateral: Secondary | ICD-10-CM | POA: Diagnosis not present

## 2020-12-29 DIAGNOSIS — H53483 Generalized contraction of visual field, bilateral: Secondary | ICD-10-CM | POA: Diagnosis not present

## 2021-01-09 DIAGNOSIS — H53483 Generalized contraction of visual field, bilateral: Secondary | ICD-10-CM | POA: Diagnosis not present

## 2021-01-24 DIAGNOSIS — M85851 Other specified disorders of bone density and structure, right thigh: Secondary | ICD-10-CM | POA: Insufficient documentation

## 2021-01-24 DIAGNOSIS — L03115 Cellulitis of right lower limb: Secondary | ICD-10-CM | POA: Diagnosis not present

## 2021-01-24 DIAGNOSIS — R87610 Atypical squamous cells of undetermined significance on cytologic smear of cervix (ASC-US): Secondary | ICD-10-CM | POA: Insufficient documentation

## 2021-01-24 NOTE — Progress Notes (Signed)
COMPLETE PHYSICAL  Assessment and Plan:  Nicole Phelps was seen today for annual exam.  Diagnoses and all orders for this visit:  Encounter for Annual Physical Exam with abnormal findings Due annually  Health Maintenance reviewed Healthy lifestyle reviewed and goals set  Essential hypertension Continue current medications Monitor blood pressure at home; call if consistently over 130/80 Continue DASH diet.   Reminder to go to the ER if any CP, SOB, nausea, dizziness, severe HA, changes vision/speech, left arm numbness and tingling and jaw pain. -     CBC with Differential/Platelet -     COMPLETE METABOLIC PANEL WITH GFR -     Magnesium -     EKG   Hyperlipidemia, unspecified hyperlipidemia type Continue medications: rosuvastatin 10mg  Discussed dietary and exercise modifications Low fat diet -     Lipid panel  Recurrent major depressive disorder, in partial remission (HCC) Continue medications: Escitalopram 20mg  daily Discussed stress management techniques  Discussed, increase water,intake & good sleep hygiene  Discussed increasing exercise & vegetables in diet  Anxiety state Managing well at this time Stress management techniques discussed, increase water, good sleep hygiene discussed, increase exercise, and increase veggies.   Chronic obstructive pulmonary disease, unspecified COPD type ([place]) Mild intermittent asthma without complication No triggers, well controlled symptoms, cont to monitor STOP SMOKING Continue Advair BID, rinse mouth after use  Smoker Discussed smoking cessation, benefits Discussed health risks of continued behavior Not ready to quit at this time Will continue to assess readiness  -     UTD annual screening, last 09/2020, repeat 1 year  B12 deficiency -     Vitamin B12  Vitamin D deficiency Continue supplementation to maintain goal of 70-100 Taking Vitamin D 2,000 IU daily Defer vitamin D level -     VITAMIN D 25 Hydroxy (Vit-D Deficiency,  Fractures)  Abnormal glucose -     Hemoglobin A1c   Discussed dietary and exercise modifications  Hepatic steatosis Per CT 02/12/20  Severe obesity (BMI 35.0-39.9) with comorbidity ([place]) Discussed dietary and exercise modifications  Screening for thyroid disorder -     TSH  Screening for blood or protein in urine -     Urinalysis w microscopic + reflex cultur -     Microalbumin / creatinine urine ratio  Screening, ischemic heart disease      -      EKG   Abnormal vaginal PAP/ ASCUS - PAP + HPV, if similar try topical estrogen and repeat 6 months - if remains persistent will refer to GYN   R foot cellulitis - continue keflex as prescribed, follow up tomorrow, if not improving switch to doxycycline  Need for tetanus booster - Td administered without complication.    Orders Placed This Encounter  Procedures   Td : Tetanus/diphtheria >7yo Preservative  free   CBC with Differential/Platelet   COMPLETE METABOLIC PANEL WITH GFR   Magnesium   Lipid panel   TSH   Hemoglobin A1c   VITAMIN D 25 Hydroxy (Vit-D Deficiency, Fractures)   Microalbumin / creatinine urine ratio   Urinalysis, Routine w reflex microscopic   Vitamin B12   EKG 12-Lead    Discussed med's effects and SE's. Screening labs and tests as requested with regular follow-up as recommended. Future Appointments  Date Time Provider [place]  07/25/2021 11:00 AM Liane Comber, NP [facility] None  01/29/2022  2:00 PM Liane Comber, NP [facility] None    HPI 59 y.o. female  presents for a complete physical. She has Hypertension;  Hyperlipidemia; Depression; Vitamin D deficiency; Obesity (BMI 30.0-34.9); Smoker; Asthma; Benign hematuria; B12 deficiency; Chronic obstructive pulmonary disease ([place]); Bruises easily; Mild CAD; Anxiety state; Aortic atherosclerosis ([place]) - CT 09/2020; ASCUS of cervix with negative high risk HPV; and Osteopenia of necks of both femurs on their problem list.  She is married, 2  children, 9 grandgrands. She is retired Engineer, water.   She reports dropped rusty scissors of R foot a few days ago, red and swollen, saw ortho and started on keflex yesterday, no improvement thus far, has follow up planned tomorrow.   She is a smoker (22.5 pack year history), Has COPD/asthma and uses Advair and albuterol. She had normal CXR 04/2020. Insurance changed and won't cover advair, will check for preferred agent. She had low dose lung cancer screening 09/13/2020 that was benign, recommended annual follow up.   She was seen last visit for multitude of symptoms, including nausea/AB pain, had negative AB US/negative HIDA, saw Dr. Tarri Glenn, started PPI course and resolved. Now transitioned to famotidine and reports continues to do well.   BMI is Body mass index is 32.47 kg/m., she admits not doing too well on diet She does walk daily. Will do frozen veggie. Drinks diet mountain dew, 3 cans over ice. 3 full bottles of water.  Wt Readings from Last 3 Encounters:  01/25/21 193 lb 9.6 oz (87.8 kg)  08/23/20 190 lb (86.2 kg)  05/02/20 192 lb (87.1 kg)   Her blood pressure has been controlled at home, today their BP is BP: 120/76 She does workout, walks daily.  She denies chest pain, shortness of breath, dizziness.   She had Ct coronary 02/2018 that showed mild disease, medically treated.  Aortic atherosclerosis per CT 09/2020.   She is on cholesterol medication, rosuvastatin 10 mg daily and denies myalgias. Her cholesterol is at goal. The cholesterol last visit was:   Lab Results  Component Value Date   CHOL 145 08/23/2020   HDL 53 08/23/2020   LDLCALC 66 08/23/2020   TRIG 182 (H) 08/23/2020   CHOLHDL 2.7 08/23/2020   Last A1C in the office was:  Lab Results  Component Value Date   HGBA1C 5.3 01/26/2020    Lab Results  Component Value Date   GFRNONAA 63 08/23/2020   B12 was  Lab Results  Component Value Date   RJJOACZY60 630 01/26/2020   Patient is on Vitamin D supplement,  5000IU   Lab Results  Component Value Date   VD25OH 37 01/26/2020    Current Medications:    Current Outpatient Medications (Cardiovascular):    diltiazem (CARDIZEM CD) 120 MG 24 hr capsule, Take 1 capsule Daily for BP   rosuvastatin (CRESTOR) 10 MG tablet, Take 1 tablet Daily for Cholesterol  Current Outpatient Medications (Respiratory):    fluticasone-salmeterol (ADVAIR) 100-50 MCG/ACT AEPB, Inhale 1 puff into the lungs 2 (two) times daily. Rinse mouth and gargle after each use to avoid thrush.   albuterol (VENTOLIN HFA) 108 (90 Base) MCG/ACT inhaler, USE 2 PUFFS EVERY 6 HOURS  AS NEEDED FOR SHORTNESS OF  BREATH   azelastine (ASTELIN) 0.1 % nasal spray, Place 2 sprays into both nostrils 2 (two) times daily. Use in each nostril as directed   fluticasone (FLONASE) 50 MCG/ACT nasal spray, Place 1 spray into both nostrils daily. (Patient not taking: Reported on 08/23/2020)    Current Outpatient Medications (Other):    cholecalciferol (VITAMIN D) 1000 UNITS tablet, Take 2,000 Units by mouth daily.    Multiple Vitamin (MULTIVITAMIN)  capsule, Take 1 capsule by mouth daily.   busPIRone (BUSPAR) 15 MG tablet, Take on tablet three times a day for chronic anxiety.   escitalopram (LEXAPRO) 20 MG tablet, Take 1 tablet Daily for Mood   famotidine (PEPCID) 20 MG tablet, TAKE 1 TABLET BY MOUTH TWICE A DAY (Patient not taking: Reported on 01/25/2021)   Health Maintenance:   Immunization History  Administered Date(s) Administered   Influenza Inj Mdck Quad Pf 12/13/2017   Influenza,inj,Quad PF,6+ Mos 12/18/2018   Influenza-Unspecified 12/30/2020   Moderna Sars-Covid-2 Vaccination 02/20/2020   PFIZER Comirnaty(Gray Top)Covid-19 Tri-Sucrose Vaccine 05/27/2019, 06/17/2019   PFIZER(Purple Top)SARS-COV-2 Vaccination 10/22/2020   Pneumococcal-Unspecified 04/03/2009   Td 04/03/2004, 01/25/2021   TDAP: 09/2013 at pharmacy, TODAY DUE TO rusty scissors injury  Pneumovax: 2011 Prevnar 20: due at age  89 Flu vaccine: 2022 Shingrix: has had, pending dates for record  Covid19: 2/2, pfizer + 2 boosters  Pap: Total abdominal hysterectomy, PAP 2020 with ASCUS was recommended 1 year follow up OVERDUE TODAY  MGM: 05/11/2020 DEXA: 05/2020, T - 1.4 bil fem neck  Colonoscopy: 08/2017 polyps due 5 years EGD: N/A  Eye exam: Eye care center in [place], last 2022, glasses Dentist: Dr. Barney Drain, 2022 q 6 months Derm: remote  Medical History:  Past Medical History:  Diagnosis Date   Allergy    Arthritis    Asthma    Benign hematuria 2004   Depression    Hyperlipidemia    Hypertension    TMJ (temporomandibular joint syndrome)    Vitamin D deficiency    Allergies Allergies  Allergen Reactions   Other Shortness Of Breath    Household bleach- "takes my breath away"   Azithromycin Hives   Celexa [Citalopram Hydrobromide]     Myalgias   Trintellix [Vortioxetine] Other (See Comments)    Dysphoria    Vicodin [Hydrocodone-Acetaminophen] Nausea And Vomiting   Wellbutrin [Bupropion]     anxiety   SURGICAL HISTORY She  has a past surgical history that includes Tubal ligation; Breast surgery (Left); Knee arthroscopy w/ debridement (Right, 11/2017); Ankle surgery (Left, 2017); Total knee arthroplasty (Right, 02/2019); and Total abdominal hysterectomy (2003). FAMILY HISTORY Her family history includes Arthritis in her mother; Breast cancer (age of onset: 65) in her sister; Colon cancer in her maternal aunt, maternal aunt, and maternal grandmother; Colon cancer (age of onset: 64) in her mother; Heart attack in her paternal grandfather; Hypertension in her father; Stroke in her father. SOCIAL HISTORY She  reports that she has been smoking cigarettes. She started smoking about 45 years ago. She has a 22.50 pack-year smoking history. She has never used smokeless tobacco. She reports current alcohol use. She reports current drug use. Drug: Marijuana.  Review of Systems  Constitutional:   Negative for malaise/fatigue and weight loss.  HENT:  Negative for hearing loss and tinnitus.   Eyes:  Negative for blurred vision and double vision.  Respiratory:  Negative for cough, sputum production, shortness of breath and wheezing.   Cardiovascular:  Negative for chest pain, palpitations, orthopnea, claudication, leg swelling and PND.  Gastrointestinal:  Negative for abdominal pain, blood in stool, constipation, diarrhea, heartburn, melena, nausea and vomiting.  Genitourinary: Negative.   Musculoskeletal:  Negative for falls, joint pain and myalgias.  Skin:  Negative for rash.  Neurological:  Negative for dizziness, tingling, sensory change, weakness and headaches.  Endo/Heme/Allergies:  Negative for polydipsia.  Psychiatric/Behavioral: Negative.  Negative for depression, memory loss, substance abuse and suicidal ideas. The patient is not  nervous/anxious and does not have insomnia.   All other systems reviewed and are negative.  Physical Exam: Estimated body mass index is 32.47 kg/m as calculated from the following:   Height as of this encounter: 5' 4.75" (1.645 m).   Weight as of this encounter: 193 lb 9.6 oz (87.8 kg). BP 120/76   Pulse 67   Temp 97.9 F (36.6 C)   Ht 5' 4.75" (1.645 m)   Wt 193 lb 9.6 oz (87.8 kg)   SpO2 99%   BMI 32.47 kg/m  General Appearance: Well nourished, in no apparent distress. Eyes: PERRLA, EOMs, conjunctiva no swelling or erythema Sinuses: No Frontal/maxillary tenderness ENT/Mouth: Ext aud canals clear, normal light reflex with TMs without erythema, bulging. Bilateral hearing aids. Good dentition. No erythema, swelling, or exudate on post pharynx. Tonsils not swollen or erythematous. Hearing normal. Crowded mouth.  Neck: Supple, thyroid normal. No bruits Respiratory: Respiratory effort normal, BS equal bilaterally without rales, rhonchi, wheezing or stridor. Cardio: RRR without murmurs, rubs or gallops. Brisk peripheral pulses without edema.   Chest: symmetric, with normal excursions and percussion. Breasts: Defer, getting mammogram Abdomen: Soft, +BS. Non-tender, no guarding, rebound, hernias, masses, or organomegaly. .  Lymphatics: Non tender without lymphadenopathy.  Musculoskeletal: Full ROM all peripheral extremities,5/5 strength, and normal gait.  Skin: Warm, dry without rashes, lesions, ecchymosis.  R foot with 2 scabbed wounds dorsally, generalized mild swelling and pink/warm without distinct borders.   Breast: defer, mammogram annually, dense beasts, no new concerns Neuro: Cranial nerves intact, reflexes equal bilaterally. Normal muscle tone, no cerebellar symptoms. Sensation intact.  Psych: Awake and oriented X 3, normal affect, Insight and Judgment appropriate.  Pelvic: External genitalia:  no lesions              Urethra:  normal appearing urethra with no masses, tenderness or lesions              Bartholin's and Skene's: normal                 Vagina: atrophic vagina with normal color no lesions. Thick white discharge noted.              Cervix: Not present               Pap taken: vaginal wall specimen with brush Bimanual Exam: Adnexa: normal adnexa and no mass, fullness, tenderness, bilaterally.  No cervix palpated. Rectal exam: negative without mass, lesions or tenderness, sphincter tone normal, stool guaiac negative.           EKG: WNL no ST changes.    Izora Ribas, NP 5:19 PM [facility]

## 2021-01-25 ENCOUNTER — Ambulatory Visit (INDEPENDENT_AMBULATORY_CARE_PROVIDER_SITE_OTHER): Payer: PPO | Admitting: Adult Health

## 2021-01-25 ENCOUNTER — Other Ambulatory Visit: Payer: Self-pay

## 2021-01-25 ENCOUNTER — Encounter: Payer: Self-pay | Admitting: Adult Health

## 2021-01-25 ENCOUNTER — Encounter: Payer: BC Managed Care – PPO | Admitting: Adult Health Nurse Practitioner

## 2021-01-25 VITALS — BP 120/76 | HR 67 | Temp 97.9°F | Ht 64.75 in | Wt 193.6 lb

## 2021-01-25 DIAGNOSIS — R87620 Atypical squamous cells of undetermined significance on cytologic smear of vagina (ASC-US): Secondary | ICD-10-CM | POA: Diagnosis not present

## 2021-01-25 DIAGNOSIS — M85852 Other specified disorders of bone density and structure, left thigh: Secondary | ICD-10-CM

## 2021-01-25 DIAGNOSIS — I2510 Atherosclerotic heart disease of native coronary artery without angina pectoris: Secondary | ICD-10-CM | POA: Diagnosis not present

## 2021-01-25 DIAGNOSIS — R7309 Other abnormal glucose: Secondary | ICD-10-CM

## 2021-01-25 DIAGNOSIS — E669 Obesity, unspecified: Secondary | ICD-10-CM | POA: Diagnosis not present

## 2021-01-25 DIAGNOSIS — I700 Atherosclerosis of aorta: Secondary | ICD-10-CM | POA: Diagnosis not present

## 2021-01-25 DIAGNOSIS — Z136 Encounter for screening for cardiovascular disorders: Secondary | ICD-10-CM | POA: Diagnosis not present

## 2021-01-25 DIAGNOSIS — Z1272 Encounter for screening for malignant neoplasm of vagina: Secondary | ICD-10-CM

## 2021-01-25 DIAGNOSIS — Z0001 Encounter for general adult medical examination with abnormal findings: Secondary | ICD-10-CM

## 2021-01-25 DIAGNOSIS — L03115 Cellulitis of right lower limb: Secondary | ICD-10-CM

## 2021-01-25 DIAGNOSIS — Z23 Encounter for immunization: Secondary | ICD-10-CM

## 2021-01-25 DIAGNOSIS — E538 Deficiency of other specified B group vitamins: Secondary | ICD-10-CM

## 2021-01-25 DIAGNOSIS — Z0000 Encounter for general adult medical examination without abnormal findings: Secondary | ICD-10-CM

## 2021-01-25 DIAGNOSIS — J4520 Mild intermittent asthma, uncomplicated: Secondary | ICD-10-CM

## 2021-01-25 DIAGNOSIS — R0602 Shortness of breath: Secondary | ICD-10-CM

## 2021-01-25 DIAGNOSIS — I10 Essential (primary) hypertension: Secondary | ICD-10-CM | POA: Diagnosis not present

## 2021-01-25 DIAGNOSIS — J449 Chronic obstructive pulmonary disease, unspecified: Secondary | ICD-10-CM

## 2021-01-25 DIAGNOSIS — E559 Vitamin D deficiency, unspecified: Secondary | ICD-10-CM | POA: Diagnosis not present

## 2021-01-25 DIAGNOSIS — F411 Generalized anxiety disorder: Secondary | ICD-10-CM

## 2021-01-25 DIAGNOSIS — Z131 Encounter for screening for diabetes mellitus: Secondary | ICD-10-CM | POA: Diagnosis not present

## 2021-01-25 DIAGNOSIS — M85851 Other specified disorders of bone density and structure, right thigh: Secondary | ICD-10-CM

## 2021-01-25 DIAGNOSIS — N029 Recurrent and persistent hematuria with unspecified morphologic changes: Secondary | ICD-10-CM | POA: Diagnosis not present

## 2021-01-25 DIAGNOSIS — F3341 Major depressive disorder, recurrent, in partial remission: Secondary | ICD-10-CM

## 2021-01-25 DIAGNOSIS — E785 Hyperlipidemia, unspecified: Secondary | ICD-10-CM | POA: Diagnosis not present

## 2021-01-25 DIAGNOSIS — J45909 Unspecified asthma, uncomplicated: Secondary | ICD-10-CM

## 2021-01-25 DIAGNOSIS — R87629 Unspecified abnormal cytological findings in specimens from vagina: Secondary | ICD-10-CM | POA: Diagnosis not present

## 2021-01-25 DIAGNOSIS — F17200 Nicotine dependence, unspecified, uncomplicated: Secondary | ICD-10-CM

## 2021-01-25 MED ORDER — BUSPIRONE HCL 15 MG PO TABS: ORAL_TABLET | ORAL | 3 refills | Status: DC

## 2021-01-25 MED ORDER — DILTIAZEM HCL ER COATED BEADS 120 MG PO CP24: ORAL_CAPSULE | ORAL | 3 refills | Status: DC

## 2021-01-25 MED ORDER — ESCITALOPRAM OXALATE 20 MG PO TABS: ORAL_TABLET | ORAL | 3 refills | Status: DC

## 2021-01-25 MED ORDER — ALBUTEROL SULFATE HFA 108 (90 BASE) MCG/ACT IN AERS: INHALATION_SPRAY | RESPIRATORY_TRACT | 1 refills | Status: DC

## 2021-01-25 MED ORDER — FLUTICASONE-SALMETEROL 100-50 MCG/ACT IN AEPB: 1.0000 | INHALATION_SPRAY | Freq: Two times a day (BID) | RESPIRATORY_TRACT | 3 refills | Status: DC

## 2021-01-25 MED ORDER — ROSUVASTATIN CALCIUM 10 MG PO TABS: ORAL_TABLET | ORAL | 3 refills | Status: DC

## 2021-01-25 NOTE — Patient Instructions (Addendum)
[name] , Thank you for taking time to come for your Annual Wellness Visit. I appreciate your ongoing commitment to your health goals. Please review the following plan we discussed and let me know if I can assist you in the future.   These are the goals we discussed:  Goals      Quit Smoking     Weight (lb) < 200 lb (90.7 kg)        This is a list of the screening recommended for you and due dates:  Health Maintenance  Topic Date Due   Zoster (Shingles) Vaccine (1 of 2) Never done   COVID-19 Vaccine (5 - Booster) 12/17/2020   Pneumococcal Vaccination (1 - PCV) 01/25/2025*   Pap Smear  01/06/2022   Mammogram  05/12/2022   Tetanus Vaccine  09/24/2023   Colon Cancer Screening  08/26/2027   Flu Shot  Completed   Hepatitis C Screening: USPSTF Recommendation to screen - Ages 18-79 yo.  Completed   HIV Screening  Completed   HPV Vaccine  Aged Out  *Topic was postponed. The date shown is not the original due date.     Try B12 sublingual 1000-2000 mcg once weekly    Know what a healthy weight is for you (roughly BMI <25) and aim to maintain this  Aim for 7+ servings of fruits and vegetables daily  65-80+ fluid ounces of water or unsweet tea for healthy kidneys  Limit to max 1 drink of alcohol per day; avoid smoking/tobacco  Limit animal fats in diet for cholesterol and heart health - choose grass fed whenever available  Avoid highly processed foods, and foods high in saturated/trans fats  Aim for low stress - take time to unwind and care for your mental health  Aim for 150 min of moderate intensity exercise weekly for heart health, and weights twice weekly for bone health  Aim for 7-9 hours of sleep daily    SMOKING CESSATION  [employer] society  92330076226 for more information or for a free program for smoking cessation help.   You can call QUIT SMART 1-800-QUIT-NOW for free nicotine patches or replacement therapy- if they are out- keep calling  [facility] Can call for smoking cessation classes, [phone]  If you have a smart phone, please look up Smoke Free app, this will help you stay on track and give you information about money you have saved, life that you have gained back and a ton of more information.     ADVANTAGES OF QUITTING SMOKING Within 20 minutes, blood pressure decreases. Your pulse is at normal level. After 8 hours, carbon monoxide levels in the blood return to normal. Your oxygen level increases. After 24 hours, the chance of having a heart attack starts to decrease. Your breath, hair, and body stop smelling like smoke. After 48 hours, damaged nerve endings begin to recover. Your sense of taste and smell improve. After 72 hours, the body is virtually free of nicotine. Your bronchial tubes relax and breathing becomes easier. After 2 to 12 weeks, lungs can hold more air. Exercise becomes easier and circulation improves. After 1 year, the risk of coronary heart disease is cut in half. After 5 years, the risk of stroke falls to the same as a nonsmoker. After 10 years, the risk of lung cancer is cut in half and the risk of other cancers decreases significantly. After 15 years, the risk of coronary heart disease drops, usually to the level of a  nonsmoker. You will have extra money to spend on things other than cigarettes.     Eating Plan for Osteoporosis Osteoporosis causes your bones to become weak and brittle. This puts you at greater risk for bone breaks (fractures) from small bumps or falls. Making changes to your diet and increasing your physical activity can help strengthen your bones and improve your overall health. Calcium and vitamin D are nutrients that play an important role in bone health. Vitamin D helps your body use calcium and strengthen bones. It is important to get enough calcium and vitamin D as part of your eating plan for osteoporosis. What are tips for following this plan? Reading food  labels Try to get at least 1,000 milligrams (mg) of calcium each day. Look for foods that have at least 50 mg of calcium per serving. Talk with your health care provider about taking a calcium supplement if you do not get enough calcium from food. Do not have more than 2,500 mg of calcium each day. This is the upper limit for food and nutritional supplements combined. Too much calcium may cause constipation and prevent you from absorbing other important nutrients. Choose foods that contain vitamin D. Take a daily vitamin supplement that contains 800-1,000 international units (IU) of vitamin D. The amount may be different depending on your age, body weight, and where you live. Talk with your dietitian or health care provider about how much vitamin D is right for you. Avoid foods that have more than 300 mg of sodium per serving. Too much sodium can cause your body to lose calcium. Talk with your dietitian or health care provider about how much sodium you are allowed each day. Shopping Do not buy foods with added salt, including: Salted snacks. Angie Fava. Canned soups. Canned meats. Processed meats, such as bacon or precooked or cured meat like sausages or meat loaves. Smoked fish. Meal planning Eat balanced meals that contain protein foods, fruits and vegetables, and foods rich in calcium and vitamin D. Eat at least 5 servings of fruits and vegetables each day. Eat 5-6 oz (142-170 g) of lean meat, poultry, fish, eggs, or beans each day. Lifestyle Do not use any products that contain nicotine or tobacco, such as cigarettes, e-cigarettes, and chewing tobacco. If you need help quitting, ask your health care provider. If your health care provider recommends that you lose weight: Work with a dietitian to develop an eating plan that will help you reach your desired weight goal. Exercise for at least 30 minutes a day, 5 or more days a week, or as told by your health care provider. Work with a physical  therapist to develop an exercise plan that includes flexibility, balance, and strength exercises. Do not focus only on aerobic exercise. Do not drink alcohol if: Your health care provider tells you not to drink. You are pregnant, may be pregnant, or are planning to become pregnant. If you drink alcohol: Limit how much you use to: 0-1 drink a day for women. 0-2 drinks a day for men. Be aware of how much alcohol is in your drink. In the [place] one drink equals one 12 oz bottle of beer (355 mL), one 5 oz glass of wine (148 mL), or one 1 oz glass of hard liquor (44 mL). What foods should I eat? Foods high in calcium  Yogurt. Yogurt with fruit. Milk. Evaporated skim milk. Dry milk powder. Calcium-fortified orange juice. Parmesan cheese. Part-skim ricotta cheese. Natural hard cheese. Cream cheese. Cottage cheese. Canned sardines.  Canned salmon. Calcium-treated tofu. Calcium-fortified cereal bar. Calcium-fortified cereal. Calcium-fortified graham crackers. Cooked collard greens. Turnip greens. Broccoli. Kale. Almonds. White beans. Corn tortilla. Foods high in vitamin D Cod liver oil. Fatty fish, such as tuna, mackerel, and salmon. Milk. Fortified soy milk. Fortified fruit juice. Yogurt. Margarine. Egg yolks. Foods high in protein Beef. Lamb. Pork tenderloin. Chicken breast. Tuna (canned). Fish fillet. Tofu. Cooked soy beans. Soy patty. Beans (canned or cooked). Cottage cheese. Yogurt. Peanut butter. Pumpkin seeds. Nuts. Sunflower seeds. Hard cheese. Milk or other milk products, such as soy milk. The items listed above may not be a complete list of foods and beverages you can eat. Contact a dietitian for more options. Summary Calcium and vitamin D are nutrients that play an important role in bone health and are an important part of your eating plan for osteoporosis. Eat balanced meals that contain protein foods, fruits and vegetables, and foods rich in calcium and vitamin D. Avoid  foods that have more than 300 mg of sodium per serving. Too much sodium can cause your body to lose calcium. Exercise is an important part of prevention and treatment of osteoporosis. Aim for at least 30 minutes a day, 5 days a week. This information is not intended to replace advice given to you by your health care provider. Make sure you discuss any questions you have with your health care provider. Document Revised: 08/12/2019 Document Reviewed: 08/12/2019 Elsevier Patient Education  [employer].

## 2021-01-26 ENCOUNTER — Encounter: Payer: Self-pay | Admitting: Adult Health

## 2021-01-26 DIAGNOSIS — K760 Fatty (change of) liver, not elsewhere classified: Secondary | ICD-10-CM | POA: Insufficient documentation

## 2021-01-26 DIAGNOSIS — L03115 Cellulitis of right lower limb: Secondary | ICD-10-CM | POA: Diagnosis not present

## 2021-01-26 LAB — CBC WITH DIFFERENTIAL/PLATELET
Absolute Monocytes: 1059 cells/uL — ABNORMAL HIGH (ref 200–950)
Basophils Absolute: 40 cells/uL (ref 0–200)
Basophils Relative: 0.3 %
Eosinophils Absolute: 174 cells/uL (ref 15–500)
Eosinophils Relative: 1.3 %
HCT: 41.4 % (ref 35.0–45.0)
Hemoglobin: 13.7 g/dL (ref 11.7–15.5)
Lymphs Abs: 2935 cells/uL (ref 850–3900)
MCH: 30.2 pg (ref 27.0–33.0)
MCHC: 33.1 g/dL (ref 32.0–36.0)
MCV: 91.4 fL (ref 80.0–100.0)
MPV: 10.8 fL (ref 7.5–12.5)
Monocytes Relative: 7.9 %
Neutro Abs: 9192 cells/uL — ABNORMAL HIGH (ref 1500–7800)
Neutrophils Relative %: 68.6 %
Platelets: 278 10*3/uL (ref 140–400)
RBC: 4.53 10*6/uL (ref 3.80–5.10)
RDW: 12 % (ref 11.0–15.0)
Total Lymphocyte: 21.9 %
WBC: 13.4 10*3/uL — ABNORMAL HIGH (ref 3.8–10.8)

## 2021-01-26 LAB — COMPLETE METABOLIC PANEL WITH GFR
AG Ratio: 2.2 (calc) (ref 1.0–2.5)
ALT: 13 U/L (ref 6–29)
AST: 13 U/L (ref 10–35)
Albumin: 4.3 g/dL (ref 3.6–5.1)
Alkaline phosphatase (APISO): 93 U/L (ref 37–153)
BUN: 15 mg/dL (ref 7–25)
CO2: 28 mmol/L (ref 20–32)
Calcium: 9.4 mg/dL (ref 8.6–10.4)
Chloride: 103 mmol/L (ref 98–110)
Creat: 0.94 mg/dL (ref 0.50–1.05)
Globulin: 2 g/dL (calc) (ref 1.9–3.7)
Glucose, Bld: 86 mg/dL (ref 65–99)
Potassium: 4.3 mmol/L (ref 3.5–5.3)
Sodium: 140 mmol/L (ref 135–146)
Total Bilirubin: 0.6 mg/dL (ref 0.2–1.2)
Total Protein: 6.3 g/dL (ref 6.1–8.1)
eGFR: 69 mL/min/{1.73_m2} (ref >=60)

## 2021-01-26 LAB — MICROALBUMIN / CREATININE URINE RATIO
Creatinine, Urine: 226 mg/dL (ref 20–275)
Microalb Creat Ratio: 3 mcg/mg creat (ref <30)
Microalb, Ur: 0.7 mg/dL

## 2021-01-26 LAB — HEMOGLOBIN A1C
Hgb A1c MFr Bld: 5.3 % of total Hgb (ref <5.7)
Mean Plasma Glucose: 105 mg/dL
eAG (mmol/L): 5.8 mmol/L

## 2021-01-26 LAB — PAP, TP IMAGING W/ HPV RNA, RFLX HPV TYPE 16,18/45: HPV DNA High Risk: NOT DETECTED

## 2021-01-26 LAB — VITAMIN B12: Vitamin B-12: 370 pg/mL (ref 200–1100)

## 2021-01-26 LAB — URINALYSIS, ROUTINE W REFLEX MICROSCOPIC
Bilirubin Urine: NEGATIVE
Glucose, UA: NEGATIVE
Hyaline Cast: NONE SEEN /LPF
Ketones, ur: NEGATIVE
Leukocytes,Ua: NEGATIVE
Nitrite: NEGATIVE
Specific Gravity, Urine: 1.024 (ref 1.001–1.035)
Yeast: NONE SEEN /HPF
pH: 5.5 (ref 5.0–8.0)

## 2021-01-26 LAB — PAP, TP IMAGING, WNL RFLX HPV

## 2021-01-26 LAB — VITAMIN D 25 HYDROXY (VIT D DEFICIENCY, FRACTURES): Vit D, 25-Hydroxy: 54 ng/mL (ref 30–100)

## 2021-01-26 LAB — LIPID PANEL
Cholesterol: 120 mg/dL (ref <200)
HDL: 59 mg/dL (ref >=50)
LDL Cholesterol (Calc): 46 mg/dL (calc)
Non-HDL Cholesterol (Calc): 61 mg/dL (calc) (ref <130)
Total CHOL/HDL Ratio: 2 (calc) (ref <5.0)
Triglycerides: 66 mg/dL (ref <150)

## 2021-01-26 LAB — MICROSCOPIC MESSAGE

## 2021-01-26 LAB — MAGNESIUM: Magnesium: 1.9 mg/dL (ref 1.5–2.5)

## 2021-01-26 LAB — TSH: TSH: 1.5 mIU/L (ref 0.40–4.50)

## 2021-01-27 ENCOUNTER — Other Ambulatory Visit: Payer: Self-pay | Admitting: Adult Health

## 2021-01-27 DIAGNOSIS — R87629 Unspecified abnormal cytological findings in specimens from vagina: Secondary | ICD-10-CM | POA: Insufficient documentation

## 2021-01-29 ENCOUNTER — Telehealth: Payer: Self-pay | Admitting: Internal Medicine

## 2021-01-29 NOTE — Chronic Care Management (AMB) (Signed)
  Chronic Care Management   Note  01/29/2021 Name: [name] MRN: [id number] DOB: [birth year]  Nicole Phelps is a 60 y.o. year old female who is a primary care patient of Unk Pinto, MD. I reached out to Nicole Phelps by phone today in response to a referral sent by Ms. Beckie Busing Urquilla's PCP, Unk Pinto, MD.   Ms. Braun was given information about Chronic Care Management services today including:  CCM service includes personalized support from designated clinical staff supervised by her physician, including individualized plan of care and coordination with other care providers 24/7 contact phone numbers for assistance for urgent and routine care needs. Service will only be billed when office clinical staff spend 20 minutes or more in a month to coordinate care. Only one practitioner may furnish and bill the service in a calendar month. The patient may stop CCM services at any time (effective at the end of the month) by phone call to the office staff.   Patient agreed to services and verbal consent obtained.   Follow up plan:   Tatjana Secretary/administrator

## 2021-02-08 ENCOUNTER — Encounter: Payer: Self-pay | Admitting: Radiology

## 2021-02-24 ENCOUNTER — Other Ambulatory Visit: Payer: Self-pay | Admitting: Nurse Practitioner

## 2021-02-24 DIAGNOSIS — K219 Gastro-esophageal reflux disease without esophagitis: Secondary | ICD-10-CM

## 2021-03-14 ENCOUNTER — Ambulatory Visit: Payer: Medicare HMO | Admitting: Pharmacist

## 2021-03-14 ENCOUNTER — Other Ambulatory Visit: Payer: Self-pay

## 2021-03-14 DIAGNOSIS — I2510 Atherosclerotic heart disease of native coronary artery without angina pectoris: Secondary | ICD-10-CM

## 2021-03-14 DIAGNOSIS — E559 Vitamin D deficiency, unspecified: Secondary | ICD-10-CM

## 2021-03-14 DIAGNOSIS — F411 Generalized anxiety disorder: Secondary | ICD-10-CM

## 2021-03-14 DIAGNOSIS — F3341 Major depressive disorder, recurrent, in partial remission: Secondary | ICD-10-CM

## 2021-03-14 DIAGNOSIS — E538 Deficiency of other specified B group vitamins: Secondary | ICD-10-CM

## 2021-03-14 DIAGNOSIS — E669 Obesity, unspecified: Secondary | ICD-10-CM

## 2021-03-14 DIAGNOSIS — M85851 Other specified disorders of bone density and structure, right thigh: Secondary | ICD-10-CM

## 2021-03-14 DIAGNOSIS — J45909 Unspecified asthma, uncomplicated: Secondary | ICD-10-CM

## 2021-03-14 DIAGNOSIS — I700 Atherosclerosis of aorta: Secondary | ICD-10-CM

## 2021-03-14 DIAGNOSIS — F17200 Nicotine dependence, unspecified, uncomplicated: Secondary | ICD-10-CM

## 2021-03-14 DIAGNOSIS — E785 Hyperlipidemia, unspecified: Secondary | ICD-10-CM

## 2021-03-14 DIAGNOSIS — J449 Chronic obstructive pulmonary disease, unspecified: Secondary | ICD-10-CM

## 2021-03-14 DIAGNOSIS — I10 Essential (primary) hypertension: Secondary | ICD-10-CM

## 2021-03-16 NOTE — Progress Notes (Signed)
Patient Visit  Nicole, Phelps [id number] 61 years, Female  DOB: [birth year]  M: [phone] Care Team: Rhys Martini, Herberth Deharo  _______________________________________________ Summary: Nicole Phelps is a pleasant 61 year old female who presents for initial CCM visit. She has no chief complaints today other than getting over a slight cold. She recently lost her sister in law and family is going through grieving at this time  Recommendations/Changes made from today's visit: Recommended CBT, which patient declines at this time Recommended smoking cessation    Chronic Conditions Patient's Chronic Conditions: Hypertension (HTN), Asthma, Chronic Obstructive Pulmonary Disease (COPD), Osteopenia or Osteoporosis, Hyperlipidemia/Dyslipidemia (HLD), Depression, Vitamin D deficiency, B12 deficiency  Doctor and Hospital Visits Were there PCP Visits in last 6 months?: Yes Visit #1: 01/25/21- Liane Comber, NP (PCP)- Patient presented for her annual exam. Stopped- omeprazole (PRILOSEC) 40 MG capsule 1 cap daily. Were there Specialist Visits in last 6 months?: Yes Visit #1: 01/25/21- Solon Augusta (Ophthalmology)- No other information available. Was there a Hospital Visit in last 30 days?: No Were there other Hospital Visits in last 6 months?: No  Medication Information Are there any Medication discrepancies?: Yes Details: rosuvastatin (CRESTOR) 10 MG-  [facility] Are there any Medication adherence gaps (beyond 5 days past due)?: Yes Details: rosuvastatin (CRESTOR) 10 MG-01/25/21 (&gt;5 days gap) 90DS Medication adherence rates for the STAR rating drugs: rosuvastatin (CRESTOR) 10 MG-01/25/21 90DS List Patient's current Care Gaps: No current Care Gaps identified  Disease Assessments Subjective Information Current BP: 120/76 Current HR: 67 taken on: 01/25/2021 Previous BP: 122/72 Previous HR: 62 taken on: 08/22/2020 Weight: 190 BMI: 31.6 Last GFR: 60 taken on:  01/25/2021 Why did the patient present?: CCM initial visit Marital status?: Married Details: Korine Winton Retired? Previous work?: Pt is currently on Disability What does the patient do during the day?: Pt does not do much. She clean house, dog-sits 1 to 2 days a week, goes shopping. Who does the patient spend their time with and what do they do?: Live with husband and daughter and lives 7 miles away. Husband is mostly working. However recently, patient lost her sister in law so the husband has been home. Lifestyle habits such as diet and exercise?: Diet: sausage biscuit, lunch sometimes does not eat, husband cooks, supper: tries to eat a meat and 2 veggies. coffee: none Water: 4 16-ounce  bottles daily Alcohol, tobacco, and illicit drug usage?: Alcohol: rarely, glass of wine Tobacco: yes 5-6 cigarettes Illegal drugs: smoke marijuanna occassionaly  What is the patient's sleep pattern?: No sleep issues How many hours per night does patient typically sleep?: 8 hours Patient pleased with health care they are receiving?: Yes Family, occupational, and living circumstances relevant to overall health?:  Mother (Deceased) Arthritis   Colon cancer (Age: 27) Father (Deceased) Hypertension Stroke  Sister Associate Professor) Breast cancer (Age: 37)  Maternal Aunt (Deceased) Colon cancer  Maternal Grandmother (Deceased) Colon cancer  Paternal Grandfather (Deceased) Heart attack In his 38s Name and location of [id number] [place], [place] Current Rx insurance plan: HTA Are meds synced by current pharmacy?: Yes Are meds delivered by current pharmacy?: No - delivery available but patient prefers to not use Would patient benefit from direct intervention of clinical lead in dispensing process to optimize clinical outcomes?: Yes Are [facility] pharmacy services available where patient lives?: Yes Is patient disadvantaged to use [facility]acy?: No [facility] services reviewed with patient  and patient wishes to change pharmacy?: No Select reason patient declined to change pharmacies: Patient preference Does patient experience delays in picking up medications due to transportation concerns (getting to pharmacy)?: No Any additional demeanor/mood notes?: [name] is a pleasant 61 year old female who presents for initial CCM visit. She has no chief complaints today. She recently lost her sister in law  Hypertension (HTN) Assess this condition today?: Yes Is patient able to obtain BP reading today?: No Goal: <130/80 mmHG Hypertension Stage: Pre-HTN (SBP: 120-129 and DBP < 80) Is Patient checking BP at home?: No How often does patient miss taking their blood pressure medications?: Never Has patient experienced hypotension, dizziness, falls or bradycardia?: No BP RPM device: No We discussed: DASH diet:  following a diet emphasizing fruits and vegetables and low-fat dairy products along with whole grains, fish, poultry, and nuts. Reducing red meats and sugars., Smoking cessation, Reducing the amount of salt intake to 1500mg /per day., Recommend using a salt substitute to replace your salt if you need flavor. Assessment:: Controlled Drug: Diltiazem 120mg  QD Assessment: Appropriate, Effective, Safe, Accessible Additional Info: Pt does not have a BP cuff at home [facility] Follow up: N/A Pharmacist Follow up: Continue to monitor BP, enroll pt in BP RPM when available  Hyperlipidemia/Dyslipidemia (HLD) Last Lipid panel on: 01/25/2021 TC (Goal<200): 120 LDL: 46 HDL (Goal>40): 59 TG (Goal<150): 66 ASCVD 10-year risk?is:: N/A due to lab values outside the range Assess this condition today?: Yes LDL Goal: <70 Has patient tried and failed any HLD Medications?: No We discussed: How a diet high in plant sterols (fruits/vegetables/nuts/whole grains/legumes) may reduce your cholesterol., Encouraged increasing fiber to a daily intake of  10-25g/day Assessment:: Controlled Drug: Rosuvastatin 10mg  QD Assessment: Appropriate, Effective, Safe, Accessible HC Follow up: N/A Pharmacist Follow up: Assess lipid panel, LFTs, s/s rhabdo  Chronic Obstructive Pulmonary Disease (COPD) Current FEV1/FVC: unknown Current FEV1: unknown taken on: 01/25/2021 Current Eosinophils: 1.3 taken on: 01/25/2021 CAT Score: Unknown Assess this condition today?: Yes Gold group: A (low sx, < 2 exacerbations / yr) Exacerbations in past year without hospitalization: No Is patient currently Smoking or Vaping?: Yes See Tobacco Use Disorder section for details: Done Home oxygen therapy: No Frequency of SABA/SAMA use: Several times per month Influenza vaccine: Yes Prevnar vaccine: Yes Pneumovax vaccine: Yes We counseled the patient on:: Inhaler technique, Plan for acute exacerbations Assessment:: Controlled Drug: Advair 100-50 inhaler 1 puff BID Assessment: Appropriate, Effective, Safe, Accessible Drug: Albuterol rescue inhaler PRN Assessment: Appropriate, Effective, Safe, Accessible Additional Info: Pt is currently recovering from a cold; therefore she is having more symptoms of SOB and wheezing than usual. Pt states that Advair is currently costing her $30 per 3 boxes. She is able to afford at this time [facility] Follow up: N/A Pharmacist Follow up: Continue to assess inhaler technique, assess breathing  Depression Previous PHQ-9 Score: Unknown Assess this condition today?: Yes Completing the PHQ-9 Questionnaire today?: Yes PHQ-9: Over the last 2 weeks, how often have you been bothered by the following problems?: Done Little interest or pleasure in doing things: 2 (More than half of the days) Feeling down, depressed, or hopeless: 0 (Not at all) Trouble falling or staying asleep, or sleeping too much: 0 (Not at all) Feeling tired or having little energy: 3 (Nearly every day) Poor appetite or overeating: 3 (Nearly every day) Feeling bad about  yourself  or that you are a failure or have let yourself or your family down: 0 (Not at all) Trouble concentrating on things,  such as reading the newspaper or watching television: 0 (Not at all) Moving or speaking so slowly that other people could have noticed? Or the opposite - being so fidgety or restless that you have been moving around a to more than usual: 0 (Not at all) Thoughts that you would be better off dead or of hurting yourself in some way: 0 (Not at all) Total PHQ-9 Score (please total responses for questions  above): 8 Depression Severity: Mild Depression (Score: 5-9) In your opinion, how do you feel your depression symptoms have been controlled over the past 3 months?: Stable / stayed the same Patient has tried and failed: Pt states that she is mainly bored being on disability and mostly home alone which can affect her mood. Pt states that since knee replacement 2 years ago, and ankle tear 5 years ago, it is sometimes hard to walk outside and be active Assessment:: Controlled Drug: Buspirone 15mg  TID Assessment: Appropriate, Effective, Safe, Accessible Drug: Escitalopram 20mg  QD Assessment: Appropriate, Effective, Safe, Accessible HC Follow up: N/A Pharmacist Follow up: Continue to assess mood, pt may also benefit from CBT therapy.  Tobacco Use Disorder Assess this condition today?: Yes Is patient currently Smoking or Vaping?: Yes How long has patient been smoking/vaping?: since 16 years How much does patient smoke/vape per day?: 5-6 How soon after you wake up do you smoke your first cigarette?: Within 30 minutes Do you find it difficult to refrain from smoking in places where it is forbidden (church, Art therapist, Archivist, restaurants)?: Yes Which cigarette would you hate most to give up?: The one first thing in the morning Do you smoke more during the morning hours than during the rest of the day?: No What do you like about smoking?: She smokes mainly out of boredom. She usually  smokes 1 in morning to help her use the bathroom, then smokes another cigarette 2 hours later, and then smokes one after lunch What health problems has your smoking contributed to?: HTN, CAD, Asthma/COPD, HLD Have you ever tried to quit smoking?: Yes How many times have you tried to quit?: during pregnancies Was it difficult or easy to quit smoking?: Difficult Why did you try to quit smoking?: Children's / grandchildren's health How do you feel about quitting smoking now or in the near future?: Not interested in quitting / haven't thought about it What are your concerns about quitting?: Did not like patches. Felt like pharmacotherapy does not work What happened when you tried to quit in the past?: Pt states that patches made her feel crazy, she had very wild and scary dreams What holds you back from trying to quit smoking?: Nothing to do. She usually goes outside to smoke, so if it is too cold, she may not be able to smoke due to the weather Assessment:: Uncontrolled Drug: None Assessment: Query need HC Follow up: N/a Pharmacist Follow up: recommend smoking cessation at next visit. Encouraged patient to try to get involved in other activities and habits that will distract her form smoking  Exercise, Diet and Non-Drug Coordination Needs Additional exercise counseling points. We discussed: decreasing sedentary behavior, incorporating flexibility, balance, and strength training exercises, encouraging participation in insurance-covered exercise program through local gym (i.e. [employer]) Additional diet counseling points. We discussed: key components of the DASH diet Discussed Non-Drug Care Coordination Needs: Yes Does Patient have Medication financial barriers?: No  Accountable Health Communities Health-Related Social Needs Screening Tool -  SDOH  ([email or web address]) What is your living situation today? (ref #1): I  have a steady place to  live Think about the place you live. Do you have problems with any of the following? (ref #2): None of the above Within the past 12 months, you worried that your food would run out before you got money to buy more (ref #3): Never true Within the past 12 months, the food you bought just didn't last and you didn't have money to get more (ref #4): Never true In the past 12 months, has lack of reliable transportation kept you from medical appointments, meetings, work or from getting things needed for daily living? (ref #5): No In the past 12 months, has the electric, gas, oil, or water company threatened to shut off services in your home? (ref #6): No How often does anyone, including family and friends, physically hurt you? (ref #7): Never (1) How often does anyone, including family and friends, insult or talk down to you? (ref #8): Never (1) How often does anyone, including friends and family, threaten you with harm? (ref #9): Never (1) How often does anyone, including family and friends, scream or curse at you? (ref #10): Never (1)  Engagement Notes Newton Pigg on 03/14/2021 06:39 PM CPP Chart Review: 23 min CPP Office Visit: 39 min CPP Office Visit Documentation: 51 min CPP Coordination of Care: [place] Completion:21 min CPP Care Plan Review:19 min   Newton Pigg on 03/14/2021 06:38 PM CPP F/u: 07/25/21: Ok [place] w Caryl Pina 08/21/21 @11am : CCM f/u visit phone (BP RPM if available, smoking cessation, any new hobbies, how has family been coping with sister in law death. How is the relationship with patient's daughter)   Care Gaps: COVID Booster: Pt will complete at local pharmacy   Patient Name: Season, Astacio DOB:  [birth year]   Last Care Plan Update: 03/14/2021 COMPREHENSIVE CARE PLAN AND GOALS   HYPERTENSION  MOST RECENT BLOOD PRESSURE:     120/76 MY GOAL BLOOD PRESSURE:  <130/80 mmHG CURRENT MEDICATION AND DOSING:  Diltiazem 120mg  once daily THE GOALS WE HAVE CHOSEN ARE:     -Maintain an at goal blood pressure  BARRIERS TO ACHIEVING GOALS:  -At goal PLAN TO WORK ON THESE GOALS:  -Take medications regularly   -Check BP at home  -Reduce salt intake (< 1500mg / day)  -Diet: DASH diet (Choose fruits, vegetables, and low-fat dairy products. Increase whole grains, fish, poultry, nuts. Reduce red meats and sugars)  -Weight: 1 kg = ~1 mmHg reduction  -Exercise -Smoking Cessation  CHOLESTEROL  MOST RECENT LABS:    01/25/2021 -TOTAL CHOLESTEROL: 120 -TRIGLYCERIDES: 66 -HDL: 59 -LDL: 46 CURRENT MEDICATION AND DOSING:  Rosuvastatin 10mg  once daily   THE GOALS WE HAVE CHOSEN ARE:    -Total Cholesterol goal under 200, Triglycerides goal under 150, HDL goal above 40, LDL goal under 100  BARRIERS TO ACHIEVING GOALS:  -At goal PLAN TO WORK ON THESE GOALS:  -Take medication regularly  -Diet: high in plant sterols (fruits/ vegetables/ nuts/ whole grains/ legumes). Increase fiber intake (10-25g/day). Avoid foods high in cholesterol (red meat, egg yolks, dairy, oils/ butter). Choose low-fat options. -Exercise  -Weight Management   COPD  CURRENT CLASSIFICATION AS:    A (low sx, <2 exacerbations / yr) EOSINOPHIL COUNT:     -EOS %: 1.3                       -EOS ABSOLUTE: 174 CURRENT MEDICATION AND DOSING:  Advair 100-50 inhaler 1 puff twice daily, Albuterol rescue inhaler as needed THE  GOALS WE HAVE CHOSEN ARE:  -Reduce shortness of breath and reduce hospitalizations due to COPD exacerbations  BARRIERS TO ACHIEVING GOALS:  -At goal PLAN TO WORK ON THESE GOALS:    -Use inhalers regularly as directed  -Educated on inhaler usage and discussed technique  -Educated on ways to prevent exacerbation of COPD  -Stay up to date on influenza and pneumococcal immunizations   -Call CPP or MD immediately if: more coughing, wheezing, SOB than usual; changes in color, thickness or amount of mucus; feeling tired for more than one day; swelling of legs or ankles; trouble  sleeping; feeling the need to increase oxygen (low oxygen levels on pulse ox)  -Call 911: severe shortness of breath or chest pain; blue color in lips, fingers; confusion, disorientation, difficulty speaking in full sentences   Depression  CURRENT REGIMEN AND DOSING:  Buspirone 15mg  3 times daily, Escitalopram 20mg  once daily THE GOALS WE HAVE CHOSEN ARE:  -Lower and manage symptoms of depression  PLAN TO WORK ON THESE GOALS:    -Continue medication as prescribed  -Inform practitioner of any signs and symptoms of worsening depression  -Symptoms: persistent feeling of hopelessness, dejection, constant worry, poor concentration, lack of energy, inability to sleep, sometimes suicidal tendencies, agitation, decreased concentration, sleep changes, diminished interest/ pleasure, changes in appetite    ACTIVE MEDICATION LIST  Your current medication list has been updated. To view, log in to your patient portal.  Call if any changes need to be made.   MEDICATION REVIEW  MEDICATION REVIEW CONDUCTED:   Yes   DATE:    03/13/2021 BEST POSSIBLE MEDICATION HISTORY  SOURCE:   Medical Records     HOW DO I? - WHEN DO I?   GET AHOLD OF MY DOCTOR?   DURING BUSINESS HOURS WHEN THE OFFICE IS OPEN    PHONE: [phone] AFTER HOURS [facility] NURSE WHEN THE OFFICE IS CLOSED   PHONE: [phone]  TALK TO [place] CARE COORDINATOR NAME: Newton Pigg  PHONE: [phone] EMAIL:  [email or web address] CARE COORDINATOR STAFF   NAME: Otto Herb  PHONE: [phone]  NOTE SECTION  Thank you for participating in the Chronic Care Management (CCM) program with Dr. Newton Pigg   This program takes a proactive approach to your health and my team will serve as a resource for you throughout the year. Please follow up at [phone] if you have any questions or experience changes to your overall health. Your next CCM appointment will be conducted with Newton Pigg, PharmD as follows:     Date: 08/21/2021   Time: 11:00AM  Over the Phone      Rachelle Hora. Jeannett Senior, PharmD  Clinical Pharmacist  [email or web address]  [phone]

## 2021-03-26 ENCOUNTER — Encounter: Payer: Self-pay | Admitting: Internal Medicine

## 2021-04-09 DIAGNOSIS — H02423 Myogenic ptosis of bilateral eyelids: Secondary | ICD-10-CM | POA: Diagnosis not present

## 2021-04-09 DIAGNOSIS — H53453 Other localized visual field defect, bilateral: Secondary | ICD-10-CM | POA: Diagnosis not present

## 2021-04-09 DIAGNOSIS — H53483 Generalized contraction of visual field, bilateral: Secondary | ICD-10-CM | POA: Diagnosis not present

## 2021-04-09 DIAGNOSIS — E785 Hyperlipidemia, unspecified: Secondary | ICD-10-CM | POA: Diagnosis not present

## 2021-04-09 DIAGNOSIS — H57813 Brow ptosis, bilateral: Secondary | ICD-10-CM | POA: Diagnosis not present

## 2021-04-09 DIAGNOSIS — H02834 Dermatochalasis of left upper eyelid: Secondary | ICD-10-CM | POA: Diagnosis not present

## 2021-04-09 DIAGNOSIS — E559 Vitamin D deficiency, unspecified: Secondary | ICD-10-CM | POA: Diagnosis not present

## 2021-04-09 DIAGNOSIS — J45909 Unspecified asthma, uncomplicated: Secondary | ICD-10-CM | POA: Diagnosis not present

## 2021-04-09 DIAGNOSIS — I10 Essential (primary) hypertension: Secondary | ICD-10-CM | POA: Diagnosis not present

## 2021-04-09 DIAGNOSIS — H02831 Dermatochalasis of right upper eyelid: Secondary | ICD-10-CM | POA: Diagnosis not present

## 2021-04-09 HISTORY — PX: OTHER SURGICAL HISTORY: SHX169

## 2021-04-09 HISTORY — PX: BLEPHAROPLASTY: SUR158

## 2021-04-10 ENCOUNTER — Encounter: Payer: BC Managed Care – PPO | Admitting: Obstetrics & Gynecology

## 2021-05-29 ENCOUNTER — Ambulatory Visit: Payer: Medicare HMO | Admitting: Obstetrics & Gynecology

## 2021-05-29 ENCOUNTER — Other Ambulatory Visit: Payer: Self-pay

## 2021-05-29 ENCOUNTER — Encounter: Payer: Self-pay | Admitting: Obstetrics & Gynecology

## 2021-05-29 ENCOUNTER — Telehealth: Payer: Self-pay

## 2021-05-29 VITALS — BP 142/83 | HR 59 | Ht 65.0 in | Wt 196.4 lb

## 2021-05-29 DIAGNOSIS — R87610 Atypical squamous cells of undetermined significance on cytologic smear of cervix (ASC-US): Secondary | ICD-10-CM | POA: Diagnosis not present

## 2021-05-29 DIAGNOSIS — N951 Menopausal and female climacteric states: Secondary | ICD-10-CM

## 2021-05-29 DIAGNOSIS — Z90710 Acquired absence of both cervix and uterus: Secondary | ICD-10-CM | POA: Diagnosis not present

## 2021-05-29 NOTE — Telephone Encounter (Signed)
Called to confirm patients appointment for 05/29/21 at 9:15 with Dr. Harolyn Rutherford. Voicemail was not set up.  ?

## 2021-05-29 NOTE — Progress Notes (Signed)
? ?GYNECOLOGY OFFICE VISIT NOTE ? ?History:  ? Nicole Phelps is a 61 y.o. [phone] here today for discussion of recent pap smear results. She is s/p TAH in 2003 for abnormal bleeding, had benign pathology. Uterus and cervix were removed, ovaries are still in place.  She had a vaginal cuff pap smear done in 12/30/2017 which returned as ASCUS with negative HPV, had same result on 01/25/2021. She was sent here for further evaluation.  She also reports some bothersome menopausal symptoms: night sweats especially and mild vaginal dryness.  She denies any abnormal vaginal discharge, bleeding, pelvic pain or other concerns.  ?  ?Past Medical History:  ?Diagnosis Date  ? Allergy   ? Arthritis   ? Asthma   ? Benign hematuria 2004  ? Depression   ? Hyperlipidemia   ? Hypertension   ? TMJ (temporomandibular joint syndrome)   ? Vitamin D deficiency   ? ? ?Past Surgical History:  ?Procedure Laterality Date  ? ANKLE SURGERY Left 2017  ? ORIF  ? BREAST SURGERY Left   ? lumpectomy benign  ? eye lift Bilateral 04/09/2021  ? KNEE ARTHROSCOPY W/ DEBRIDEMENT Right 11/2017  ? TOTAL ABDOMINAL HYSTERECTOMY  2003  ? Ovaries in place. Done for bleeding, had benign pathology.  ? TOTAL KNEE ARTHROPLASTY Right 02/2019  ? Dr. Berenice Primas  ? TUBAL LIGATION    ? ? ?The following portions of the patient's history were reviewed and updated as appropriate: allergies, current medications, past family history, past medical history, past social history, past surgical history and problem list.  ? ?Health Maintenance:  Normal mammogram on 05/11/2020, already scheduled for one this year.  Benign colonoscopy in 2019, gets every 5 years due to [place] of colon cancer in mother and maternal aunt.  ? ?Review of Systems:  ?Pertinent items noted in HPI and remainder of comprehensive ROS otherwise negative. ? ?Physical Exam:  ?BP (!) 142/83 (BP Location: Left Arm, Patient Position: Sitting, Cuff Size: Normal)   Pulse (!) 59   Ht '5\' 5"'$  (1.651 m)   Wt 196 lb 6.4 oz (89.1  kg)   BMI 32.68 kg/m?  ?CONSTITUTIONAL: Well-developed, well-nourished female in no acute distress.  ?HEENT:  Normocephalic, atraumatic. External right and left ear normal. No scleral icterus.  ?NECK: Normal range of motion, supple, no masses noted on observation ?SKIN: No rash noted. Not diaphoretic. No erythema. No pallor. ?MUSCULOSKELETAL: Normal range of motion. No edema noted. ?NEUROLOGIC: Alert and oriented to person, place, and time. Normal muscle tone coordination. No cranial nerve deficit noted. ?PSYCHIATRIC: Normal mood and affect. Normal behavior. Normal judgment and thought content. ?CARDIOVASCULAR: Normal heart rate noted ?RESPIRATORY: Effort and breath sounds normal, no problems with respiration noted ?ABDOMEN: No masses noted. No other overt distention noted.   ?PELVIC: Deferred ?    ?Assessment and Plan:  ?   ?1. ASCUS of cervix with negative high risk HPV in 2020 ?2. S/P hysterectomy ?ASCUS with negative HRHPV of the vaginal cuff is not concerning, no further evaluation needed. No risk factors for vaginal dysplasia, had hysterectomy for benign indications. No further pap smears recommended. Patient was reassured. ? ?3. Menopausal symptoms ?Patient with bothersome menopausal vasomotor symptoms. Discussed lifestyle interventions such as wearing light clothing, remaining in cool environments, having fan/air conditioner in the room, avoiding hot beverages etc.  Discussed using hormone therapy and concerns about increased risk of heart disease, cerebrovascular disease, thromboembolic disease, and breast cancer.  Also discussed other medical options such as antidepressants for mood  changes, hypoallergenic water based lubricants for vaginal dryness or Neurontin for hot flashes.   Also discussed alternative therapies such as herbal remedies but cautioned that most of the products contained phytoestrogens (plant estrogens) in unregulated amounts which can have the same effects on the body as the  pharmaceutical estrogen preparations.  Also referred her to www.[email or web address].org for other alternative options.   Patient will let us know if she wants to try anything else. ?Please refer to After Visit Summary for other counseling recommendations.  ? ?Return for any gynecologic concerns.   ? ?I spent 20 minutes dedicated to the care of this patient including pre-visit review of records, face to face time with the patient discussing her conditions and treatments. ? ? ? ?Verita Schneiders, MD, FACOG ?Obstetrician Social research officer, government, Faculty Practice ?[facility] ? ? ? ? ? ? ?

## 2021-05-29 NOTE — Patient Instructions (Signed)
Patient with bothersome menopausal vasomotor symptoms. Discussed lifestyle interventions such as wearing light clothing, remaining in cool environments, having fan/air conditioner in the room, avoiding hot beverages etc.  Discussed using hormone therapy and concerns about increased risk of heart disease, cerebrovascular disease, thromboembolic disease,  and breast cancer.  Also discussed other medical options such as antidepressants for mood changes or Neurontin for hot flashes.   Also discussed alternative therapies such as herbal remedies but cautioned that most of the products contained phytoestrogens (plant estrogens) in unregulated amounts which can have the same effects on the body as the pharmaceutical estrogen preparations.  Also referred her to www.[email or web address].org for other alternative options.   ? ? ? ?  ?

## 2021-06-04 DIAGNOSIS — Z1231 Encounter for screening mammogram for malignant neoplasm of breast: Secondary | ICD-10-CM | POA: Diagnosis not present

## 2021-06-04 LAB — HM MAMMOGRAPHY

## 2021-06-07 ENCOUNTER — Encounter: Payer: Self-pay | Admitting: Adult Health

## 2021-06-12 ENCOUNTER — Encounter: Payer: Self-pay | Admitting: Internal Medicine

## 2021-06-12 ENCOUNTER — Encounter: Payer: Self-pay | Admitting: Adult Health

## 2021-06-14 ENCOUNTER — Encounter: Payer: Self-pay | Admitting: Nurse Practitioner

## 2021-06-14 ENCOUNTER — Ambulatory Visit (INDEPENDENT_AMBULATORY_CARE_PROVIDER_SITE_OTHER): Payer: Medicare HMO | Admitting: Nurse Practitioner

## 2021-06-14 VITALS — BP 128/76 | HR 76 | Temp 97.9°F | Resp 16 | Ht 65.0 in | Wt 197.4 lb

## 2021-06-14 DIAGNOSIS — L988 Other specified disorders of the skin and subcutaneous tissue: Secondary | ICD-10-CM

## 2021-06-14 DIAGNOSIS — L989 Disorder of the skin and subcutaneous tissue, unspecified: Secondary | ICD-10-CM | POA: Diagnosis not present

## 2021-06-14 NOTE — Progress Notes (Signed)
Assessment and Plan: ? ?Nicole Phelps was seen today for an episodic visit. ? ?Diagnoses and all order for this visit: ? ?1. Skin lesion of breast/Encounter for removal of skin lesion ? ?Liquid nitrogen was applied for 10-12 seconds to the skin lesions x3 and the expected blistering or scabbing reaction explained. Do not pick at the areas. Patient reminded to expect hypopigmented scars from the procedure. Return if lesions fail to fully resolve. ? ?Patient tolerated procedure well.  ? ?Referral to Dermatology if not resolved.  ? ?- PR DESTRUC BENIGN/PREMAL,2-14 LESIONS ? ?Further disposition pending results of labs. Discussed med's effects and SE's.   ?Over 30 minutes of exam, counseling, chart review, and critical decision making was performed.  ? ?Future Appointments  ?Date Time Provider [place]  ?07/25/2021 11:00 AM Darrol Jump, NP [facility] None  ?08/21/2021 11:00 AM Newton Pigg, RPH [facility] None  ?01/29/2022  2:00 PM Liane Comber, NP [facility] None  ? ? ?------------------------------------------------------------------------------------------------------------------ ? ? ?HPI ?BP 128/76   Pulse 76   Temp 97.9 ?F (36.6 ?C)   Resp 16   Ht '5\' 5"'$  (1.651 m)   Wt 197 lb 6.4 oz (89.5 kg)   SpO2 97%   BMI 32.85 kg/m?  ? ?61 y.o.female presents with c/o pimple on chest that has been present for the past two weeks. She has two other areas that are similar. She has squeezed the area without any drainage.  Reports  mild pain.  Has put hot and cold compresses along with Vicks Vapor Rub without relief.  Does not see Dermatology.  She does not perform skin checks.  Denies erythema, edema, streaking, pruritis.  ? ? ?Past Medical History:  ?Diagnosis Date  ? Allergy   ? Arthritis   ? Asthma   ? Benign hematuria 2004  ? Depression   ? Hyperlipidemia   ? Hypertension   ? TMJ (temporomandibular joint syndrome)   ? Vitamin D deficiency   ?  ? ?Allergies  ?Allergen Reactions  ? Other Shortness Of  Breath  ?  Household bleach- "takes my breath away"  ? Azithromycin Hives  ? Celexa [Citalopram Hydrobromide]   ?  Myalgias  ? Trintellix [Vortioxetine] Other (See Comments)  ?  Dysphoria   ? Vicodin [Hydrocodone-Acetaminophen] Nausea And Vomiting  ? Wellbutrin [Bupropion]   ?  anxiety  ? ? ?Current Outpatient Medications on File Prior to Visit  ?Medication Sig  ? albuterol (VENTOLIN HFA) 108 (90 Base) MCG/ACT inhaler USE 2 PUFFS EVERY 6 HOURS  AS NEEDED FOR SHORTNESS OF  BREATH  ? busPIRone (BUSPAR) 15 MG tablet Take on tablet three times a day for chronic anxiety.  ? Calcium Carb-Cholecalciferol (CALCIUM 1000 + D) 1000-20 MG-MCG TABS calcium  ? cholecalciferol (VITAMIN D) 1000 UNITS tablet Take 2,000 Units by mouth daily.   ? diltiazem (CARDIZEM CD) 120 MG 24 hr capsule Take 1 capsule Daily for BP  ? escitalopram (LEXAPRO) 20 MG tablet Take 1 tablet Daily for Mood  ? famotidine (PEPCID) 20 MG tablet TAKE 1 TABLET BY MOUTH TWICE A DAY  ? fluticasone-salmeterol (ADVAIR) 100-50 MCG/ACT AEPB Inhale 1 puff into the lungs 2 (two) times daily. Rinse mouth and gargle after each use to avoid thrush.  ? MAGNESIUM PO magnesium  ? Multiple Vitamin (MULTIVITAMIN) capsule Take 1 capsule by mouth daily.  ? Multiple Vitamins-Minerals (ZINC PO) zinc  ? psyllium (REGULOID) 0.52 g capsule Take 0.52 g by mouth daily.  ? rosuvastatin (CRESTOR) 10 MG tablet Take 1 tablet  Daily for Cholesterol  ? traMADol (ULTRAM) 50 MG tablet   ? azelastine (ASTELIN) 0.1 % nasal spray Place 2 sprays into both nostrils 2 (two) times daily. Use in each nostril as directed  ? ?No current facility-administered medications on file prior to visit.  ? ? ?ROS: all negative except what is noted in the HPI.  ? ?Physical Exam: ? ?BP 128/76   Pulse 76   Temp 97.9 ?F (36.6 ?C)   Resp 16   Ht '5\' 5"'$  (1.651 m)   Wt 197 lb 6.4 oz (89.5 kg)   SpO2 97%   BMI 32.85 kg/m?  ? ?General Appearance: NAD.  Awake, conversant and cooperative. ?Eyes: PERRLA, EOMs intact.   Sclera white.  Conjunctiva without erythema. ?Sinuses: No frontal/maxillary tenderness.  No nasal discharge. Nares patent.  ?ENT/Mouth: Ext aud canals clear.  Bilateral TMs w/DOL and without erythema or bulging. Hearing intact.  Posterior pharynx without swelling or exudate.  Tonsils without swelling or erythema.  ?Neck: Supple.  No masses, nodules or thyromegaly. ?Respiratory: Effort is regular with non-labored breathing. Breath sounds are equal bilaterally without rales, rhonchi, wheezing or stridor.  ?Cardio: RRR with no MRGs. Brisk peripheral pulses without edema.  ?Abdomen: Active BS in all four quadrants.  Soft and non-tender without guarding, rebound tenderness, hernias or masses. ?Lymphatics: Non tender without lymphadenopathy.  ?Musculoskeletal: Full ROM, 5/5 strength, normal ambulation.  No clubbing or cyanosis. ?Skin: Mid chest with x3, approximately 2 mm round, slightly raised, skin lesions.  Pink with mild discoloration.  Surrounding skin appropriate color for ethnicity. Warm. ?Neuro: CN II-XII grossly normal. Normal muscle tone without cerebellar symptoms and intact sensation.   ?Psych: AO X 3,  appropriate mood and affect, insight and judgment.  ?  ? ?Darrol Jump, NP ?2:42 PM ?[facility] ? ?

## 2021-07-03 ENCOUNTER — Encounter: Payer: Self-pay | Admitting: Adult Health

## 2021-07-17 ENCOUNTER — Telehealth: Payer: Self-pay

## 2021-07-17 NOTE — Telephone Encounter (Signed)
LM-07/17/21-Calling pt. To complete CCM to [facility] transfer.Unable to reach pt. [place]. ? ?Total time spent: 2 min. ?

## 2021-07-19 NOTE — Telephone Encounter (Signed)
LM-07/19/21-Calling pt. To complete CCM to [facility] transfer.Spoke to pt. And completed transfer.  ? ?Total time spent: 3 min. ?

## 2021-07-25 ENCOUNTER — Encounter: Payer: Self-pay | Admitting: Nurse Practitioner

## 2021-07-25 ENCOUNTER — Ambulatory Visit (INDEPENDENT_AMBULATORY_CARE_PROVIDER_SITE_OTHER): Payer: Medicare HMO | Admitting: Nurse Practitioner

## 2021-07-25 VITALS — BP 122/82 | HR 76 | Temp 97.9°F | Wt 191.6 lb

## 2021-07-25 DIAGNOSIS — K219 Gastro-esophageal reflux disease without esophagitis: Secondary | ICD-10-CM

## 2021-07-25 DIAGNOSIS — K760 Fatty (change of) liver, not elsewhere classified: Secondary | ICD-10-CM

## 2021-07-25 DIAGNOSIS — J449 Chronic obstructive pulmonary disease, unspecified: Secondary | ICD-10-CM | POA: Diagnosis not present

## 2021-07-25 DIAGNOSIS — I700 Atherosclerosis of aorta: Secondary | ICD-10-CM | POA: Diagnosis not present

## 2021-07-25 DIAGNOSIS — E669 Obesity, unspecified: Secondary | ICD-10-CM | POA: Diagnosis not present

## 2021-07-25 DIAGNOSIS — N029 Recurrent and persistent hematuria with unspecified morphologic changes: Secondary | ICD-10-CM

## 2021-07-25 DIAGNOSIS — E559 Vitamin D deficiency, unspecified: Secondary | ICD-10-CM

## 2021-07-25 DIAGNOSIS — L988 Other specified disorders of the skin and subcutaneous tissue: Secondary | ICD-10-CM

## 2021-07-25 DIAGNOSIS — R69 Illness, unspecified: Secondary | ICD-10-CM | POA: Diagnosis not present

## 2021-07-25 DIAGNOSIS — F17200 Nicotine dependence, unspecified, uncomplicated: Secondary | ICD-10-CM | POA: Diagnosis not present

## 2021-07-25 DIAGNOSIS — Z79899 Other long term (current) drug therapy: Secondary | ICD-10-CM

## 2021-07-25 DIAGNOSIS — F3341 Major depressive disorder, recurrent, in partial remission: Secondary | ICD-10-CM

## 2021-07-25 DIAGNOSIS — E785 Hyperlipidemia, unspecified: Secondary | ICD-10-CM

## 2021-07-25 DIAGNOSIS — I10 Essential (primary) hypertension: Secondary | ICD-10-CM | POA: Diagnosis not present

## 2021-07-25 DIAGNOSIS — E66811 Obesity, class 1: Secondary | ICD-10-CM

## 2021-07-25 DIAGNOSIS — R220 Localized swelling, mass and lump, head: Secondary | ICD-10-CM

## 2021-07-25 DIAGNOSIS — F411 Generalized anxiety disorder: Secondary | ICD-10-CM

## 2021-07-25 MED ORDER — FAMOTIDINE 20 MG PO TABS: 20.0000 mg | ORAL_TABLET | Freq: Two times a day (BID) | ORAL | 1 refills | Status: DC

## 2021-07-25 NOTE — Progress Notes (Signed)
FOLLOW UP ? ?Assessment and Plan:  ? ?1. Primary hypertension ?Well controlled with current medications  ?Monitor blood pressure at home; patient to call if consistently greater than 130/80 ?Continue DASH diet.   ?Reminder to go to the ER if any CP, SOB, nausea, dizziness, severe HA, changes vision/speech, left arm numbness and tingling and jaw pain. ? ?- CBC with Differential/Platelet ? ?2. Gastroesophageal reflux disease, unspecified whether esophagitis present ?Currently well controlled on Pepcid. ?Continue to avoid triggers. ? ?- famotidine (PEPCID) 20 MG tablet; Take 1 tablet (20 mg total) by mouth 2 (two) times daily.  Dispense: 180 tablet; Refill: 1 ? ?3. Chronic obstructive pulmonary disease, unspecified COPD type ([place]) ?Continue Advair, Albuterol ?Discussed smoking cessation - patient not ready to quit. ?Continue to monitor. ? ?4. Aortic atherosclerosis ([place]) - CT 09/2020 ?Cholesterol currently at goal. ?Continue low cholesterol diet and exercise.  ?Discussed smoking cessation. ?Continue to monitor. ? ?5. Recurrent major depressive disorder, in partial remission ([place]) ?Stable with no new events. ?Continue Buspar, Lexapro.  ?Continue adequate sleep, remain active, eat a well balanced diet focusing on fruits and vegetables. ?Continue to monitor. ? ? ?6. Anxiety state ?Stable with no new events. ?Continue Buspar, Lexapro.  ?Continue adequate sleep, remain active, eat a well balanced diet focusing on fruits and vegetables. ?Continue to monitor. ? ?7. Hyperlipidemia, unspecified hyperlipidemia type ?Currently at goal. ?Continue Rosuvastatin. ? ?- Lipid panel ? ?8. Hepatic steatosis ?No new events. ?Continue to monitor. ? ?- COMPLETE METABOLIC PANEL WITH GFR ? ?9. Vitamin D deficiency ?Currently at goal. ?Continue Vitamin D ?Continue to monitor. ? ?10. Benign hematuria ?No new events. ?Continue to  monitor. ? ?- CBC with Differential/Platelet ?- Urinalysis w microscopic + reflex cultur ? ?11. Obesity (BMI  30.0-34.9) ?Has lost 7 lb in one month. ?Continue lifestyle modifications. ? ?- CBC with Differential/Platelet ?- COMPLETE METABOLIC PANEL WITH GFR ? ?12. Smoker ?Patient not ready to quit. ?Smoking cessation instruction/counseling given:  counseled patient on the dangers of tobacco use, advised patient to stop smoking, and reviewed strategies to maximize success ? ?- CBC with Differential/Platelet ?- COMPLETE METABOLIC PANEL WITH GFR ? ?13. Nodule of skin of head ?Refer to Dermatology for annual skin check and further evaluation. ? ?- Ambulatory referral to Dermatology ? ?14. Skin lesion of breast ?Refer to Dermatology for annual skin check and further evaluation. ? ?- Ambulatory referral to Dermatology ? ?15. Medication management ?All medications discussed and reviewed in detail.   ?All questions and concerns addressed. ? ?Continue diet and meds as discussed. Further disposition pending results of labs. Discussed med's effects and SE's.   ?Over 30 minutes of exam, counseling, chart review, and critical decision making was performed.  ? ?Future Appointments  ?Date Time Provider [place]  ?01/29/2022  2:00 PM Liane Comber, NP [facility] None  ?07/26/2022 11:00 AM Darrol Jump, NP [facility] None  ? ? ?---------------------------------------------------------------------------------------------------------------------- ? ?HPI ?61 y.o. female  presents for 3 month follow up on hypertension, cholesterol, diabetes, weight and vitamin D deficiency.  ? ?BMI is Body mass index is 31.88 kg/m?., she has not been working on diet and exercise.  She is no longer drinking sweet tea on a daily basis, only when out to eat.  She has lost 7 lb in 1 mo. ?Wt Readings from Last 3 Encounters:  ?07/25/21 191 lb 9.6 oz (86.9 kg)  ?06/14/21 197 lb 6.4 oz (89.5 kg)  ?05/29/21 196 lb 6.4 oz (89.1 kg)  ? ? ?Her blood pressure has been controlled  at home, today their BP is BP: 122/82 ? She does not workout. She denies chest  pain, shortness of breath, dizziness. ? ? She is on cholesterol medication Rosuvastatin and denies myalgias. Her cholesterol is at goal. The cholesterol last visit was:   ?Lab Results  ?Component Value Date  ? CHOL 120 01/25/2021  ? HDL 59 01/25/2021  ? [place] 46 01/25/2021  ? TRIG 66 01/25/2021  ? CHOLHDL 2.0 01/25/2021  ? ? She has not been working on diet and exercise for prediabetes, and denies hyperglycemia and hypoglycemia . Last A1C in the office was:  ?Lab Results  ?Component Value Date  ? HGBA1C 5.3 01/25/2021  ? ?Patient is on Vitamin D supplement.   ?Lab Results  ?Component Value Date  ? VD25OH 54 01/25/2021  ?   ? ? ? ?Current Medications:  ?Current Outpatient Medications on File Prior to Visit  ?Medication Sig  ? Acetaminophen (TYLENOL ARTHRITIS PAIN PO) Take by mouth.  ? albuterol (VENTOLIN HFA) 108 (90 Base) MCG/ACT inhaler USE 2 PUFFS EVERY 6 HOURS  AS NEEDED FOR SHORTNESS OF  BREATH  ? busPIRone (BUSPAR) 15 MG tablet Take on tablet three times a day for chronic anxiety.  ? cholecalciferol (VITAMIN D) 1000 UNITS tablet Take 2,000 Units by mouth daily.   ? diltiazem (CARDIZEM CD) 120 MG 24 hr capsule Take 1 capsule Daily for BP  ? escitalopram (LEXAPRO) 20 MG tablet Take 1 tablet Daily for Mood  ? famotidine (PEPCID) 20 MG tablet TAKE 1 TABLET BY MOUTH TWICE A DAY  ? fluticasone-salmeterol (ADVAIR) 100-50 MCG/ACT AEPB Inhale 1 puff into the lungs 2 (two) times daily. Rinse mouth and gargle after each use to avoid thrush.  ? MAGNESIUM PO magnesium  ? Multiple Vitamin (MULTIVITAMIN) capsule Take 1 capsule by mouth daily.  ? Multiple Vitamins-Minerals (ZINC PO) zinc  ? rosuvastatin (CRESTOR) 10 MG tablet Take 1 tablet Daily for Cholesterol  ? azelastine (ASTELIN) 0.1 % nasal spray Place 2 sprays into both nostrils 2 (two) times daily. Use in each nostril as directed  ? Calcium Carb-Cholecalciferol (CALCIUM 1000 + D) 1000-20 MG-MCG TABS calcium (Patient not taking: Reported on 07/25/2021)  ? psyllium  (REGULOID) 0.52 g capsule Take 0.52 g by mouth daily. (Patient not taking: Reported on 07/25/2021)  ? traMADol (ULTRAM) 50 MG tablet  (Patient not taking: Reported on 07/25/2021)  ? ?No current facility-administered medications on file prior to visit.  ? ? ? ?Allergies:  ?Allergies  ?Allergen Reactions  ? Other Shortness Of Breath  ?  Household bleach- "takes my breath away"  ? Azithromycin Hives  ? Celexa [Citalopram Hydrobromide]   ?  Myalgias  ? Trintellix [Vortioxetine] Other (See Comments)  ?  Dysphoria   ? Vicodin [Hydrocodone-Acetaminophen] Nausea And Vomiting  ? Wellbutrin [Bupropion]   ?  anxiety  ?  ? ?Medical History:  ?Past Medical History:  ?Diagnosis Date  ? Allergy   ? Arthritis   ? Asthma   ? Benign hematuria 2004  ? Depression   ? Hyperlipidemia   ? Hypertension   ? TMJ (temporomandibular joint syndrome)   ? Vitamin D deficiency   ? ?Family history- Reviewed and unchanged ?Social history- Reviewed and unchanged ? ? ?Review of Systems:  ?ROS ? ? ? ?Physical Exam: ?BP 122/82   Pulse 76   Temp 97.9 ?F (36.6 ?C)   Wt 191 lb 9.6 oz (86.9 kg)   SpO2 96%   BMI 31.88 kg/m?  ?Wt Readings from Last 3  Encounters:  ?07/25/21 191 lb 9.6 oz (86.9 kg)  ?06/14/21 197 lb 6.4 oz (89.5 kg)  ?05/29/21 196 lb 6.4 oz (89.1 kg)  ? ?General Appearance: Well nourished, in no apparent distress. ?Eyes: PERRLA, EOMs, conjunctiva no swelling or erythema ?Sinuses: No Frontal/maxillary tenderness ?ENT/Mouth: Ext aud canals clear, TMs without erythema, bulging. No erythema, swelling, or exudate on post pharynx.  Tonsils not swollen or erythematous. Hearing normal.  ?Neck: Supple, thyroid normal.  ?Respiratory: Respiratory effort normal, BS equal bilaterally without rales, rhonchi, wheezing or stridor.  ?Cardio: RRR with no MRGs. Brisk peripheral pulses without edema.  ?Abdomen: Soft, + BS.  Non tender, no guarding, rebound, hernias, masses. ?Lymphatics: Non tender without lymphadenopathy.  ?Musculoskeletal: Full ROM, 5/5  strength, Normal gait ?Skin: Warm, dry without rashes, lesions, ecchymosis.  ?Neuro: Cranial nerves intact. No cerebellar symptoms.  ?Psych: Awake and oriented X 3, normal affect, Insight and Judgment appropriate.  ? ? ?Nicole Phelps C

## 2021-07-25 NOTE — Patient Instructions (Signed)

## 2021-07-26 LAB — URINALYSIS W MICROSCOPIC + REFLEX CULTURE
Bacteria, UA: NONE SEEN /HPF
Bilirubin Urine: NEGATIVE
Glucose, UA: NEGATIVE
Hyaline Cast: NONE SEEN /LPF
Ketones, ur: NEGATIVE
Leukocyte Esterase: NEGATIVE
Nitrites, Initial: NEGATIVE
Protein, ur: NEGATIVE
Specific Gravity, Urine: 1.007 (ref 1.001–1.035)
WBC, UA: NONE SEEN /HPF (ref 0–5)
pH: 6.5 (ref 5.0–8.0)

## 2021-07-26 LAB — CBC WITH DIFFERENTIAL/PLATELET
Absolute Monocytes: 618 cells/uL (ref 200–950)
Basophils Absolute: 48 cells/uL (ref 0–200)
Basophils Relative: 0.5 %
Eosinophils Absolute: 133 cells/uL (ref 15–500)
Eosinophils Relative: 1.4 %
HCT: 44.5 % (ref 35.0–45.0)
Hemoglobin: 14.9 g/dL (ref 11.7–15.5)
Lymphs Abs: 2043 cells/uL (ref 850–3900)
MCH: 30.6 pg (ref 27.0–33.0)
MCHC: 33.5 g/dL (ref 32.0–36.0)
MCV: 91.4 fL (ref 80.0–100.0)
MPV: 11 fL (ref 7.5–12.5)
Monocytes Relative: 6.5 %
Neutro Abs: 6660 cells/uL (ref 1500–7800)
Neutrophils Relative %: 70.1 %
Platelets: 321 10*3/uL (ref 140–400)
RBC: 4.87 10*6/uL (ref 3.80–5.10)
RDW: 12.7 % (ref 11.0–15.0)
Total Lymphocyte: 21.5 %
WBC: 9.5 10*3/uL (ref 3.8–10.8)

## 2021-07-26 LAB — COMPLETE METABOLIC PANEL WITH GFR
AG Ratio: 1.8 (calc) (ref 1.0–2.5)
ALT: 18 U/L (ref 6–29)
AST: 16 U/L (ref 10–35)
Albumin: 4.5 g/dL (ref 3.6–5.1)
Alkaline phosphatase (APISO): 96 U/L (ref 37–153)
BUN: 12 mg/dL (ref 7–25)
CO2: 26 mmol/L (ref 20–32)
Calcium: 9.9 mg/dL (ref 8.6–10.4)
Chloride: 106 mmol/L (ref 98–110)
Creat: 0.87 mg/dL (ref 0.50–1.05)
Globulin: 2.5 g/dL (calc) (ref 1.9–3.7)
Glucose, Bld: 86 mg/dL (ref 65–99)
Potassium: 4.3 mmol/L (ref 3.5–5.3)
Sodium: 142 mmol/L (ref 135–146)
Total Bilirubin: 0.5 mg/dL (ref 0.2–1.2)
Total Protein: 7 g/dL (ref 6.1–8.1)
eGFR: 76 mL/min/{1.73_m2} (ref >=60)

## 2021-07-26 LAB — LIPID PANEL
Cholesterol: 148 mg/dL (ref <200)
HDL: 57 mg/dL (ref >=50)
LDL Cholesterol (Calc): 68 mg/dL (calc)
Non-HDL Cholesterol (Calc): 91 mg/dL (calc) (ref <130)
Total CHOL/HDL Ratio: 2.6 (calc) (ref <5.0)
Triglycerides: 142 mg/dL (ref <150)

## 2021-07-26 LAB — NO CULTURE INDICATED

## 2021-08-21 ENCOUNTER — Telehealth: Payer: Medicare HMO | Admitting: Pharmacy Technician

## 2021-08-29 ENCOUNTER — Encounter: Payer: Self-pay | Admitting: Nurse Practitioner

## 2021-11-17 IMAGING — DX DG CHEST 2V
2 series · 2 of 2 positions shown · non-contrast
Comparison: 02/12/2019

CLINICAL DATA: Smoker, hypertension, [id number] three weeks ago, some
chest tightness persists

EXAM:
CHEST - 2 VIEW

[dg chest 2 view (1 of 2)]
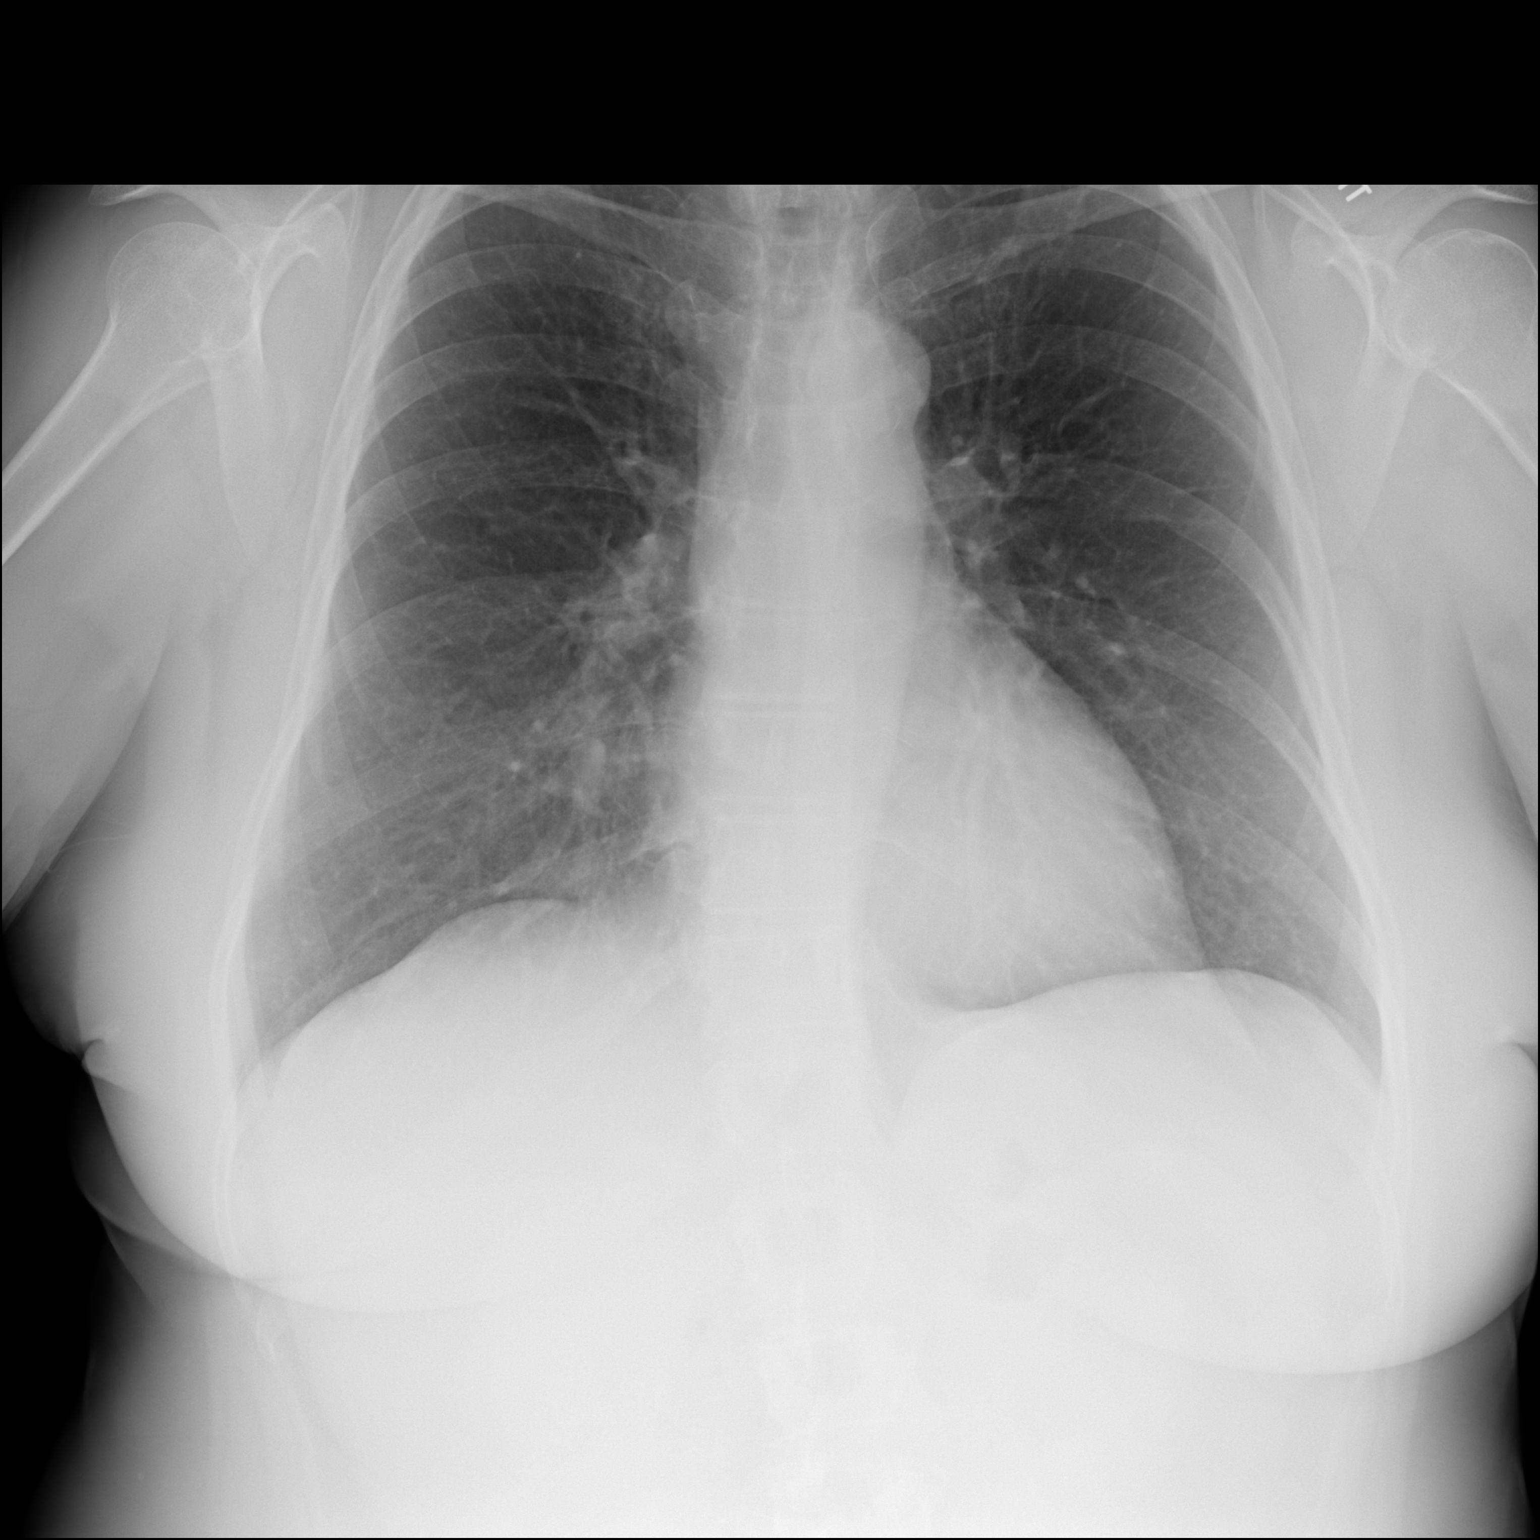

[dg chest 2 view (2 of 2)]
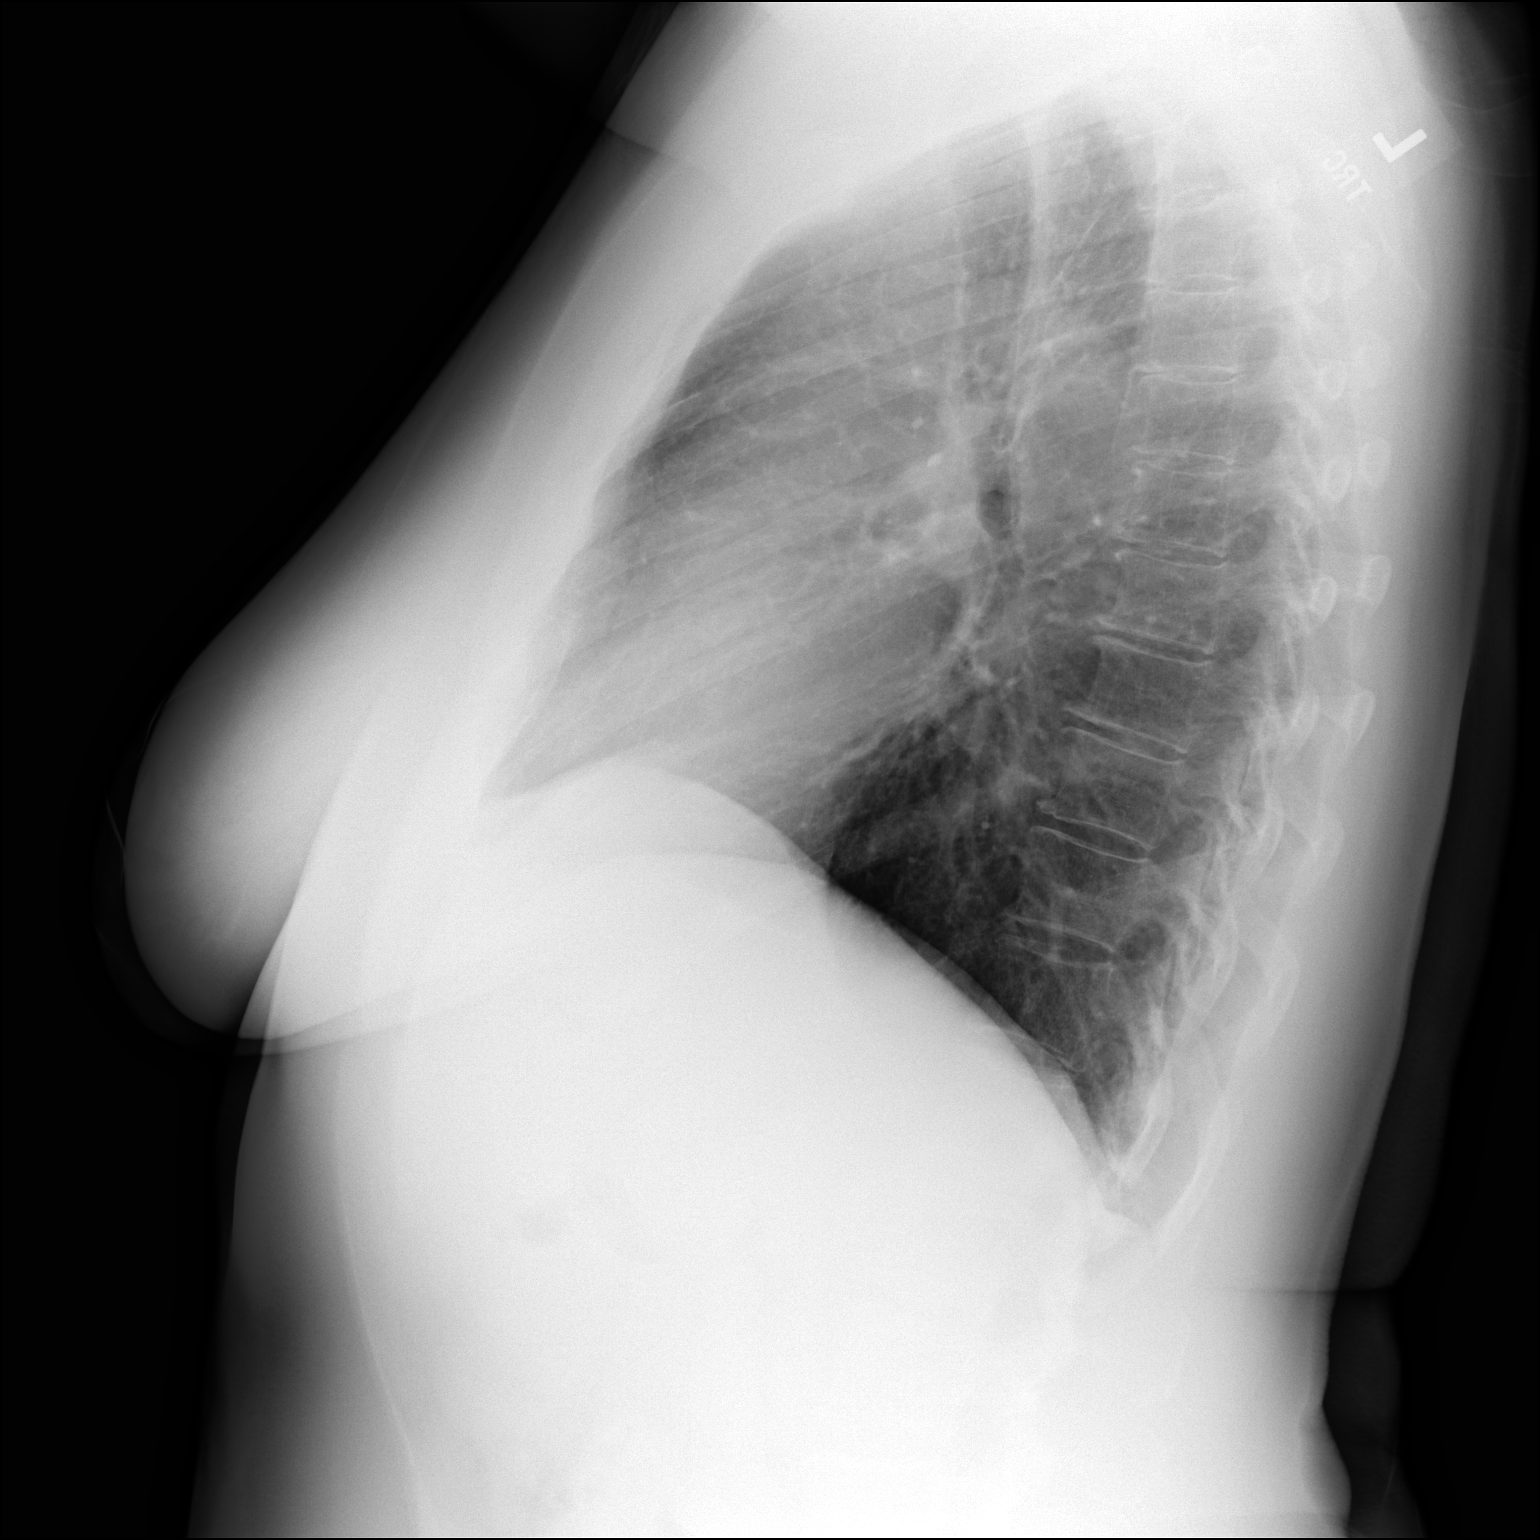

[2 of 2 positions shown; findings below may reference images not displayed]

FINDINGS: Normal heart size, mediastinal contours, and pulmonary vascularity.

Lungs clear.

No pleural effusion or pneumothorax.

Bones unremarkable.
IMPRESSION: Normal exam.

## 2022-01-29 ENCOUNTER — Ambulatory Visit (INDEPENDENT_AMBULATORY_CARE_PROVIDER_SITE_OTHER): Payer: Medicare HMO | Admitting: Nurse Practitioner

## 2022-01-29 ENCOUNTER — Encounter: Payer: Self-pay | Admitting: Nurse Practitioner

## 2022-01-29 VITALS — BP 130/80 | HR 79 | Temp 97.9°F | Resp 16 | Ht 65.0 in | Wt 190.8 lb

## 2022-01-29 DIAGNOSIS — E785 Hyperlipidemia, unspecified: Secondary | ICD-10-CM

## 2022-01-29 DIAGNOSIS — F17200 Nicotine dependence, unspecified, uncomplicated: Secondary | ICD-10-CM

## 2022-01-29 DIAGNOSIS — Z1329 Encounter for screening for other suspected endocrine disorder: Secondary | ICD-10-CM

## 2022-01-29 DIAGNOSIS — E559 Vitamin D deficiency, unspecified: Secondary | ICD-10-CM

## 2022-01-29 DIAGNOSIS — Z0001 Encounter for general adult medical examination with abnormal findings: Secondary | ICD-10-CM

## 2022-01-29 DIAGNOSIS — F3341 Major depressive disorder, recurrent, in partial remission: Secondary | ICD-10-CM

## 2022-01-29 DIAGNOSIS — Z79899 Other long term (current) drug therapy: Secondary | ICD-10-CM

## 2022-01-29 DIAGNOSIS — J449 Chronic obstructive pulmonary disease, unspecified: Secondary | ICD-10-CM

## 2022-01-29 DIAGNOSIS — Z1389 Encounter for screening for other disorder: Secondary | ICD-10-CM

## 2022-01-29 DIAGNOSIS — F411 Generalized anxiety disorder: Secondary | ICD-10-CM

## 2022-01-29 DIAGNOSIS — K760 Fatty (change of) liver, not elsewhere classified: Secondary | ICD-10-CM

## 2022-01-29 DIAGNOSIS — I10 Essential (primary) hypertension: Secondary | ICD-10-CM

## 2022-01-29 DIAGNOSIS — Z136 Encounter for screening for cardiovascular disorders: Secondary | ICD-10-CM | POA: Diagnosis not present

## 2022-01-29 DIAGNOSIS — E669 Obesity, unspecified: Secondary | ICD-10-CM

## 2022-01-29 DIAGNOSIS — R7309 Other abnormal glucose: Secondary | ICD-10-CM

## 2022-01-29 DIAGNOSIS — Z0000 Encounter for general adult medical examination without abnormal findings: Secondary | ICD-10-CM | POA: Diagnosis not present

## 2022-01-29 DIAGNOSIS — R066 Hiccough: Secondary | ICD-10-CM

## 2022-01-29 DIAGNOSIS — E538 Deficiency of other specified B group vitamins: Secondary | ICD-10-CM

## 2022-01-29 NOTE — Patient Instructions (Signed)

## 2022-01-29 NOTE — Progress Notes (Signed)
COMPLETE PHYSICAL  Assessment and Plan:  Nicole Phelps was seen today for annual exam.  Diagnoses and all orders for this visit:  Encounter for Annual Physical Exam with abnormal findings Due annually  Health Maintenance reviewed Healthy lifestyle reviewed and goals set  Essential hypertension Discussed DASH (Dietary Approaches to Stop Hypertension) DASH diet is lower in sodium than a typical American diet. Cut back on foods that are high in saturated fat, cholesterol, and trans fats. Eat more whole-grain foods, fish, poultry, and nuts Remain active and exercise as tolerated daily.  Monitor BP at home-Call if greater than 130/80.  Check CMP/CBC   Hyperlipidemia, unspecified hyperlipidemia type Discussed lifestyle modifications. Recommended diet heavy in fruits and veggies, omega 3's. Decrease consumption of animal meats, cheeses, and dairy products. Remain active and exercise as tolerated. Continue to monitor. Check lipids/TSH   Recurrent major depressive disorder, in partial remission (HCC) Continue medications: Escitalopram '20mg'$  daily Discussed stress management techniques  Discussed, increase water,intake & good sleep hygiene  Discussed increasing exercise & vegetables in diet  Anxiety state Managing well at this time Stress management techniques discussed, increase water, good sleep hygiene discussed, increase exercise, and increase veggies.   Chronic obstructive pulmonary disease, unspecified COPD type ([place]) Mild intermittent asthma without complication No triggers, well controlled symptoms, cont to monitor STOP SMOKING Continue Advair BID, rinse mouth after use  Smoker Discussed smoking cessation, benefits Discussed health risks of continued behavior Not ready to quit at this time Will continue to assess readiness  UTD annual screening, last 09/2020, repeat 1 year  B12 deficiency -     Vitamin B12  Vitamin D deficiency Continue supplementation to maintain  goal of 70-100 Taking Vitamin D 2,000 IU daily Defer vitamin D level -     VITAMIN D 25 Hydroxy (Vit-D Deficiency, Fractures)  Abnormal glucose -     Hemoglobin A1c   Discussed dietary and exercise modifications  Hepatic steatosis Per CT 02/12/20  Severe obesity (BMI 35.0-39.9) with comorbidity (Nicole Phelps) Discussed dietary and exercise modifications  Screening for thyroid disorder -     TSH  Screening for blood or protein in urine -     Urinalysis w microscopic + reflex cultur -     Microalbumin / creatinine urine ratio  Screening, ischemic heart disease      -      EKG   Abnormal vaginal PAP/ ASCUS - PAP + HPV, if similar try topical estrogen and repeat 6 months - if remains persistent will refer to GYN   Chronic hiccups Refer to GI for further evaluation of chronic hiccups.   Refer to Dermatology Referred 07/2021 - no follow thorugh Overall skin check   No orders of the defined types were placed in this encounter.   Discussed med's effects and SE's. Screening labs and tests as requested with regular follow-up as recommended. Future Appointments  Date Time Provider [place]  01/30/2023  2:00 PM Darrol Jump, NP [facility] None    HPI 61 y.o. female  presents for a complete physical. She has Hypertension; Hyperlipidemia; Depression; Vitamin D deficiency; Obesity (BMI 30.0-34.9); Smoker; Asthma; Benign hematuria; B12 deficiency; Chronic obstructive pulmonary disease ([place]); Mild CAD; Anxiety state; Aortic atherosclerosis ([place]) - CT 09/2020; Osteopenia of necks of both femurs; Hepatic steatosis; and Abnormal Pap smear of vagina on their problem list.  She is married, 2 children, 9 grandgrands. She is retired Engineer, water.   She is a smoker (22.5 pack year history), Has COPD/asthma and uses Advair and albuterol. She  had normal CXR 04/2020. Insurance changed and won't cover advair, will check for preferred agent. She had low dose lung cancer screening 09/13/2020 that was  benign, recommended annual follow up.   She was seen last visit for multitude of symptoms, including nausea/AB pain, had negative AB US/negative HIDA, saw Dr. Tarri Glenn, started PPI course and resolved. Now transitioned to famotidine and reports continues to do well.   BMI is Body mass index is 31.75 kg/m., she admits not doing too well on diet She does walk daily. Will do frozen veggie. Drinks diet mountain dew, 3 cans over ice. 3 full bottles of water.  Wt Readings from Last 3 Encounters:  01/29/22 190 lb 12.8 oz (86.5 kg)  07/25/21 191 lb 9.6 oz (86.9 kg)  06/14/21 197 lb 6.4 oz (89.5 kg)   Her blood pressure has been controlled at home, today their BP is BP: 130/80 She does workout, walks daily.  She denies chest pain, shortness of breath, dizziness.   She had Ct coronary 02/2018 that showed mild disease, medically treated.  Aortic atherosclerosis per CT 09/2020.   She is on cholesterol medication, rosuvastatin 10 mg daily and denies myalgias. Her cholesterol is at goal. The cholesterol last visit was:   Lab Results  Component Value Date   CHOL 148 07/25/2021   HDL 57 07/25/2021   LDLCALC 68 07/25/2021   TRIG 142 07/25/2021   CHOLHDL 2.6 07/25/2021   Last A1C in the office was:  Lab Results  Component Value Date   HGBA1C 5.3 01/25/2021    Lab Results  Component Value Date   GFRNONAA 63 08/23/2020   B12 was  Lab Results  Component Value Date   VITAMINB12 370 01/25/2021   Patient is on Vitamin D supplement, 5000IU   Lab Results  Component Value Date   VD25OH 54 01/25/2021    Current Medications:    Current Outpatient Medications (Cardiovascular):  .  diltiazem (CARDIZEM CD) 120 MG 24 hr capsule, Take 1 capsule Daily for BP .  rosuvastatin (CRESTOR) 10 MG tablet, Take 1 tablet Daily for Cholesterol  Current Outpatient Medications (Respiratory):  .  albuterol (VENTOLIN HFA) 108 (90 Base) MCG/ACT inhaler, USE 2 PUFFS EVERY 6 HOURS  AS NEEDED FOR SHORTNESS OF   BREATH .  fluticasone-salmeterol (ADVAIR) 100-50 MCG/ACT AEPB, Inhale 1 puff into the lungs 2 (two) times daily. Rinse mouth and gargle after each use to avoid thrush. Marland Kitchen  azelastine (ASTELIN) 0.1 % nasal spray, Place 2 sprays into both nostrils 2 (two) times daily. Use in each nostril as directed  Current Outpatient Medications (Analgesics):  Marland Kitchen  Acetaminophen (TYLENOL ARTHRITIS PAIN PO), Take by mouth. .  traMADol (ULTRAM) 50 MG tablet,    Current Outpatient Medications (Other):  .  busPIRone (BUSPAR) 15 MG tablet, Take on tablet three times a day for chronic anxiety. .  Calcium Carb-Cholecalciferol (CALCIUM 1000 + D) 1000-20 MG-MCG TABS,  .  cholecalciferol (VITAMIN D) 1000 UNITS tablet, Take 2,000 Units by mouth daily.  Marland Kitchen  escitalopram (LEXAPRO) 20 MG tablet, Take 1 tablet Daily for Mood .  famotidine (PEPCID) 20 MG tablet, Take 1 tablet (20 mg total) by mouth 2 (two) times daily. Marland Kitchen  MAGNESIUM PO, magnesium .  Multiple Vitamin (MULTIVITAMIN) capsule, Take 1 capsule by mouth daily. .  Multiple Vitamins-Minerals (ZINC PO), zinc .  psyllium (REGULOID) 0.52 g capsule, Take 0.52 g by mouth daily.   Health Maintenance:   Immunization History  Administered Date(s) Administered  . Influenza Inj  Mdck Quad Pf 12/13/2017  . Influenza,inj,Quad PF,6+ Mos 12/18/2018  . Influenza-Unspecified 12/30/2020  . Moderna Sars-Covid-2 Vaccination 02/20/2020  . PFIZER Comirnaty(Gray Top)Covid-19 Tri-Sucrose Vaccine 05/27/2019, 06/17/2019, 01/15/2022  . PFIZER(Purple Top)SARS-COV-2 Vaccination 10/22/2020  . Pneumococcal-Unspecified 04/03/2009  . Td 04/03/2004, 01/25/2021  . Zoster Recombinat (Shingrix) 06/18/2020, 08/20/2020   TDAP: 01/2022 Pneumovax: 2011 Prevnar 20: due at age 70 Flu vaccine: 2023 Shingrix: has had, pending dates for record  Covid19: 2/2, pfizer + 2 boosters  Pap: Total abdominal hysterectomy, PAP 2020 with ASCUS was recommended 1 year follow up OVERDUE TODAY  MGM:  05/11/2021 DEXA: 05/2020, T - 1.4 bil fem neck Hearing aides   Colonoscopy: 08/2017 polyps due 5 years Due 2024 EGD: N/A  Eye exam: Eye care center in [place], last 2023, glasses Dentist: Dr. Barney Drain, 2023 q 6 months Derm: remote  Medical History:  Past Medical History:  Diagnosis Date  . Allergy   . Arthritis   . Asthma   . Benign hematuria 2004  . Depression   . Hyperlipidemia   . Hypertension   . TMJ (temporomandibular joint syndrome)   . Vitamin D deficiency    Allergies Allergies  Allergen Reactions  . Other Shortness Of Breath    Household bleach- "takes my breath away"  . Azithromycin Hives  . Celexa [Citalopram Hydrobromide]     Myalgias  . Trintellix [Vortioxetine] Other (See Comments)    Dysphoria   . Vicodin [Hydrocodone-Acetaminophen] Nausea And Vomiting  . Wellbutrin [Bupropion]     anxiety   SURGICAL HISTORY She  has a past surgical history that includes Tubal ligation; Breast surgery (Left); Knee arthroscopy w/ debridement (Right, 11/2017); Ankle surgery (Left, 2017); Total knee arthroplasty (Right, 02/2019); Total abdominal hysterectomy (2003); and Blepharoplasty (Bilateral, 04/09/2021). FAMILY HISTORY Her family history includes Arthritis in her mother; Breast cancer (age of onset: 55) in her sister; Colon cancer in her maternal aunt, maternal aunt, and maternal grandmother; Colon cancer (age of onset: 69) in her mother; Heart attack in her paternal grandfather; Hypertension in her father; Stroke in her father. SOCIAL HISTORY She  reports that she has been smoking cigarettes. She started smoking about 46 years ago. She has a 22.50 pack-year smoking history. She has never used smokeless tobacco. She reports current alcohol use. She reports current drug use. Drug: Marijuana.  Review of Systems  Constitutional:  Negative for malaise/fatigue and weight loss.  HENT:  Negative for hearing loss and tinnitus.   Eyes:  Negative for blurred vision and  double vision.  Respiratory:  Negative for cough, sputum production, shortness of breath and wheezing.   Cardiovascular:  Negative for chest pain, palpitations, orthopnea, claudication, leg swelling and PND.  Gastrointestinal:  Negative for abdominal pain, blood in stool, constipation, diarrhea, heartburn, melena, nausea and vomiting.  Genitourinary: Negative.   Musculoskeletal:  Negative for falls, joint pain and myalgias.  Skin:  Negative for rash.  Neurological:  Negative for dizziness, tingling, sensory change, weakness and headaches. Speech change: for. Endo/Heme/Allergies:  Negative for polydipsia.  Psychiatric/Behavioral: Negative.  Negative for depression, memory loss, substance abuse and suicidal ideas. The patient is not nervous/anxious and does not have insomnia.   All other systems reviewed and are negative.   Physical Exam: Estimated body mass index is 31.75 kg/m as calculated from the following:   Height as of this encounter: '5\' 5"'$  (1.651 m).   Weight as of this encounter: 190 lb 12.8 oz (86.5 kg). BP 130/80   Pulse 79   Temp 97.9  F (36.6 C)   Resp 16   Ht '5\' 5"'$  (1.651 m)   Wt 190 lb 12.8 oz (86.5 kg)   SpO2 97%   BMI 31.75 kg/m  General Appearance: Well nourished, in no apparent distress. Eyes: PERRLA, EOMs, conjunctiva no swelling or erythema Sinuses: No Frontal/maxillary tenderness ENT/Mouth: Ext aud canals clear, normal light reflex with TMs without erythema, bulging. Bilateral hearing aids. Good dentition. No erythema, swelling, or exudate on post pharynx. Tonsils not swollen or erythematous. Hearing normal. Crowded mouth.  Neck: Supple, thyroid normal. No bruits Respiratory: Respiratory effort normal, BS equal bilaterally without rales, rhonchi, wheezing or stridor. Cardio: RRR without murmurs, rubs or gallops. Brisk peripheral pulses without edema.  Chest: symmetric, with normal excursions and percussion. Breasts: Defer, getting mammogram Abdomen: Soft, +BS.  Non-tender, no guarding, rebound, hernias, masses, or organomegaly. .  Lymphatics: Non tender without lymphadenopathy.  Musculoskeletal: Full ROM all peripheral extremities,5/5 strength, and normal gait.  Skin: Warm, dry without rashes, lesions, ecchymosis.  R foot with 2 scabbed wounds dorsally, generalized mild swelling and pink/warm without distinct borders.   Breast: defer, mammogram annually, dense beasts, no new concerns Neuro: Cranial nerves intact, reflexes equal bilaterally. Normal muscle tone, no cerebellar symptoms. Sensation intact.  Psych: Awake and oriented X 3, normal affect, Insight and Judgment appropriate.  Pelvic: External genitalia:  no lesions              Urethra:  normal appearing urethra with no masses, tenderness or lesions              Bartholin's and Skene's: normal                 Vagina: atrophic vagina with normal color no lesions. Thick white discharge noted.              Cervix: Not present               Pap taken: vaginal wall specimen with brush Bimanual Exam: Adnexa: normal adnexa and no mass, fullness, tenderness, bilaterally.  No cervix palpated. Rectal exam: negative without mass, lesions or tenderness, sphincter tone normal, stool guaiac negative.           EKG: WNL no ST changes.    Darrol Jump, NP 2:15 PM [facility]

## 2022-01-30 LAB — HEMOGLOBIN A1C
Hgb A1c MFr Bld: 5.5 % of total Hgb (ref <5.7)
Mean Plasma Glucose: 111 mg/dL
eAG (mmol/L): 6.2 mmol/L

## 2022-01-30 LAB — CBC WITH DIFFERENTIAL/PLATELET
Absolute Monocytes: 621 cells/uL (ref 200–950)
Basophils Absolute: 68 cells/uL (ref 0–200)
Basophils Relative: 0.8 %
Eosinophils Absolute: 162 cells/uL (ref 15–500)
Eosinophils Relative: 1.9 %
HCT: 43.4 % (ref 35.0–45.0)
Hemoglobin: 14.7 g/dL (ref 11.7–15.5)
Lymphs Abs: 2669 cells/uL (ref 850–3900)
MCH: 31.1 pg (ref 27.0–33.0)
MCHC: 33.9 g/dL (ref 32.0–36.0)
MCV: 91.9 fL (ref 80.0–100.0)
MPV: 10.8 fL (ref 7.5–12.5)
Monocytes Relative: 7.3 %
Neutro Abs: 4981 cells/uL (ref 1500–7800)
Neutrophils Relative %: 58.6 %
Platelets: 308 10*3/uL (ref 140–400)
RBC: 4.72 10*6/uL (ref 3.80–5.10)
RDW: 12.2 % (ref 11.0–15.0)
Total Lymphocyte: 31.4 %
WBC: 8.5 10*3/uL (ref 3.8–10.8)

## 2022-01-30 LAB — URINALYSIS, ROUTINE W REFLEX MICROSCOPIC
Bilirubin Urine: NEGATIVE
Glucose, UA: NEGATIVE
Leukocytes,Ua: NEGATIVE
Nitrite: NEGATIVE
Specific Gravity, Urine: 1.022 (ref 1.001–1.035)
pH: 6 (ref 5.0–8.0)

## 2022-01-30 LAB — COMPLETE METABOLIC PANEL WITH GFR
AG Ratio: 2 (calc) (ref 1.0–2.5)
ALT: 13 U/L (ref 6–29)
AST: 14 U/L (ref 10–35)
Albumin: 4.6 g/dL (ref 3.6–5.1)
Alkaline phosphatase (APISO): 90 U/L (ref 37–153)
BUN: 13 mg/dL (ref 7–25)
CO2: 26 mmol/L (ref 20–32)
Calcium: 9.8 mg/dL (ref 8.6–10.4)
Chloride: 107 mmol/L (ref 98–110)
Creat: 0.89 mg/dL (ref 0.50–1.05)
Globulin: 2.3 g/dL (calc) (ref 1.9–3.7)
Glucose, Bld: 82 mg/dL (ref 65–99)
Potassium: 4 mmol/L (ref 3.5–5.3)
Sodium: 141 mmol/L (ref 135–146)
Total Bilirubin: 0.7 mg/dL (ref 0.2–1.2)
Total Protein: 6.9 g/dL (ref 6.1–8.1)
eGFR: 74 mL/min/{1.73_m2} (ref >=60)

## 2022-01-30 LAB — MICROALBUMIN / CREATININE URINE RATIO
Creatinine, Urine: 256 mg/dL (ref 20–275)
Microalb Creat Ratio: 6 mcg/mg creat (ref <30)
Microalb, Ur: 1.5 mg/dL

## 2022-01-30 LAB — INSULIN, RANDOM: Insulin: 12.9 u[IU]/mL

## 2022-01-30 LAB — LIPID PANEL
Cholesterol: 134 mg/dL (ref <200)
HDL: 60 mg/dL (ref >=50)
LDL Cholesterol (Calc): 54 mg/dL (calc)
Non-HDL Cholesterol (Calc): 74 mg/dL (calc) (ref <130)
Total CHOL/HDL Ratio: 2.2 (calc) (ref <5.0)
Triglycerides: 122 mg/dL (ref <150)

## 2022-01-30 LAB — MICROSCOPIC MESSAGE

## 2022-01-30 LAB — TSH: TSH: 2.22 mIU/L (ref 0.40–4.50)

## 2022-01-30 LAB — VITAMIN D 25 HYDROXY (VIT D DEFICIENCY, FRACTURES): Vit D, 25-Hydroxy: 48 ng/mL (ref 30–100)

## 2022-01-30 LAB — MAGNESIUM: Magnesium: 2 mg/dL (ref 1.5–2.5)

## 2022-02-04 ENCOUNTER — Other Ambulatory Visit: Payer: Self-pay

## 2022-02-04 MED ORDER — FLUTICASONE-SALMETEROL 100-50 MCG/ACT IN AEPB: 1.0000 | INHALATION_SPRAY | Freq: Two times a day (BID) | RESPIRATORY_TRACT | 3 refills | Status: DC

## 2022-02-05 ENCOUNTER — Encounter: Payer: Self-pay | Admitting: Nurse Practitioner

## 2022-02-13 ENCOUNTER — Telehealth: Payer: Self-pay | Admitting: Nurse Practitioner

## 2022-02-13 ENCOUNTER — Other Ambulatory Visit: Payer: Self-pay | Admitting: Nurse Practitioner

## 2022-02-13 DIAGNOSIS — F411 Generalized anxiety disorder: Secondary | ICD-10-CM

## 2022-02-13 MED ORDER — BUSPIRONE HCL 15 MG PO TABS: ORAL_TABLET | ORAL | 3 refills | Status: DC

## 2022-02-13 NOTE — Telephone Encounter (Signed)
PATIENT IS REQUESTING A REFILL ON BUSPIRONE 15 MG. 3 TIMES A DAY.  *[facility] [place]

## 2022-03-08 ENCOUNTER — Telehealth: Payer: Self-pay | Admitting: Nurse Practitioner

## 2022-03-08 DIAGNOSIS — I10 Essential (primary) hypertension: Secondary | ICD-10-CM

## 2022-03-08 DIAGNOSIS — E785 Hyperlipidemia, unspecified: Secondary | ICD-10-CM

## 2022-03-08 DIAGNOSIS — F411 Generalized anxiety disorder: Secondary | ICD-10-CM

## 2022-03-08 MED ORDER — DILTIAZEM HCL ER COATED BEADS 120 MG PO CP24: ORAL_CAPSULE | ORAL | 3 refills | Status: DC

## 2022-03-08 MED ORDER — ROSUVASTATIN CALCIUM 10 MG PO TABS: ORAL_TABLET | ORAL | 3 refills | Status: DC

## 2022-03-08 MED ORDER — ESCITALOPRAM OXALATE 20 MG PO TABS: ORAL_TABLET | ORAL | 3 refills | Status: DC

## 2022-03-08 NOTE — Addendum Note (Signed)
Addended by: Chancy Hurter on: 03/08/2022 10:48 AM   Modules accepted: Orders

## 2022-03-08 NOTE — Telephone Encounter (Signed)
Patient is needing refills on Rosuvastatin, Generic Lexapro, and Diltiazem. She is completely out of her blood pressure medication, states that the pharmacy "says" they have been trying to get in touch with [place] for a week but I don't see anything in epic where the pharmacy tried to request theses refills  [facility]

## 2022-03-27 DIAGNOSIS — Z86010 Personal history of colonic polyps: Secondary | ICD-10-CM | POA: Diagnosis not present

## 2022-03-27 DIAGNOSIS — Z800 Family history of malignant neoplasm of digestive organs: Secondary | ICD-10-CM | POA: Diagnosis not present

## 2022-03-27 DIAGNOSIS — R066 Hiccough: Secondary | ICD-10-CM | POA: Diagnosis not present

## 2022-03-27 DIAGNOSIS — R112 Nausea with vomiting, unspecified: Secondary | ICD-10-CM | POA: Diagnosis not present

## 2022-03-27 DIAGNOSIS — R1310 Dysphagia, unspecified: Secondary | ICD-10-CM | POA: Diagnosis not present

## 2022-04-15 DIAGNOSIS — K31A19 Gastric intestinal metaplasia without dysplasia, unspecified site: Secondary | ICD-10-CM | POA: Diagnosis not present

## 2022-04-15 DIAGNOSIS — D490 Neoplasm of unspecified behavior of digestive system: Secondary | ICD-10-CM | POA: Diagnosis not present

## 2022-04-15 DIAGNOSIS — K2270 Barrett's esophagus without dysplasia: Secondary | ICD-10-CM | POA: Diagnosis not present

## 2022-04-15 DIAGNOSIS — K449 Diaphragmatic hernia without obstruction or gangrene: Secondary | ICD-10-CM | POA: Diagnosis not present

## 2022-04-15 DIAGNOSIS — K222 Esophageal obstruction: Secondary | ICD-10-CM | POA: Diagnosis not present

## 2022-04-15 DIAGNOSIS — R066 Hiccough: Secondary | ICD-10-CM | POA: Diagnosis not present

## 2022-04-15 DIAGNOSIS — K3189 Other diseases of stomach and duodenum: Secondary | ICD-10-CM | POA: Diagnosis not present

## 2022-04-15 DIAGNOSIS — K209 Esophagitis, unspecified without bleeding: Secondary | ICD-10-CM | POA: Diagnosis not present

## 2022-04-15 DIAGNOSIS — R1310 Dysphagia, unspecified: Secondary | ICD-10-CM | POA: Diagnosis not present

## 2022-04-15 DIAGNOSIS — K208 Other esophagitis without bleeding: Secondary | ICD-10-CM | POA: Diagnosis not present

## 2022-04-19 ENCOUNTER — Other Ambulatory Visit: Payer: Self-pay | Admitting: Nurse Practitioner

## 2022-04-19 DIAGNOSIS — K219 Gastro-esophageal reflux disease without esophagitis: Secondary | ICD-10-CM

## 2022-05-05 ENCOUNTER — Encounter: Payer: Self-pay | Admitting: Internal Medicine

## 2022-05-05 DIAGNOSIS — R7309 Other abnormal glucose: Secondary | ICD-10-CM | POA: Insufficient documentation

## 2022-05-05 NOTE — Progress Notes (Unsigned)
Future Appointments  Date Time Provider Department  05/06/2022  9:30 AM Unk Pinto, MD [facility]  01/30/2023  2:00 PM Darrol Jump, NP [facility]    History of Present Illness:       This very nice 62 y.o. MWF  presents for 3 month follow up with HTN, HLD, Vit B12 Deficiency,  abn glucose and Vitamin D Deficiency.         Patient is treated for HTN  since 2020  & BP has been controlled at home. Today's BP was initially elevated & rechecked at goal - 160/80. Patient has had no complaints of any cardiac type chest pain, palpitations, dyspnea / orthopnea / PND, dizziness, claudication, or dependent edema.        Hyperlipidemia is controlled with diet & Rosuvastatin . Patient denies myalgias or other med SE's. Last Lipids were at goal :  Lab Results  Component Value Date   CHOL 134 01/29/2022   HDL 60 01/29/2022   LDLCALC 54 01/29/2022   TRIG 122 01/29/2022   CHOLHDL 2.2 01/29/2022      Also, the patient is followed expectantly for glucose intolerance and has had no symptoms of reactive hypoglycemia, diabetic polys, paresthesias or visual blurring.  Last A1c was normal & at goal :  Lab Results  Component Value Date   HGBA1C 5.5 01/29/2022          Patient has hx/o Vitamin B12 Deficiency (295 in 2014) .                                                     Further, the patient also has history of Vitamin D Deficiency  ("27" /2020 and "31" /2021) and supplements vitamin D . Last vitamin D was still low (goal is 70-100) :  Lab Results  Component Value Date   VD25OH 48 01/29/2022      Current Outpatient Medications on File Prior to Visit  Medication Sig   Acetaminophen (TYLENOL ARTHRITIS PAIN ) Take    albuterol HFA  inhaler USE 2 PUFFS EVERY 6 HOURS  AS NEEDED    azelastine  0.1 % nasal spray Place 2 sprays into both nostrils 2 (two) times daily   busPIRone 15 MG tablet Take on tablet three times a day for chronic anxiety.   Calcium -Cholecalciferol  (CALCIUM 1000 + D) 1000-20 MG-MCG TABS    VITAMIN D 1000 u Take 2,000 Units  daily.    Diltiazem  CD 120 MG 24 hr  Take 1 capsule Daily for BP   LEXAPRO 20 MG tablet Take 1 tablet Daily for Mood   famotidine  20 MG tablet TAKE 1 TABLET TWICE A DAY   ADVAIR 100-50  Inhale 1 puff  2  times daily   MAGNESIUM  magnesium   Multiple Vitamin  Take 1 capsule  daily.   Multiple Vitamins-Minerals (ZINC ) zinc   psyllium (REGULOID) 0.52 g capsule Take 0.52 g  daily.   rosuvastatin (CRESTOR) 10 MG tablet Take 1 tablet Daily for Cholesterol   traMADol 50 MG tablet      Allergies  Allergen Reactions   Other Shortness Of Breath    Household bleach- "takes my breath away"   Azithromycin Hives   Celexa [Citalopram Hydrobromide]     Myalgias   Trintellix [Vortioxetine] Other (See Comments)  Dysphoria    Vicodin [Hydrocodone-Acetaminophen] Nausea And Vomiting   Wellbutrin [Bupropion]     anxiety     PMHx:   Past Medical History:  Diagnosis Date   Allergy    Arthritis    Asthma    Benign hematuria 2004   Depression    Hyperlipidemia    Hypertension    TMJ (temporomandibular joint syndrome)    Vitamin D deficiency      Immunization History  Administered Date(s) Administered   Influenza Inj Mdck Quad 12/13/2017   Influenza,inj,Quad 12/18/2018   Influenza 12/30/2020   Moderna Sars-Covid-2 Vacc 02/20/2020   PFIZER Covid-19 Vacc 05/27/2019, 06/17/2019, 01/15/2022   PFIZER SARS-COV-2 Vacc 10/22/2020   Pneumococcal-23 04/03/2009   Td 04/03/2004, 01/25/2021   Zoster Recombinat (Shingrix) 06/18/2020, 08/20/2020     Past Surgical History:  Procedure Laterality Date   ANKLE SURGERY Left 2017   ORIF   BLEPHAROPLASTY Bilateral 04/09/2021   BREAST SURGERY Left    lumpectomy benign   KNEE ARTHROSCOPY W/ DEBRIDEMENT Right 11/2017   TOTAL ABDOMINAL HYSTERECTOMY  2003   Ovaries in place. Done for bleeding, had benign pathology.   TOTAL KNEE ARTHROPLASTY Right 02/2019   Dr. Berenice Primas    TUBAL LIGATION       FHx:    Reviewed / unchanged   SHx:    Reviewed / unchanged    Systems Review:  Constitutional: Denies fever, chills, wt changes, headaches, insomnia, fatigue, night sweats, change in appetite. Eyes: Denies redness, blurred vision, diplopia, discharge, itchy, watery eyes.  ENT: Denies discharge, congestion, post nasal drip, epistaxis, sore throat, earache, hearing loss, dental pain, tinnitus, vertigo, sinus pain, snoring.  CV: Denies chest pain, palpitations, irregular heartbeat, syncope, dyspnea, diaphoresis, orthopnea, PND, claudication or edema. Respiratory: denies cough, dyspnea, DOE, pleurisy, hoarseness, laryngitis, wheezing.  Gastrointestinal: Denies dysphagia, odynophagia, heartburn, reflux, water brash, abdominal pain or cramps, nausea, vomiting, bloating, diarrhea, constipation, hematemesis, melena, hematochezia  or hemorrhoids. Genitourinary: Denies dysuria, frequency, urgency, nocturia, hesitancy, discharge, hematuria or flank pain. Musculoskeletal: Denies arthralgias, myalgias, stiffness, jt. swelling, pain, limping or strain/sprain.  Skin: Denies pruritus, rash, hives, warts, acne, eczema or change in skin lesion(s). Neuro: No weakness, tremor, incoordination, spasms, paresthesia or pain. Psychiatric: Denies confusion, memory loss or sensory loss. Endo: Denies change in weight, skin or hair change.  Heme/Lymph: No excessive bleeding, bruising or enlarged lymph nodes.   Physical Exam  BP 138/78   Pulse 69   Temp 98 F (36.7 C)   Resp 17   Ht '5\' 5"'$  (1.651 m)   Wt 191 lb 6.4 oz (86.8 kg)   SpO2 97%   BMI 31.85 kg/m   Appears  well nourished, well groomed  and in no distress.  Eyes: PERRLA, EOMs, conjunctiva no swelling or erythema. Sinuses: No frontal/maxillary tenderness ENT/Mouth: EAC's clear, TM's nl w/o erythema, bulging. Nares clear w/o erythema, swelling, exudates. Oropharynx clear without erythema or exudates. Oral hygiene is good.  Tongue normal, non obstructing. Hearing intact.  Neck: Supple. Thyroid not palpable. Car 2+/2+ without bruits, nodes or JVD. Chest: Respirations nl with BS clear & equal w/o rales, rhonchi, wheezing or stridor.  Cor: Heart sounds normal w/ regular rate and rhythm without sig. murmurs, gallops, clicks or rubs. Peripheral pulses normal and equal  without edema.  Abdomen: Soft & bowel sounds normal. Non-tender w/o guarding, rebound, hernias, masses or organomegaly.  Lymphatics: Unremarkable.  Musculoskeletal: Full ROM all peripheral extremities, joint stability, 5/5 strength and normal gait.  Skin: Warm, dry without exposed  rashes, lesions or ecchymosis apparent.  Neuro: Cranial nerves intact, reflexes equal bilaterally. Sensory-motor testing grossly intact. Tendon reflexes grossly intact.  Pysch: Alert & oriented x 3.  Insight and judgement nl & appropriate. No ideations.   Assessment and Plan:  1. Hypertension, essential  - Continue medication, monitor blood pressure at home.  - Continue DASH diet.  Reminder to go to the ER if any CP,  SOB, nausea, dizziness, severe HA, changes vision/speech.    - CBC with Differential/Platelet - COMPLETE METABOLIC PANEL WITH GFR - Magnesium - TSH  2. Hyperlipidemia, mixed  - Continue diet/meds, exercise,& lifestyle modifications.  - Continue monitor periodic cholesterol/liver & renal functions      - Lipid panel - TSH  3. Abnormal glucose  - Continue diet, exercise  - Lifestyle modifications.  - Monitor appropriate labs   - Hemoglobin A1c - Insulin, random  4. Vitamin D deficiency  - Continue supplementation   - VITAMIN D 25 Hydroxy  5. B12 deficiency  - Vitamin B12  6. Medication management  - CBC with Differential/Platelet - COMPLETE METABOLIC PANEL WITH GFR - Magnesium - Lipid panel - TSH - Hemoglobin A1c - Insulin, random - VITAMIN D 25 Hydroxy          Discussed  regular exercise, BP monitoring, weight control to  achieve/maintain BMI less than 25 and discussed med and SE's. Recommended labs to assess /monitor clinical status .  I discussed the assessment and treatment plan with the patient. The patient was provided an opportunity to ask questions and all were answered. The patient agreed with the plan and demonstrated an understanding of the instructions.  I provided over 30 minutes of exam, counseling, chart review and  complex critical decision making.        The patient was advised to call back or seek an in-person evaluation if the symptoms worsen or if the condition fails to improve as anticipated.   Kirtland Bouchard, MD

## 2022-05-05 NOTE — Patient Instructions (Addendum)
Due to recent changes in healthcare laws, you may see the results of your imaging and laboratory studies on MyChart before your provider has had a chance to review them.  We understand that in some cases there may be results that are confusing or concerning to you. Not all laboratory results come back in the same time frame and the provider may be waiting for multiple results in order to interpret others.  Please give [place] 48 hours in order for your provider to thoroughly review all the results before contacting the office for clarification of your results.  ++++++++++++++++++++++++++  Vit D  & Vit C 1,000 mg   are recommended to help protect  against the Covid-19 and other Corona viruses.    Also it's recommended  to take  Zinc 50 mg x 1/2 tab = 25 mg for ladies to help  protect against the Covid-19   and best place to get  is also on [email or web address]  and don't pay more than 6-8 cents /pill !   +++++++++++++++++++++++++++++++++++++++ Recommend Adult Low Dose Aspirin or  coated  Aspirin 81 mg daily  To reduce risk of Colon Cancer 40 %,  Skin Cancer 26 % ,  Melanoma 46%  and  Pancreatic cancer 60% +++++++++++++++++++++++++++++++++++++++++ Vitamin B12 Deficiency  Vitamin B12 deficiency occurs when the body does not have enough of this important vitamin. The body needs this vitamin: To make red blood cells. To make DNA. This is the genetic material inside cells. To help the nerves work properly so they can carry messages from the brain to the body. Vitamin B12 deficiency can cause health problems, such as not having enough red blood cells in the blood (anemia). This can lead to nerve damage if untreated. What are the causes? This condition may be caused by: Not eating enough foods that contain vitamin B12. Not having enough stomach acid and digestive fluids to properly absorb vitamin B12 from the food that you eat. Having certain diseases that make it hard to absorb vitamin B12. These  diseases include Crohn's disease, chronic pancreatitis, and cystic fibrosis. An autoimmune disorder in which the body does not make enough of a protein (intrinsic factor) within the stomach, resulting in not enough absorption of vitamin B12. Having a surgery in which part of the stomach or small intestine is removed. Taking certain medicines that make it hard for the body to absorb vitamin B12. These include: Heartburn medicines, such as antacids and proton pump inhibitors. Some medicines that are used to treat diabetes. What increases the risk? The following factors may make you more likely to develop a vitamin B12 deficiency: Being an older adult. Eating a vegetarian or vegan diet that does not include any foods that come from animals. Eating a poor diet while you are pregnant. Taking certain medicines. Having alcoholism. What are the signs or symptoms? In some cases, there are no symptoms of this condition. If the condition leads to anemia or nerve damage, various symptoms may occur, such as: Weakness. Tiredness (fatigue). Loss of appetite. Numbness or tingling in your hands and feet. Redness and burning of the tongue. Depression, confusion, or memory problems. Trouble walking. If anemia is severe, symptoms can include: Shortness of breath. Dizziness. Rapid heart rate. How is this diagnosed? This condition may be diagnosed with a blood test to measure the level of vitamin B12 in your blood. You may also have other tests, including: A group of tests that measure certain characteristics of blood cells (complete blood count, CBC).  A blood test to measure intrinsic factor. A procedure where a thin tube with a camera on the end is used to look into your stomach or intestines (endoscopy). Other tests may be needed to discover the cause of the deficiency. How is this treated? Treatment for this condition depends on the cause. This condition may be treated by: Changing your eating and  drinking habits, such as: Eating more foods that contain vitamin B12. Drinking less alcohol or no alcohol. Getting vitamin B12 injections. Taking vitamin B12 supplements by mouth (orally). Your health care provider will tell you which dose is best for you. Follow these instructions at home:  Eating and drinking  Include foods in your diet that come from animals and contain a lot of vitamin B12. These include: Meats and poultry. This includes beef, pork, chicken, [place], and organ meats, such as liver. Seafood. This includes clams, rainbow trout, salmon, tuna, and haddock. Eggs. Dairy foods such as milk, yogurt, and cheese. Eat foods that have vitamin B12 added to them (are fortified), such as ready-to-eat breakfast cereals. Check the label on the package to see if a food is fortified. The items listed above may not be a complete list of foods and beverages you can eat and drink. Contact a dietitian for more information. Alcohol use Do not drink alcohol if: Your health care provider tells you not to drink. You are pregnant, may be pregnant, or are planning to become pregnant. If you drink alcohol: Limit how much you have to: 0-1 drink a day for women. 0-2 drinks a day for men. Know how much alcohol is in your drink. In the [place] one drink equals one 12 oz bottle of beer (355 mL), one 5 oz glass of wine (148 mL), or one 1 oz glass of hard liquor (44 mL). General instructions Get vitamin B12 injections if told to by your health care provider. Take supplements only as told by your health care provider. Follow the directions carefully. Keep all follow-up visits. This is important. Contact a health care provider if: Your symptoms come back. Your symptoms get worse or do not improve with treatment. Get help right away: You develop shortness of breath. You have a rapid heart rate. You have chest pain. You become dizzy or you faint. These symptoms may be an emergency. Get help right  away. Call 911. Do not wait to see if the symptoms will go away. Do not drive yourself to the hospital. Summary Vitamin B12 deficiency occurs when the body does not have enough of this important vitamin. Common causes include not eating enough foods that contain vitamin B12, not being able to absorb vitamin B12 from the food that you eat, having a surgery in which part of the stomach or small intestine is removed, or taking certain medicines. Eat foods that have vitamin B12 in them. Treatment may include making a change in the way you eat and drink, getting vitamin B12 injections, or taking vitamin B12 supplements. This information is not intended to replace advice given to you by your health care provider. Make sure you discuss any questions you have with your health care provider. Document Revised: 10/20/2020 Document Reviewed: 10/20/2020 Elsevier Patient Education  [place].  ++++++++++++++++++++++++++++++++++++++++ Vitamin D goal  is between 70-100.  Please make sure that you are taking your Vitamin D as directed.  It is very important as a natural anti-inflammatory  helping hair, skin, and nails, as well as reducing stroke and heart attack risk.  It  helps your bones and helps with mood. It also decreases numerous cancer risks so please take it as directed.  Low Vit D is associated with a 200-300% higher risk for CANCER  and 200-300% higher risk for HEART   ATTACK  &  STROKE.   .....................................Marland Kitchen It is also associated with higher death rate at younger ages,  autoimmune diseases like Rheumatoid arthritis, Lupus, Multiple Sclerosis.    Also many other serious conditions, like depression, Alzheimer's Dementia, infertility, muscle aches, fatigue, fibromyalgia - just to name a few. +++++++++++++++++++++++++++++++++++++++++ Recommend the book "The END of DIETING" by Dr Excell Seltzer  & the book "The END of DIABETES " by Dr Excell Seltzer At [email or web address] - get book  & Audio CD's    Being diabetic has a  300% increased risk for heart attack, stroke, cancer, and alzheimer- type vascular dementia. It is very important that you work harder with diet by avoiding all foods that are white. Avoid white rice (brown & wild rice is OK), white potatoes (sweetpotatoes in moderation is OK), White bread or wheat bread or anything made out of white flour like bagels, donuts, rolls, buns, biscuits, cakes, pastries, cookies, pizza crust, and pasta (made from white flour & egg whites) - vegetarian pasta or spinach or wheat pasta is OK. Multigrain breads like Arnold's or [employer], or multigrain sandwich thins or flatbreads.  Diet, exercise and weight loss can reverse and cure diabetes in the early stages.  Diet, exercise and weight loss is very important in the control and prevention of complications of diabetes which affects every system in your body, ie. Brain - dementia/stroke, eyes - glaucoma/blindness, heart - heart attack/heart failure, kidneys - dialysis, stomach - gastric paralysis, intestines - malabsorption, nerves - severe painful neuritis, circulation - gangrene & loss of a leg(s), and finally cancer and Alzheimers.    I recommend avoid fried & greasy foods,  sweets/candy, white rice (brown or wild rice or Quinoa is OK), white potatoes (sweet potatoes are OK) - anything made from white flour - bagels, doughnuts, rolls, buns, biscuits,white and wheat breads, pizza crust and traditional pasta made of white flour & egg white(vegetarian pasta or spinach or wheat pasta is OK).  Multi-grain bread is OK - like multi-grain flat bread or sandwich thins. Avoid alcohol in excess. Exercise is also important.    Eat all the vegetables you want - avoid meat, especially red meat and dairy - especially cheese.  Cheese is the most concentrated form of trans-fats which is the worst thing to clog up our arteries. Veggie cheese is OK which can be found in the fresh produce section at  [employer] or [employer] or [employer]  +++++++++++++++++++++++++++++++++++++++ DASH Eating Plan  DASH stands for "Dietary Approaches to Stop Hypertension."   The DASH eating plan is a healthy eating plan that has been shown to reduce high blood pressure (hypertension). Additional health benefits may include reducing the risk of type 2 diabetes mellitus, heart disease, and stroke. The DASH eating plan may also help with weight loss. WHAT DO I NEED TO KNOW ABOUT THE DASH EATING PLAN? For the DASH eating plan, you will follow these general guidelines: Choose foods with a percent daily value for sodium of less than 5% (as listed on the food label). Use salt-free seasonings or herbs instead of table salt or sea salt. Check with your health care provider or pharmacist before using salt substitutes. Eat lower-sodium products, often labeled as "lower sodium" or "no salt added." Eat fresh  foods. Eat more vegetables, fruits, and low-fat dairy products. Choose whole grains. Look for the word "whole" as the first word in the ingredient list. Choose fish  Limit sweets, desserts, sugars, and sugary drinks. Choose heart-healthy fats. Eat veggie cheese  Eat more home-cooked food and less restaurant, buffet, and fast food. Limit fried foods. Cook foods using methods other than frying. Limit canned vegetables. If you do use them, rinse them well to decrease the sodium. When eating at a restaurant, ask that your food be prepared with less salt, or no salt if possible.                      WHAT FOODS CAN I EAT? Read Dr Fara Olden Fuhrman's books on The End of Dieting & The End of Diabetes  Grains Whole grain or whole wheat bread. Brown rice. Whole grain or whole wheat pasta. Quinoa, bulgur, and whole grain cereals. Low-sodium cereals. Corn or whole wheat flour tortillas. Whole grain cornbread. Whole grain crackers. Low-sodium crackers.  Vegetables Fresh or frozen vegetables (raw, steamed, roasted, or  grilled). Low-sodium or reduced-sodium tomato and vegetable juices. Low-sodium or reduced-sodium tomato sauce and paste. Low-sodium or reduced-sodium canned vegetables.   Fruits All fresh, canned (in natural juice), or frozen fruits.  Protein Products  All fish and seafood.  Dried beans, peas, or lentils. Unsalted nuts and seeds. Unsalted canned beans.  Dairy Low-fat dairy products, such as skim or 1% milk, 2% or reduced-fat cheeses, low-fat ricotta or cottage cheese, or plain low-fat yogurt. Low-sodium or reduced-sodium cheeses.  Fats and Oils Tub margarines without trans fats. Light or reduced-fat mayonnaise and salad dressings (reduced sodium). Avocado. Safflower, olive, or canola oils. Natural peanut or almond butter.  Other Unsalted popcorn and pretzels. The items listed above may not be a complete list of recommended foods or beverages. Contact your dietitian for more options.  +++++++++++++++  WHAT FOODS ARE NOT RECOMMENDED? Grains/ White flour or wheat flour White bread. White pasta. White rice. Refined cornbread. Bagels and croissants. Crackers that contain trans fat.  Vegetables  Creamed or fried vegetables. Vegetables in a . Regular canned vegetables. Regular canned tomato sauce and paste. Regular tomato and vegetable juices.  Fruits Dried fruits. Canned fruit in light or heavy syrup. Fruit juice.  Meat and Other Protein Products Meat in general - RED meat & White meat.  Fatty cuts of meat. Ribs, chicken wings, all processed meats as bacon, sausage, bologna, salami, fatback, hot dogs, bratwurst and packaged luncheon meats.  Dairy Whole or 2% milk, cream, half-and-half, and cream cheese. Whole-fat or sweetened yogurt. Full-fat cheeses or blue cheese. Non-dairy creamers and whipped toppings. Processed cheese, cheese spreads, or cheese curds.  Condiments Onion and garlic salt, seasoned salt, table salt, and sea salt. Canned and packaged gravies. Worcestershire sauce.  Tartar sauce. Barbecue sauce. Teriyaki sauce. Soy sauce, including reduced sodium. Steak sauce. Fish sauce. Oyster sauce. Cocktail sauce. Horseradish. Ketchup and mustard. Meat flavorings and tenderizers. Bouillon cubes. Hot sauce. Tabasco sauce. Marinades. Taco seasonings. Relishes.  Fats and Oils Butter, stick margarine, lard, shortening and bacon fat. Coconut, palm kernel, or palm oils. Regular salad dressings.  Pickles and olives. Salted popcorn and pretzels.  The items listed above may not be a complete list of foods and beverages to avoid.

## 2022-05-06 ENCOUNTER — Ambulatory Visit (INDEPENDENT_AMBULATORY_CARE_PROVIDER_SITE_OTHER): Payer: Medicare HMO | Admitting: Internal Medicine

## 2022-05-06 ENCOUNTER — Encounter: Payer: Self-pay | Admitting: Internal Medicine

## 2022-05-06 VITALS — BP 138/78 | HR 69 | Temp 98.0°F | Resp 17 | Ht 65.0 in | Wt 191.4 lb

## 2022-05-06 DIAGNOSIS — I10 Essential (primary) hypertension: Secondary | ICD-10-CM

## 2022-05-06 DIAGNOSIS — E782 Mixed hyperlipidemia: Secondary | ICD-10-CM | POA: Diagnosis not present

## 2022-05-06 DIAGNOSIS — Z79899 Other long term (current) drug therapy: Secondary | ICD-10-CM | POA: Diagnosis not present

## 2022-05-06 DIAGNOSIS — R7309 Other abnormal glucose: Secondary | ICD-10-CM

## 2022-05-06 DIAGNOSIS — E559 Vitamin D deficiency, unspecified: Secondary | ICD-10-CM | POA: Diagnosis not present

## 2022-05-06 DIAGNOSIS — E538 Deficiency of other specified B group vitamins: Secondary | ICD-10-CM

## 2022-05-07 LAB — CBC WITH DIFFERENTIAL/PLATELET
Absolute Monocytes: 628 cells/uL (ref 200–950)
Basophils Absolute: 60 cells/uL (ref 0–200)
Basophils Relative: 0.7 %
Eosinophils Absolute: 224 cells/uL (ref 15–500)
Eosinophils Relative: 2.6 %
HCT: 40.9 % (ref 35.0–45.0)
Hemoglobin: 13.7 g/dL (ref 11.7–15.5)
Lymphs Abs: 2141 cells/uL (ref 850–3900)
MCH: 30.6 pg (ref 27.0–33.0)
MCHC: 33.5 g/dL (ref 32.0–36.0)
MCV: 91.3 fL (ref 80.0–100.0)
MPV: 10.9 fL (ref 7.5–12.5)
Monocytes Relative: 7.3 %
Neutro Abs: 5547 cells/uL (ref 1500–7800)
Neutrophils Relative %: 64.5 %
Platelets: 288 10*3/uL (ref 140–400)
RBC: 4.48 10*6/uL (ref 3.80–5.10)
RDW: 12.3 % (ref 11.0–15.0)
Total Lymphocyte: 24.9 %
WBC: 8.6 10*3/uL (ref 3.8–10.8)

## 2022-05-07 LAB — COMPLETE METABOLIC PANEL WITH GFR
AG Ratio: 2 (calc) (ref 1.0–2.5)
ALT: 10 U/L (ref 6–29)
AST: 11 U/L (ref 10–35)
Albumin: 4.3 g/dL (ref 3.6–5.1)
Alkaline phosphatase (APISO): 89 U/L (ref 37–153)
BUN: 13 mg/dL (ref 7–25)
CO2: 26 mmol/L (ref 20–32)
Calcium: 9.6 mg/dL (ref 8.6–10.4)
Chloride: 106 mmol/L (ref 98–110)
Creat: 0.82 mg/dL (ref 0.50–1.05)
Globulin: 2.1 g/dL (calc) (ref 1.9–3.7)
Glucose, Bld: 89 mg/dL (ref 65–99)
Potassium: 4.3 mmol/L (ref 3.5–5.3)
Sodium: 140 mmol/L (ref 135–146)
Total Bilirubin: 0.3 mg/dL (ref 0.2–1.2)
Total Protein: 6.4 g/dL (ref 6.1–8.1)
eGFR: 81 mL/min/{1.73_m2} (ref >=60)

## 2022-05-07 LAB — INSULIN, RANDOM: Insulin: 15.3 u[IU]/mL

## 2022-05-07 LAB — LIPID PANEL
Cholesterol: 153 mg/dL (ref <200)
HDL: 63 mg/dL (ref >=50)
LDL Cholesterol (Calc): 63 mg/dL (calc)
Non-HDL Cholesterol (Calc): 90 mg/dL (calc) (ref <130)
Total CHOL/HDL Ratio: 2.4 (calc) (ref <5.0)
Triglycerides: 200 mg/dL — ABNORMAL HIGH (ref <150)

## 2022-05-07 LAB — HEMOGLOBIN A1C
Hgb A1c MFr Bld: 5.5 % of total Hgb (ref <5.7)
Mean Plasma Glucose: 111 mg/dL
eAG (mmol/L): 6.2 mmol/L

## 2022-05-07 LAB — VITAMIN B12: Vitamin B-12: 426 pg/mL (ref 200–1100)

## 2022-05-07 LAB — MAGNESIUM: Magnesium: 2 mg/dL (ref 1.5–2.5)

## 2022-05-07 LAB — VITAMIN D 25 HYDROXY (VIT D DEFICIENCY, FRACTURES): Vit D, 25-Hydroxy: 42 ng/mL (ref 30–100)

## 2022-05-07 LAB — TSH: TSH: 1.59 mIU/L (ref 0.40–4.50)

## 2022-05-07 NOTE — Progress Notes (Signed)
<><><><><><><><><><><><><><><><><><><><><><><><><><><><><><><><><> <><><><><><><><><><><><><><><><><><><><><><><><><><><><><><><><><> - Test results slightly outside the reference range are not unusual. If there is anything important, I will review this with you,  otherwise it is considered normal test values.  If you have further questions,  please do not hesitate to contact me at the office or via My Chart.  <><><><><><><><><><><><><><><><><><><><><><><><><><><><><><><><><> <><><><><><><><><><><><><><><><><><><><><><><><><><><><><><><><><>  -  CFhol = 153 & LDL = 63 - Both  Excellent   - Very low risk for Heart Attack  / Stroke <><><><><><><><><><><><><><><><><><><><><><><><><><><><><><><><><>   - But Triglycerides                                                                                                                                                                                                                                                               = 200 ) or fats in blood are too high                 (   Ideal or  Goal is less than 150  !  )    - Recommend avoid fried & greasy foods,  sweets / candy,   - Avoid white rice  (brown or wild rice or Quinoa is OK),   - Avoid white potatoes  (sweet potatoes are OK)   - Avoid anything made from white flour  - bagels, doughnuts, rolls, buns, biscuits, white and   wheat breads, pizza crust and traditional  pasta made of white flour & egg white  - (vegetarian pasta or spinach or wheat pasta is OK).    - Multi-grain bread is OK - like multi-grain flat bread or  sandwich thins.   - Avoid alcohol in excess.   - Exercise is also important. <><><><><><><><><><><><><><><><><><><><><><><><><><><><><><><><><>  -  A1c - Normal - No Diabetes  - Great  ! <><><><><><><><><><><><><><><><><><><><><><><><><><><><><><><><><>  -  Vitamin D = 42 - is Low  -   - Vitamin D goal is between 70-100.   - Please INCREASE your Vitamin  D to 5,000 units /day   - It is very important as a natural anti-inflammatory and helping the  immune system protect against viral infections, like the Covid-19    helping hair, skin, and nails, as well as reducing stroke and  heart attack risk.   - It helps your bones and  helps with mood.  - It also decreases numerous cancer risks so please  take it as directed.   - Low Vit D is associated with a 200-300% higher risk for  CANCER   and 200-300% higher risk for HEART   ATTACK  &  STROKE.    - It is also associated with higher death rate at younger ages,   autoimmune diseases like Rheumatoid arthritis, Lupus,  Multiple Sclerosis.     - Also many other serious conditions, like depression, Alzheimer's  Dementia, infertility, muscle aches, fatigue, fibromyalgia  <><><><><><><><><><><><><><><><><><><><><><><><><><><><><><><><><> <><><><><><><><><><><><><><><><><><><><><><><><><><><><><><><><><>  -    -  Vitamin B12 = 426  Very Low  (Ideal or Goal Vit B12 is between 450 - 1,100)   Low Vit B12 may be associated with Anemia , Fatigue,   Peripheral Neuropathy, Dementia, "Brain Fog", & Depression  - Recommend take a sub-lingual form of Vitamin B12 tablet   1,000 to 5,000 mcg tab that you dissolve under your tongue /Daily   - Can get Baron Sane - best price at [employer] or on [employer] <><><><><><><><><><><><><><><><><><><><><><><><><><><><><><><><><>  -  All Else - CBC - Kidneys - Electrolytes - Liver - Magnesium & Thyroid    - all  Normal / OK <><><><><><><><><><><><><><><><><><><><><><><><><><><><><><><><><>  -

## 2022-05-08 DIAGNOSIS — L7211 Pilar cyst: Secondary | ICD-10-CM | POA: Diagnosis not present

## 2022-05-08 DIAGNOSIS — L821 Other seborrheic keratosis: Secondary | ICD-10-CM | POA: Diagnosis not present

## 2022-05-16 DIAGNOSIS — L7211 Pilar cyst: Secondary | ICD-10-CM | POA: Diagnosis not present

## 2022-06-06 DIAGNOSIS — J302 Other seasonal allergic rhinitis: Secondary | ICD-10-CM | POA: Diagnosis not present

## 2022-06-06 DIAGNOSIS — E785 Hyperlipidemia, unspecified: Secondary | ICD-10-CM | POA: Diagnosis not present

## 2022-06-06 DIAGNOSIS — E669 Obesity, unspecified: Secondary | ICD-10-CM | POA: Diagnosis not present

## 2022-06-06 DIAGNOSIS — J449 Chronic obstructive pulmonary disease, unspecified: Secondary | ICD-10-CM | POA: Diagnosis not present

## 2022-06-06 DIAGNOSIS — Z008 Encounter for other general examination: Secondary | ICD-10-CM | POA: Diagnosis not present

## 2022-06-06 DIAGNOSIS — I10 Essential (primary) hypertension: Secondary | ICD-10-CM | POA: Diagnosis not present

## 2022-06-06 DIAGNOSIS — K2270 Barrett's esophagus without dysplasia: Secondary | ICD-10-CM | POA: Diagnosis not present

## 2022-06-06 DIAGNOSIS — F325 Major depressive disorder, single episode, in full remission: Secondary | ICD-10-CM | POA: Diagnosis not present

## 2022-06-06 DIAGNOSIS — Z823 Family history of stroke: Secondary | ICD-10-CM | POA: Diagnosis not present

## 2022-06-06 DIAGNOSIS — M1990 Unspecified osteoarthritis, unspecified site: Secondary | ICD-10-CM | POA: Diagnosis not present

## 2022-06-06 DIAGNOSIS — Z809 Family history of malignant neoplasm, unspecified: Secondary | ICD-10-CM | POA: Diagnosis not present

## 2022-06-06 DIAGNOSIS — F411 Generalized anxiety disorder: Secondary | ICD-10-CM | POA: Diagnosis not present

## 2022-06-06 DIAGNOSIS — K219 Gastro-esophageal reflux disease without esophagitis: Secondary | ICD-10-CM | POA: Diagnosis not present

## 2022-06-10 LAB — HM MAMMOGRAPHY

## 2022-06-18 DIAGNOSIS — L7211 Pilar cyst: Secondary | ICD-10-CM | POA: Diagnosis not present

## 2022-06-18 DIAGNOSIS — B353 Tinea pedis: Secondary | ICD-10-CM | POA: Diagnosis not present

## 2022-06-27 DIAGNOSIS — L7211 Pilar cyst: Secondary | ICD-10-CM | POA: Diagnosis not present

## 2022-07-01 DIAGNOSIS — K635 Polyp of colon: Secondary | ICD-10-CM | POA: Diagnosis not present

## 2022-07-01 DIAGNOSIS — Z800 Family history of malignant neoplasm of digestive organs: Secondary | ICD-10-CM | POA: Diagnosis not present

## 2022-07-01 DIAGNOSIS — D122 Benign neoplasm of ascending colon: Secondary | ICD-10-CM | POA: Diagnosis not present

## 2022-07-01 DIAGNOSIS — K5730 Diverticulosis of large intestine without perforation or abscess without bleeding: Secondary | ICD-10-CM | POA: Diagnosis not present

## 2022-07-01 DIAGNOSIS — Z86010 Personal history of colonic polyps: Secondary | ICD-10-CM | POA: Diagnosis not present

## 2022-07-01 DIAGNOSIS — K6389 Other specified diseases of intestine: Secondary | ICD-10-CM | POA: Diagnosis not present

## 2022-07-01 DIAGNOSIS — K648 Other hemorrhoids: Secondary | ICD-10-CM | POA: Diagnosis not present

## 2022-07-01 DIAGNOSIS — D124 Benign neoplasm of descending colon: Secondary | ICD-10-CM | POA: Diagnosis not present

## 2022-07-26 ENCOUNTER — Ambulatory Visit: Payer: Medicare Other | Admitting: Nurse Practitioner

## 2022-08-14 ENCOUNTER — Encounter: Payer: Self-pay | Admitting: Internal Medicine

## 2022-08-14 ENCOUNTER — Encounter: Payer: Self-pay | Admitting: Nurse Practitioner

## 2022-08-14 ENCOUNTER — Ambulatory Visit (INDEPENDENT_AMBULATORY_CARE_PROVIDER_SITE_OTHER): Payer: Medicare Other | Admitting: Nurse Practitioner

## 2022-08-14 VITALS — BP 128/80 | HR 61 | Temp 97.9°F | Ht 65.0 in | Wt 191.4 lb

## 2022-08-14 DIAGNOSIS — J449 Chronic obstructive pulmonary disease, unspecified: Secondary | ICD-10-CM

## 2022-08-14 DIAGNOSIS — Z79899 Other long term (current) drug therapy: Secondary | ICD-10-CM

## 2022-08-14 DIAGNOSIS — I10 Essential (primary) hypertension: Secondary | ICD-10-CM | POA: Diagnosis not present

## 2022-08-14 DIAGNOSIS — R6889 Other general symptoms and signs: Secondary | ICD-10-CM

## 2022-08-14 DIAGNOSIS — Z1152 Encounter for screening for COVID-19: Secondary | ICD-10-CM

## 2022-08-14 DIAGNOSIS — R051 Acute cough: Secondary | ICD-10-CM

## 2022-08-14 DIAGNOSIS — R7309 Other abnormal glucose: Secondary | ICD-10-CM

## 2022-08-14 DIAGNOSIS — Z0000 Encounter for general adult medical examination without abnormal findings: Secondary | ICD-10-CM

## 2022-08-14 DIAGNOSIS — F3341 Major depressive disorder, recurrent, in partial remission: Secondary | ICD-10-CM

## 2022-08-14 DIAGNOSIS — K760 Fatty (change of) liver, not elsewhere classified: Secondary | ICD-10-CM

## 2022-08-14 DIAGNOSIS — F17200 Nicotine dependence, unspecified, uncomplicated: Secondary | ICD-10-CM

## 2022-08-14 DIAGNOSIS — F411 Generalized anxiety disorder: Secondary | ICD-10-CM | POA: Diagnosis not present

## 2022-08-14 DIAGNOSIS — Z0001 Encounter for general adult medical examination with abnormal findings: Secondary | ICD-10-CM | POA: Diagnosis not present

## 2022-08-14 DIAGNOSIS — R0981 Nasal congestion: Secondary | ICD-10-CM

## 2022-08-14 DIAGNOSIS — E538 Deficiency of other specified B group vitamins: Secondary | ICD-10-CM

## 2022-08-14 DIAGNOSIS — E782 Mixed hyperlipidemia: Secondary | ICD-10-CM

## 2022-08-14 DIAGNOSIS — E669 Obesity, unspecified: Secondary | ICD-10-CM

## 2022-08-14 DIAGNOSIS — E559 Vitamin D deficiency, unspecified: Secondary | ICD-10-CM

## 2022-08-14 LAB — CBC WITH DIFFERENTIAL/PLATELET
Absolute Monocytes: 578 cells/uL (ref 200–950)
Basophils Absolute: 39 cells/uL (ref 0–200)
Basophils Relative: 0.5 %
Eosinophils Relative: 2.1 %
HCT: 42.9 % (ref 35.0–45.0)
Hemoglobin: 14.2 g/dL (ref 11.7–15.5)
MCH: 30.4 pg (ref 27.0–33.0)
MCV: 91.9 fL (ref 80.0–100.0)
Monocytes Relative: 7.5 %
RBC: 4.67 10*6/uL (ref 3.80–5.10)
RDW: 12.6 % (ref 11.0–15.0)

## 2022-08-14 LAB — POC COVID19 BINAXNOW: SARS Coronavirus 2 Ag: NEGATIVE

## 2022-08-14 NOTE — Patient Instructions (Signed)

## 2022-08-14 NOTE — Progress Notes (Signed)
MEDICARE ANNUAL WELLNESS VISIT AND FOLLOW UP Assessment:    Welcome to Medicare Due annually  Health Maintenance reviewed Healthy lifestyle reviewed and goals set   Essential hypertension Discussed DASH (Dietary Approaches to Stop Hypertension) DASH diet is lower in sodium than a typical American diet. Cut back on foods that are high in saturated fat, cholesterol, and trans fats. Eat more whole-grain foods, fish, poultry, and nuts Remain active and exercise as tolerated daily.  Monitor BP at home-Call if greater than 130/80.  Check CMP/CBC     Hyperlipidemia, unspecified hyperlipidemia type Discussed lifestyle modifications. Recommended diet heavy in fruits and veggies, omega 3's. Decrease consumption of animal meats, cheeses, and dairy products. Remain active and exercise as tolerated. Continue to monitor. Check lipids/TSH     Recurrent major depressive disorder, in partial remission (HCC) Continue medications: Escitalopram 20mg  daily Discussed stress management techniques  Discussed, increase water,intake & good sleep hygiene  Discussed increasing exercise & vegetables in diet   Anxiety state Managing well at this time Stress management techniques discussed, increase water, good sleep hygiene discussed, increase exercise, and increase veggies.    Chronic obstructive pulmonary disease, unspecified COPD type (HCC) Mild intermittent asthma without complication No triggers, well controlled symptoms, cont to monitor Continue Advair BID, rinse mouth after use Smoking cessation instruction/counseling given:  counseled patient on the dangers of tobacco use, advised patient to stop smoking, and reviewed strategies to maximize success     Smoker Discussed smoking cessation, benefits Discussed health risks of continued behavior Not ready to quit at this time Will continue to assess readiness  UTD annual screening, last 09/2020 - overdue    B12 deficiency Continue Vitamin  B12   Vitamin D deficiency Continue supplementation to maintain goal of 70-100 Check and monitor levels   Abnormal glucose Education: Reviewed 'ABCs' of diabetes management  Discussed goals to be met and/or maintained include A1C (<7) Blood pressure (<130/80) Cholesterol (LDL <70) Continue Eye Exam yearly  Continue Dental Exam Q6 mo Discussed dietary recommendations Discussed Physical Activity recommendations Check A1C     Hepatic steatosis Per CT 02/12/20 - Resolved Avoid tylenol, alcohol, monitor weight. Continue to monitor   Obesity (BMI 35.0-39.9) with comorbidity (HCC) Discussed appropriate BMI Diet modification. Physical activity. Encouraged/praised to build confidence.  Medication management All medications discussed and reviewed in full. All questions and concerns regarding medications addressed.    Acute cough/nasal congestion Covid Negative Allergy related - suggest antihistamine Avoid triggers  Orders Placed This Encounter  Procedures   CT CHEST LUNG CA SCREEN LOW DOSE W/O CM    Standing Status:   Future    Standing Expiration Date:   08/14/2023    Order Specific Question:   Preferred Imaging Location?    Answer:   [id number]   CBC with Differential/Platelet   COMPLETE METABOLIC PANEL WITH GFR   Lipid panel   POC COVID-19    Order Specific Question:   Previously tested for COVID-19    Answer:   No    Order Specific Question:   Resident in a congregate (group) care setting    Answer:   No    Order Specific Question:   Employed in healthcare setting    Answer:   No    Order Specific Question:   Pregnant    Answer:   No    Notify office for further evaluation and treatment, questions or concerns if any reported s/s fail to improve.   The patient was advised to call  back or seek an in-person evaluation if any symptoms worsen or if the condition fails to improve as anticipated.   Further disposition pending results of labs. Discussed med's  effects and SE's.    I discussed the assessment and treatment plan with the patient. The patient was provided an opportunity to ask questions and all were answered. The patient agreed with the plan and demonstrated an understanding of the instructions.  Discussed med's effects and SE's. Screening labs and tests as requested with regular follow-up as recommended.  I provided 35 minutes of face-to-face time during this encounter including counseling, chart review, and critical decision making was preformed.  Today's Plan of Care is based on a patient-centered health care approach known as shared decision making - the decisions, tests and treatments allow for patient preferences and values to be balanced with clinical evidence.      Future Appointments  Date Time Provider Department Center  01/30/2023  2:00 PM Adela Glimpse, NP [facility] None  08/14/2023 10:30 AM Adela Glimpse, NP [facility] None     Plan:   During the course of the visit the patient was educated and counseled about appropriate screening and preventive services including:   Pneumococcal vaccine  Influenza vaccine Prevnar 13 Td vaccine Screening electrocardiogram Colorectal cancer screening Diabetes screening Glaucoma screening Nutrition counseling    Subjective:  Nicole Phelps is a 62 y.o. female who presents for Medicare Annual Wellness Visit and 3 month follow up. He has Hypertension, essential; Hyperlipidemia, mixed; Depression; Vitamin D deficiency; Obesity (BMI 30.0-34.9); Smoker; Asthma; Benign hematuria; B12 deficiency; Chronic obstructive pulmonary disease (HCC); Mild CAD; Anxiety state; Aortic atherosclerosis (HCC) - CT 09/2020; Osteopenia of necks of both femurs; Hepatic steatosis; Abnormal Pap smear of vagina; and Abnormal glucose on their problem list.  She is married, 2 children, 9 grandgrands. She is retired Engineer, manufacturing.    She is a smoker (22.5 pack year history), Has COPD/asthma and uses Advair  and albuterol. She had normal CXR 04/2020. Insurance changed and won't cover advair, will check for preferred agent. She had low dose lung cancer screening 09/13/2020 that was benign, recommended annual follow up. She is overdue.    BMI is Body mass index is 31.85 kg/m., she has been working on diet and exercise. Wt Readings from Last 3 Encounters:  08/14/22 191 lb 6.4 oz (86.8 kg)  05/06/22 191 lb 6.4 oz (86.8 kg)  01/29/22 190 lb 12.8 oz (86.5 kg)   She had Ct coronary 02/2018 that showed mild disease, medically treated. Aortic atherosclerosis per CT 09/2020.   Her blood pressure has been controlled at home, today their BP is BP: 128/80 She does not workout. She denies chest pain, shortness of breath, dizziness.  She is on cholesterol medication Rosuvastatin and denies myalgias. Her cholesterol is at goal. The cholesterol last visit was:   Lab Results  Component Value Date   CHOL 153 05/06/2022   HDL 63 05/06/2022   LDLCALC 63 05/06/2022   TRIG 200 (H) 05/06/2022   CHOLHDL 2.4 05/06/2022   She has been working on diet and exercise for prediabetes, and denies polydipsia and polyuria. Last A1C in the office was:  Lab Results  Component Value Date   HGBA1C 5.5 05/06/2022   Last GFR Lab Results  Component Value Date   EGFR 81 05/06/2022   Patient is on Vitamin D supplement.   Lab Results  Component Value Date   VD25OH 42 05/06/2022      Medication Review:  Current Outpatient Medications (Cardiovascular):    diltiazem (CARDIZEM CD) 120 MG 24 hr capsule, Take 1 capsule Daily for BP   rosuvastatin (CRESTOR) 10 MG tablet, Take 1 tablet Daily for Cholesterol  Current Outpatient Medications (Respiratory):    albuterol (VENTOLIN HFA) 108 (90 Base) MCG/ACT inhaler, USE 2 PUFFS EVERY 6 HOURS  AS NEEDED FOR SHORTNESS OF  BREATH   azelastine (ASTELIN) 0.1 % nasal spray, Place 2 sprays into both nostrils 2 (two) times daily. Use in each nostril as directed   fluticasone-salmeterol  (ADVAIR) 100-50 MCG/ACT AEPB, Inhale 1 puff into the lungs 2 (two) times daily. Rinse mouth and gargle after each use to avoid thrush.  Current Outpatient Medications (Analgesics):    Acetaminophen (TYLENOL ARTHRITIS PAIN PO), Take by mouth.   traMADol (ULTRAM) 50 MG tablet,    Current Outpatient Medications (Other):    busPIRone (BUSPAR) 15 MG tablet, Take on tablet three times a day for chronic anxiety.   cholecalciferol (VITAMIN D) 1000 UNITS tablet, Take 2,000 Units by mouth daily.    escitalopram (LEXAPRO) 20 MG tablet, Take 1 tablet Daily for Mood   famotidine (PEPCID) 20 MG tablet, TAKE 1 TABLET BY MOUTH TWICE A DAY   MAGNESIUM PO, magnesium   Multiple Vitamin (MULTIVITAMIN) capsule, Take 1 capsule by mouth daily.   Multiple Vitamins-Minerals (ZINC PO), zinc   Calcium Carb-Cholecalciferol (CALCIUM 1000 + D) 1000-20 MG-MCG TABS,    psyllium (REGULOID) 0.52 g capsule, Take 0.52 g by mouth daily. (Patient not taking: Reported on 08/14/2022)  Allergies: Allergies  Allergen Reactions   Other Shortness Of Breath    Household bleach- "takes my breath away"   Azithromycin Hives   Celexa [Citalopram Hydrobromide]     Myalgias   Trintellix [Vortioxetine] Other (See Comments)    Dysphoria    Vicodin [Hydrocodone-Acetaminophen] Nausea And Vomiting   Wellbutrin [Bupropion]     anxiety    Current Problems (verified) has Hypertension, essential; Hyperlipidemia, mixed; Depression; Vitamin D deficiency; Obesity (BMI 30.0-34.9); Smoker; Asthma; Benign hematuria; B12 deficiency; Chronic obstructive pulmonary disease (HCC); Mild CAD; Anxiety state; Aortic atherosclerosis (HCC) - CT 09/2020; Osteopenia of necks of both femurs; Hepatic steatosis; Abnormal Pap smear of vagina; and Abnormal glucose on their problem list.  Screening Tests Immunization History  Administered Date(s) Administered   Influenza Inj Mdck Quad Pf 12/13/2017   Influenza,inj,Quad PF,6+ Mos 12/18/2018    Influenza-Unspecified 12/30/2020   Moderna Sars-Covid-2 Vaccination 02/20/2020   PFIZER Comirnaty(Gray Top)Covid-19 Tri-Sucrose Vaccine 05/27/2019, 06/17/2019, 01/15/2022   PFIZER(Purple Top)SARS-COV-2 Vaccination 10/22/2020   Pneumococcal-Unspecified 04/03/2009   Td 04/03/2004, 01/25/2021   Zoster Recombinat (Shingrix) 06/18/2020, 08/20/2020   Health Maintenance  Topic Date Due   Lung Cancer Screening  09/13/2021   COVID-19 Vaccine (6 - 2023-24 season) 03/12/2022   INFLUENZA VACCINE  10/10/2022   MAMMOGRAM  06/05/2023   Medicare Annual Wellness (AWV)  08/14/2023   PAP SMEAR-Modifier  01/26/2024   DTaP/Tdap/Td (3 - Tdap) 01/26/2031   Colonoscopy  06/30/2032   Hepatitis C Screening  Completed   HIV Screening  Completed   Zoster Vaccines- Shingrix  Completed   HPV VACCINES  Aged Out    Last colonoscopy: 06/2022 Mammogram:  06/2022 Pap:  Done DEXA:  Due age 43  Names of Other Physician/Practitioners you currently use: 1. [facility] here for primary care 2. Eye doctor, last visit 2022 - plans to follow up 3. [facility], dentist, 06/2022 4. Plans to schedule , derm  Patient Care Team:  Lucky Cowboy, MD as PCP - General (Internal Medicine) Sharrell Ku, MD as Consulting Physician (Gastroenterology) Earle Gell, RN as Technician (Urology) Charlett Nose, [facility] (Inactive) as Pharmacist (Pharmacist)  Surgical: She  has a past surgical history that includes Tubal ligation; Breast surgery (Left); Knee arthroscopy w/ debridement (Right, 11/2017); Ankle surgery (Left, 2017); Total knee arthroplasty (Right, 02/2019); Total abdominal hysterectomy (2003); and Blepharoplasty (Bilateral, 04/09/2021). Family Her family history includes Arthritis in her mother; Breast cancer (age of onset: 22) in her sister; Colon cancer in her maternal aunt, maternal aunt, and maternal grandmother; Colon cancer (age of onset: 43) in her mother; Heart attack in  her paternal grandfather; Hypertension in her father; Stroke in her father. Social history  She reports that she has been smoking cigarettes. She started smoking about 47 years ago. She has a 22.50 pack-year smoking history. She has never used smokeless tobacco. She reports current alcohol use. She reports current drug use. Drug: Marijuana.  MEDICARE WELLNESS OBJECTIVES: Physical activity:   Cardiac risk factors:   Depression/mood screen:      08/14/2022   11:06 AM  Depression screen PHQ 2/9  Decreased Interest 0  Down, Depressed, Hopeless 0  PHQ - 2 Score 0    ADLs:     08/14/2022   11:06 AM 05/06/2022    9:10 PM  In your present state of health, do you have any difficulty performing the following activities:  Hearing? 0 0  Comment Bilateral hearin aides - has hearing test sch for end of 08/2022   Vision? 0 0  Difficulty concentrating or making decisions? 0 0  Walking or climbing stairs? 0 0  Dressing or bathing? 0 0  Doing errands, shopping? 0 0     Cognitive Testing  Alert? Yes  Normal Appearance?Yes  Oriented to person? Yes  Place? Yes   Time? Yes  Recall of three objects?  Yes  Can perform simple calculations? Yes  Displays appropriate judgment?Yes  Can read the correct time from a watch face?Yes  EOL planning:     Objective:   Today's Vitals   08/14/22 1031  BP: 128/80  Pulse: 61  Temp: 97.9 F (36.6 C)  SpO2: 99%  Weight: 191 lb 6.4 oz (86.8 kg)  Height: 5\' 5"  (1.651 m)   Body mass index is 31.85 kg/m.  General appearance: alert, no distress, WD/WN, female HEENT: normocephalic, sclerae anicteric, TMs pearly, nares patent, no discharge or erythema, pharynx normal Oral cavity: MMM, no lesions Neck: supple, no lymphadenopathy, no thyromegaly, no masses Heart: RRR, normal S1, S2, no murmurs Lungs: CTA bilaterally, no wheezes, rhonchi, or rales Abdomen: +bs, soft, non tender, non distended, no masses, no hepatomegaly, no splenomegaly Musculoskeletal:  nontender, no swelling, no obvious deformity Extremities: no edema, no cyanosis, no clubbing Pulses: 2+ symmetric, upper and lower extremities, normal cap refill Neurological: alert, oriented x 3, CN2-12 intact, strength normal upper extremities and lower extremities, sensation normal throughout, DTRs 2+ throughout, no cerebellar signs, gait normal Psychiatric: normal affect, behavior normal, pleasant    Medicare Attestation I have personally reviewed: The patient's medical and social history Their use of alcohol, tobacco or illicit drugs Their current medications and supplements The patient's functional ability including ADLs,fall risks, home safety risks, cognitive, and hearing and visual impairment Diet and physical activities Evidence for depression or mood disorders  The patient's weight, height, BMI, and visual acuity have been recorded in the chart.  I have made referrals, counseling, and provided education to the patient based  on review of the above and I have provided the patient with a written personalized care plan for preventive services.     Adela Glimpse, NP   08/14/2022

## 2022-08-15 LAB — COMPLETE METABOLIC PANEL WITH GFR
AG Ratio: 2.3 (calc) (ref 1.0–2.5)
ALT: 15 U/L (ref 6–29)
AST: 13 U/L (ref 10–35)
Albumin: 4.4 g/dL (ref 3.6–5.1)
Alkaline phosphatase (APISO): 94 U/L (ref 37–153)
BUN: 15 mg/dL (ref 7–25)
CO2: 27 mmol/L (ref 20–32)
Calcium: 9.3 mg/dL (ref 8.6–10.4)
Chloride: 105 mmol/L (ref 98–110)
Creat: 0.94 mg/dL (ref 0.50–1.05)
Globulin: 1.9 g/dL (calc) (ref 1.9–3.7)
Glucose, Bld: 87 mg/dL (ref 65–99)
Potassium: 4.6 mmol/L (ref 3.5–5.3)
Sodium: 140 mmol/L (ref 135–146)
Total Bilirubin: 0.7 mg/dL (ref 0.2–1.2)
Total Protein: 6.3 g/dL (ref 6.1–8.1)
eGFR: 69 mL/min/{1.73_m2} (ref >=60)

## 2022-08-15 LAB — CBC WITH DIFFERENTIAL/PLATELET
Eosinophils Absolute: 162 cells/uL (ref 15–500)
Lymphs Abs: 2087 cells/uL (ref 850–3900)
MCHC: 33.1 g/dL (ref 32.0–36.0)
MPV: 10.7 fL (ref 7.5–12.5)
Neutro Abs: 4836 cells/uL (ref 1500–7800)
Neutrophils Relative %: 62.8 %
Platelets: 285 10*3/uL (ref 140–400)
Total Lymphocyte: 27.1 %
WBC: 7.7 10*3/uL (ref 3.8–10.8)

## 2022-08-15 LAB — LIPID PANEL
Cholesterol: 137 mg/dL (ref <200)
HDL: 63 mg/dL (ref >=50)
LDL Cholesterol (Calc): 55 mg/dL (calc)
Non-HDL Cholesterol (Calc): 74 mg/dL (calc) (ref <130)
Total CHOL/HDL Ratio: 2.2 (calc) (ref <5.0)
Triglycerides: 104 mg/dL (ref <150)

## 2022-08-22 ENCOUNTER — Telehealth: Payer: Self-pay | Admitting: Nurse Practitioner

## 2022-08-22 ENCOUNTER — Other Ambulatory Visit: Payer: Self-pay | Admitting: Nurse Practitioner

## 2022-08-22 MED ORDER — DOXYCYCLINE HYCLATE 100 MG PO TABS: 100.0000 mg | ORAL_TABLET | Freq: Two times a day (BID) | ORAL | 0 refills | Status: AC

## 2022-08-22 NOTE — Telephone Encounter (Signed)
Pt. Wanted to let provider know she is still struggling with ongoing sinus infection. Was curious if you could send her in something to pharm on file.

## 2022-09-17 ENCOUNTER — Ambulatory Visit
Admission: RE | Admit: 2022-09-17 | Discharge: 2022-09-17 | Disposition: A | Discharge status: Home / Self Care | Payer: Medicare Other | Source: Ambulatory Visit | Attending: Nurse Practitioner | Admitting: Nurse Practitioner

## 2022-09-17 DIAGNOSIS — F17200 Nicotine dependence, unspecified, uncomplicated: Secondary | ICD-10-CM

## 2022-09-17 DIAGNOSIS — J449 Chronic obstructive pulmonary disease, unspecified: Secondary | ICD-10-CM

## 2022-09-26 DIAGNOSIS — H5203 Hypermetropia, bilateral: Secondary | ICD-10-CM | POA: Diagnosis not present

## 2022-09-26 DIAGNOSIS — H2513 Age-related nuclear cataract, bilateral: Secondary | ICD-10-CM | POA: Diagnosis not present

## 2022-09-26 DIAGNOSIS — H02839 Dermatochalasis of unspecified eye, unspecified eyelid: Secondary | ICD-10-CM | POA: Diagnosis not present

## 2022-09-26 DIAGNOSIS — H52223 Regular astigmatism, bilateral: Secondary | ICD-10-CM | POA: Diagnosis not present

## 2022-10-09 DIAGNOSIS — K5790 Diverticulosis of intestine, part unspecified, without perforation or abscess without bleeding: Secondary | ICD-10-CM | POA: Diagnosis not present

## 2022-10-09 DIAGNOSIS — R066 Hiccough: Secondary | ICD-10-CM | POA: Diagnosis not present

## 2022-10-09 DIAGNOSIS — Z86010 Personal history of colonic polyps: Secondary | ICD-10-CM | POA: Diagnosis not present

## 2022-10-09 DIAGNOSIS — K2270 Barrett's esophagus without dysplasia: Secondary | ICD-10-CM | POA: Diagnosis not present

## 2022-10-09 DIAGNOSIS — K760 Fatty (change of) liver, not elsewhere classified: Secondary | ICD-10-CM | POA: Diagnosis not present

## 2022-10-09 DIAGNOSIS — Z800 Family history of malignant neoplasm of digestive organs: Secondary | ICD-10-CM | POA: Diagnosis not present

## 2022-10-09 DIAGNOSIS — R1310 Dysphagia, unspecified: Secondary | ICD-10-CM | POA: Diagnosis not present

## 2022-10-09 DIAGNOSIS — K449 Diaphragmatic hernia without obstruction or gangrene: Secondary | ICD-10-CM | POA: Diagnosis not present

## 2022-10-09 DIAGNOSIS — K222 Esophageal obstruction: Secondary | ICD-10-CM | POA: Diagnosis not present

## 2022-11-18 ENCOUNTER — Telehealth: Payer: Self-pay | Admitting: Nurse Practitioner

## 2022-11-18 MED ORDER — FLUTICASONE-SALMETEROL 100-50 MCG/ACT IN AEPB: 1.0000 | INHALATION_SPRAY | Freq: Two times a day (BID) | RESPIRATORY_TRACT | 3 refills | Status: DC

## 2022-11-18 NOTE — Addendum Note (Signed)
Addended by: Dionicio Stall on: 11/18/2022 03:50 PM   Modules accepted: Orders

## 2022-11-18 NOTE — Telephone Encounter (Signed)
Pt is requesting refill on albuterol. Pls [facility] to [facility] [place].

## 2022-11-20 ENCOUNTER — Ambulatory Visit (INDEPENDENT_AMBULATORY_CARE_PROVIDER_SITE_OTHER): Payer: Medicare Other | Admitting: Nurse Practitioner

## 2022-11-20 ENCOUNTER — Encounter: Payer: Self-pay | Admitting: Nurse Practitioner

## 2022-11-20 VITALS — HR 89

## 2022-11-20 DIAGNOSIS — U071 COVID-19: Secondary | ICD-10-CM | POA: Diagnosis not present

## 2022-11-20 MED ORDER — BENZONATATE 200 MG PO CAPS
ORAL_CAPSULE | ORAL | 1 refills | Status: AC
Start: 2022-11-20 — End: ?

## 2022-11-20 MED ORDER — ALBUTEROL SULFATE HFA 108 (90 BASE) MCG/ACT IN AERS: INHALATION_SPRAY | RESPIRATORY_TRACT | 1 refills | Status: AC

## 2022-11-20 MED ORDER — PREDNISONE 10 MG PO TABS
ORAL_TABLET | ORAL | 0 refills | Status: DC
Start: 2022-11-20 — End: 2023-02-26

## 2022-11-20 NOTE — Patient Instructions (Signed)

## 2022-11-20 NOTE — Progress Notes (Signed)
THIS ENCOUNTER IS A VIRTUAL VISIT DUE TO COVID-19 - PATIENT WAS NOT SEEN IN THE OFFICE.  PATIENT HAS CONSENTED TO VIRTUAL VISIT / TELEMEDICINE VISIT   Virtual Visit via telephone Note  I connected with  Nicole Phelps on 11/20/2022 by telephone.  I verified that I am speaking with the correct person using two identifiers.    I discussed the limitations of evaluation and management by telemedicine and the availability of in person appointments. The patient expressed understanding and agreed to proceed.  History of Present Illness:  Pulse 89   SpO2 97%  62 y.o. patient contacted office reporting URI sx sorethroat, cough, congestion, HA, diarrhea. she tested positive by in home test today. OV was conducted by telephone to minimize exposure. This patient has been vaccinated for covid 19, last 01/2022  Sx began 3 days ago with sore throat  Treatments tried so far: antihistamine, mucinex  Exposures: unknown   Medications   Current Outpatient Medications (Cardiovascular):    diltiazem (CARDIZEM CD) 120 MG 24 hr capsule, Take 1 capsule Daily for BP   rosuvastatin (CRESTOR) 10 MG tablet, Take 1 tablet Daily for Cholesterol  Current Outpatient Medications (Respiratory):    albuterol (VENTOLIN HFA) 108 (90 Base) MCG/ACT inhaler, USE 2 PUFFS EVERY 6 HOURS  AS NEEDED FOR SHORTNESS OF  BREATH   azelastine (ASTELIN) 0.1 % nasal spray, Place 2 sprays into both nostrils 2 (two) times daily. Use in each nostril as directed   fluticasone-salmeterol (WIXELA INHUB) 100-50 MCG/ACT AEPB, Inhale 1 puff into the lungs 2 (two) times daily.   fluticasone-salmeterol (ADVAIR) 100-50 MCG/ACT AEPB, Inhale 1 puff into the lungs 2 (two) times daily. Rinse mouth and gargle after each use to avoid thrush. (Patient not taking: Reported on 11/20/2022)  Current Outpatient Medications (Analgesics):    Acetaminophen (TYLENOL ARTHRITIS PAIN PO), Take by mouth.   traMADol (ULTRAM) 50 MG tablet,    Current Outpatient  Medications (Other):    busPIRone (BUSPAR) 15 MG tablet, Take on tablet three times a day for chronic anxiety.   cholecalciferol (VITAMIN D) 1000 UNITS tablet, Take 2,000 Units by mouth daily.    escitalopram (LEXAPRO) 20 MG tablet, Take 1 tablet Daily for Mood   MAGNESIUM PO, magnesium   Multiple Vitamin (MULTIVITAMIN) capsule, Take 1 capsule by mouth daily.   Multiple Vitamins-Minerals (ZINC PO), zinc   pantoprazole (PROTONIX) 40 MG tablet, Take 40 mg by mouth 2 (two) times daily.   Calcium Carb-Cholecalciferol (CALCIUM 1000 + D) 1000-20 MG-MCG TABS,    famotidine (PEPCID) 20 MG tablet, TAKE 1 TABLET BY MOUTH TWICE A DAY (Patient not taking: Reported on 11/20/2022)   psyllium (REGULOID) 0.52 g capsule, Take 0.52 g by mouth daily. (Patient not taking: Reported on 08/14/2022)  Allergies:  Allergies  Allergen Reactions   Other Shortness Of Breath    Household bleach- "takes my breath away"   Azithromycin Hives   Celexa [Citalopram Hydrobromide]     Myalgias   Trintellix [Vortioxetine] Other (See Comments)    Dysphoria    Vicodin [Hydrocodone-Acetaminophen] Nausea And Vomiting   Wellbutrin [Bupropion]     anxiety    Problem list She has Hypertension, essential; Hyperlipidemia, mixed; Depression; Vitamin D deficiency; Obesity (BMI 30.0-34.9); Smoker; Asthma; Benign hematuria; B12 deficiency; Chronic obstructive pulmonary disease (HCC); Mild CAD; Anxiety state; Aortic atherosclerosis (HCC) - CT 09/2020; Osteopenia of necks of both femurs; Hepatic steatosis; Abnormal Pap smear of vagina; and Abnormal glucose on their problem list.   Social History:  reports that she has been smoking cigarettes. She started smoking about 47 years ago. She has a 23.8 pack-year smoking history. She has never used smokeless tobacco. She reports current alcohol use. She reports current drug use. Drug: Marijuana.  Observations/Objective:  General : Well sounding patient in no apparent distress HEENT: no  hoarseness, no cough for duration of visit Lungs: speaks in complete sentences, no audible wheezing, no apparent distress Neurological: alert, oriented x 3 Psychiatric: pleasant, judgement appropriate   Assessment and Plan:  Covid 19 Covid 19 positive per rapid screening test in home Risk factors include: Hypertension, essential; Hyperlipidemia, mixed; Depression; Vitamin D deficiency; Obesity (BMI 30.0-34.9); Smoker; Asthma; Benign hematuria; B12 deficiency; Chronic obstructive pulmonary disease (HCC); Mild CAD; Anxiety state; Aortic atherosclerosis (HCC) - CT 09/2020; Osteopenia of necks of both femurs; Hepatic steatosis; Abnormal Pap smear of vagina; and Abnormal glucose. Symptoms are: mild Due to co morbid conditions and risk factors, discussed antivirals  Immue support reviewed Vitamin C, Vitamin D, Zinc Take tylenol PRN temp 101+ Push hydration Regular ambulation or calf exercises exercises for clot prevention and 81 mg ASA unless contraindicated Sx supportive therapy suggested Follow up via mychart or telephone if needed Advised patient obtain O2 monitor; present to ED if persistently <90% or with severe dyspnea, CP, fever uncontrolled by tylenol, confusion, sudden decline       Should remain in isolation 5 days from testing positive and then wear a mask when around other people for the following 5 days  COVID-19  - predniSONE (DELTASONE) 10 MG tablet; 1 tab 3 x day for 2 days, then 1 tab 2 x day for 2 days, then 1 tab 1 x day for 3 days  Dispense: 13 tablet; Refill: 0 - benzonatate (TESSALON) 200 MG capsule; Take 1 perle 3 x / day to prevent cough  Dispense: 30 capsule; Refill: 1 - albuterol (VENTOLIN HFA) 108 (90 Base) MCG/ACT inhaler; USE 2 PUFFS EVERY 6 HOURS  AS NEEDED FOR SHORTNESS OF  BREATH  Dispense: 48 g; Refill: 1    Follow Up Instructions:  I discussed the assessment and treatment plan with the patient. The patient was provided an opportunity to ask questions and all  were answered. The patient agreed with the plan and demonstrated an understanding of the instructions.   The patient was advised to call back or seek an in-person evaluation if the symptoms worsen or if the condition fails to improve as anticipated.  I provided 15 minutes of non-face-to-face time during this encounter.   Adela Glimpse, NP

## 2023-01-30 ENCOUNTER — Encounter: Payer: Medicare Other | Admitting: Nurse Practitioner

## 2023-02-07 ENCOUNTER — Other Ambulatory Visit: Payer: Self-pay | Admitting: Nurse Practitioner

## 2023-02-07 DIAGNOSIS — I10 Essential (primary) hypertension: Secondary | ICD-10-CM

## 2023-02-07 DIAGNOSIS — F411 Generalized anxiety disorder: Secondary | ICD-10-CM

## 2023-02-26 ENCOUNTER — Other Ambulatory Visit: Payer: Self-pay | Admitting: Nurse Practitioner

## 2023-02-26 ENCOUNTER — Encounter: Payer: Self-pay | Admitting: Nurse Practitioner

## 2023-02-26 ENCOUNTER — Ambulatory Visit (INDEPENDENT_AMBULATORY_CARE_PROVIDER_SITE_OTHER): Payer: Medicare Other | Admitting: Nurse Practitioner

## 2023-02-26 VITALS — BP 130/78 | HR 61 | Temp 98.0°F | Ht 65.0 in | Wt 196.6 lb

## 2023-02-26 DIAGNOSIS — K222 Esophageal obstruction: Secondary | ICD-10-CM | POA: Diagnosis not present

## 2023-02-26 DIAGNOSIS — B372 Candidiasis of skin and nail: Secondary | ICD-10-CM

## 2023-02-26 DIAGNOSIS — R066 Hiccough: Secondary | ICD-10-CM | POA: Diagnosis not present

## 2023-02-26 DIAGNOSIS — F17200 Nicotine dependence, unspecified, uncomplicated: Secondary | ICD-10-CM

## 2023-02-26 DIAGNOSIS — Z800 Family history of malignant neoplasm of digestive organs: Secondary | ICD-10-CM | POA: Diagnosis not present

## 2023-02-26 DIAGNOSIS — Z136 Encounter for screening for cardiovascular disorders: Secondary | ICD-10-CM | POA: Diagnosis not present

## 2023-02-26 DIAGNOSIS — E782 Mixed hyperlipidemia: Secondary | ICD-10-CM

## 2023-02-26 DIAGNOSIS — K5790 Diverticulosis of intestine, part unspecified, without perforation or abscess without bleeding: Secondary | ICD-10-CM | POA: Diagnosis not present

## 2023-02-26 DIAGNOSIS — J45909 Unspecified asthma, uncomplicated: Secondary | ICD-10-CM

## 2023-02-26 DIAGNOSIS — F3341 Major depressive disorder, recurrent, in partial remission: Secondary | ICD-10-CM

## 2023-02-26 DIAGNOSIS — Z0000 Encounter for general adult medical examination without abnormal findings: Secondary | ICD-10-CM

## 2023-02-26 DIAGNOSIS — E538 Deficiency of other specified B group vitamins: Secondary | ICD-10-CM

## 2023-02-26 DIAGNOSIS — K760 Fatty (change of) liver, not elsewhere classified: Secondary | ICD-10-CM

## 2023-02-26 DIAGNOSIS — E785 Hyperlipidemia, unspecified: Secondary | ICD-10-CM

## 2023-02-26 DIAGNOSIS — I10 Essential (primary) hypertension: Secondary | ICD-10-CM

## 2023-02-26 DIAGNOSIS — Z860101 Personal history of adenomatous and serrated colon polyps: Secondary | ICD-10-CM | POA: Diagnosis not present

## 2023-02-26 DIAGNOSIS — R0681 Apnea, not elsewhere classified: Secondary | ICD-10-CM

## 2023-02-26 DIAGNOSIS — F411 Generalized anxiety disorder: Secondary | ICD-10-CM

## 2023-02-26 DIAGNOSIS — E66811 Obesity, class 1: Secondary | ICD-10-CM

## 2023-02-26 DIAGNOSIS — K2270 Barrett's esophagus without dysplasia: Secondary | ICD-10-CM | POA: Diagnosis not present

## 2023-02-26 DIAGNOSIS — J449 Chronic obstructive pulmonary disease, unspecified: Secondary | ICD-10-CM

## 2023-02-26 DIAGNOSIS — Z1389 Encounter for screening for other disorder: Secondary | ICD-10-CM

## 2023-02-26 DIAGNOSIS — Z0001 Encounter for general adult medical examination with abnormal findings: Secondary | ICD-10-CM

## 2023-02-26 DIAGNOSIS — Z79899 Other long term (current) drug therapy: Secondary | ICD-10-CM

## 2023-02-26 DIAGNOSIS — E559 Vitamin D deficiency, unspecified: Secondary | ICD-10-CM

## 2023-02-26 DIAGNOSIS — K449 Diaphragmatic hernia without obstruction or gangrene: Secondary | ICD-10-CM | POA: Diagnosis not present

## 2023-02-26 DIAGNOSIS — R7309 Other abnormal glucose: Secondary | ICD-10-CM

## 2023-02-26 MED ORDER — TERBINAFINE HCL 250 MG PO TABS: 250.0000 mg | ORAL_TABLET | Freq: Every day | ORAL | 0 refills | Status: AC

## 2023-02-26 MED ORDER — CLOTRIMAZOLE-BETAMETHASONE 1-0.05 % EX CREA: 1.0000 | TOPICAL_CREAM | Freq: Two times a day (BID) | CUTANEOUS | 2 refills | Status: AC

## 2023-02-26 NOTE — Patient Instructions (Signed)

## 2023-02-26 NOTE — Progress Notes (Signed)
CPE Assessment:   CPE Due annually  Health Maintenance reviewed Healthy lifestyle reviewed and goals set   Essential hypertension Discussed DASH (Dietary Approaches to Stop Hypertension) DASH diet is lower in sodium than a typical American diet. Cut back on foods that are high in saturated fat, cholesterol, and trans fats. Eat more whole-grain foods, fish, poultry, and nuts Remain active and exercise as tolerated daily.  Monitor BP at home-Call if greater than 130/80.  Check CMP/CBC     Hyperlipidemia, unspecified hyperlipidemia type Discussed lifestyle modifications. Recommended diet heavy in fruits and veggies, omega 3's. Decrease consumption of animal meats, cheeses, and dairy products. Remain active and exercise as tolerated. Continue to monitor. Check lipids/TSH     Recurrent major depressive disorder, in partial remission (HCC) Continue medications: Escitalopram 20mg  daily Discussed stress management techniques  Discussed, increase water,intake & good sleep hygiene  Discussed increasing exercise & vegetables in diet   Anxiety state Managing well at this time Stress management techniques discussed, increase water, good sleep hygiene discussed, increase exercise, and increase veggies.    Chronic obstructive pulmonary disease, unspecified COPD type (HCC)  Mild intermittent asthma without complication No triggers, well controlled symptoms, cont to monitor Continue Advair BID, rinse mouth after use Smoking cessation instruction/counseling given:  counseled patient on the dangers of tobacco use, advised patient to stop smoking, and reviewed strategies to maximize success     Smoker Discussed smoking cessation, benefits Discussed health risks of continued behavior Not ready to quit at this time Will continue to assess readiness  UTD annual screening, last 09/2020 - overdue    B12 deficiency Continue Vitamin B12   Vitamin D deficiency Continue supplementation to  maintain goal of 70-100 Check and monitor levels   Abnormal glucose Education: Reviewed 'ABCs' of diabetes management  Discussed goals to be met and/or maintained include A1C (<7) Blood pressure (<130/80) Cholesterol (LDL <70) Continue Eye Exam yearly  Continue Dental Exam Q6 mo Discussed dietary recommendations Discussed Physical Activity recommendations Check A1C    Hepatic steatosis Per CT 02/12/20 - Resolved Avoid tylenol, alcohol, monitor weight. Continue to monitor   Obesity (BMI 35.0-39.9) with comorbidity (HCC) Discussed appropriate BMI Diet modification. Physical activity. Encouraged/praised to build confidence.  Medication management All medications discussed and reviewed in full. All questions and concerns regarding medications addressed.    Medication management All medications discussed and reviewed in full. All questions and concerns regarding medications addressed.    Candida of skin Start tmt with Terbinafine Apply topical clotrimazole with betamethasone Refer back to Dermatology if s/s fail to improve  Apneic episode Noted per anaesthesilogy during recent EGD Refer for in home sleep study - patient does not want to stay overnight.    Orders Placed This Encounter  Procedures   CBC with Differential/Platelet   COMPLETE METABOLIC PANEL WITH GFR   Magnesium   Lipid panel   TSH   Hemoglobin A1c   Insulin, random   VITAMIN D 25 Hydroxy (Vit-D Deficiency, Fractures)   Urinalysis, Routine w reflex microscopic   Microalbumin / creatinine urine ratio   Vitamin B12   Ambulatory referral to Sleep Studies    Referral Priority:   Routine    Referral Type:   Consultation    Referral Reason:   Specialty Services Required    Requested Specialty:   Sleep Medicine    Number of Visits Requested:   1   EKG 12-Lead   Meds ordered this encounter  Medications   terbinafine (LAMISIL) 250 MG tablet  Sig: Take 1 tablet (250 mg total) by mouth daily. Take 1  pill daily for one month, off for 1 month, one a month, etc until gone    Dispense:  90 tablet    Refill:  0    Supervising Provider:   Lucky Cowboy [phone]   clotrimazole-betamethasone (LOTRISONE) cream    Sig: Apply 1 Application topically 2 (two) times daily.    Dispense:  15 g    Refill:  2    Supervising Provider:   Lucky Cowboy [phone]   Notify office for further evaluation and treatment, questions or concerns if any reported s/s fail to improve.   The patient was advised to call back or seek an in-person evaluation if any symptoms worsen or if the condition fails to improve as anticipated.   Further disposition pending results of labs. Discussed med's effects and SE's.    I discussed the assessment and treatment plan with the patient. The patient was provided an opportunity to ask questions and all were answered. The patient agreed with the plan and demonstrated an understanding of the instructions.  Discussed med's effects and SE's. Screening labs and tests as requested with regular follow-up as recommended.  I provided 35 minutes of face-to-face time during this encounter including counseling, chart review, and critical decision making was preformed.  Today's Plan of Care is based on a patient-centered health care approach known as shared decision making - the decisions, tests and treatments allow for patient preferences and values to be balanced with clinical evidence.      Future Appointments  Date Time Provider Department Center  08/14/2023 10:30 AM Adela Glimpse, NP [facility] None  03/08/2024  3:00 PM Adela Glimpse, NP [facility] None   Subjective:  Nicole Phelps is a 62 y.o. female who presents for a follow up. He has Hypertension, essential; Hyperlipidemia, mixed; Depression; Vitamin D deficiency; Obesity (BMI 30.0-34.9); Smoker; Asthma; Benign hematuria; B12 deficiency; Chronic obstructive pulmonary disease (HCC); Mild CAD; Anxiety state; Aortic atherosclerosis  (HCC) - CT 09/2020; Osteopenia of necks of both femurs; Hepatic steatosis; Abnormal Pap smear of vagina; and Abnormal glucose on their problem list.  She is married, 2 children, 9 grandgrands. She is retired Engineer, manufacturing.   She continues to have skin candida.  Followed by Dermatology.  States that she had a culture that showed fungal infection.  She was treated with Ciclopirox and does not feel as though she has had any improvement.    Shares that she was told by the anesthesiologist during her last EGD that she had OSA.  She does report having episodes of falling asleep and waking up suddenly.  She is unable to stay overnight for a sleep evaluation.     She is a smoker (22.5 pack year history), Has COPD/asthma and uses Advair and albuterol. She had normal CXR 04/2020. Insurance changed and won't cover advair, will check for preferred agent. She had low dose lung cancer screening 09/13/2020 that was benign, recommended annual follow up. She is overdue.   BMI is Body mass index is 32.72 kg/m., she has been working on diet and exercise. Wt Readings from Last 3 Encounters:  02/26/23 196 lb 9.6 oz (89.2 kg)  08/14/22 191 lb 6.4 oz (86.8 kg)  05/06/22 191 lb 6.4 oz (86.8 kg)   She had Ct coronary 02/2018 that showed mild disease, medically treated. Aortic atherosclerosis per CT 09/2020.   Her blood pressure has been controlled at home, today their BP is BP: 130/78 She  does not workout. She denies chest pain, shortness of breath, dizziness.  She is on cholesterol medication Rosuvastatin and denies myalgias. Her cholesterol is at goal. The cholesterol last visit was:   Lab Results  Component Value Date   CHOL 137 08/14/2022   HDL 63 08/14/2022   LDLCALC 55 08/14/2022   TRIG 104 08/14/2022   CHOLHDL 2.2 08/14/2022   She has been working on diet and exercise for prediabetes, and denies polydipsia and polyuria. Last A1C in the office was:  Lab Results  Component Value Date   HGBA1C 5.5 05/06/2022    Last GFR Lab Results  Component Value Date   EGFR 69 08/14/2022   Patient is on Vitamin D supplement.   Lab Results  Component Value Date   VD25OH 42 05/06/2022      Medication Review:  Current Outpatient Medications (Endocrine & Metabolic):    predniSONE (DELTASONE) 10 MG tablet, 1 tab 3 x day for 2 days, then 1 tab 2 x day for 2 days, then 1 tab 1 x day for 3 days  Current Outpatient Medications (Cardiovascular):    diltiazem (CARDIZEM CD) 120 MG 24 hr capsule, TAKE 1 CAPSULE DAILY FOR BLOOD PRESSURE   rosuvastatin (CRESTOR) 10 MG tablet, TAKE 1 TABLET BY MOUTH EVERY DAY FOR CHOLESTEROL  Current Outpatient Medications (Respiratory):    albuterol (VENTOLIN HFA) 108 (90 Base) MCG/ACT inhaler, USE 2 PUFFS EVERY 6 HOURS  AS NEEDED FOR SHORTNESS OF  BREATH   benzonatate (TESSALON) 200 MG capsule, Take 1 perle 3 x / day to prevent cough   fluticasone-salmeterol (WIXELA INHUB) 100-50 MCG/ACT AEPB, Inhale 1 puff into the lungs 2 (two) times daily.   azelastine (ASTELIN) 0.1 % nasal spray, Place 2 sprays into both nostrils 2 (two) times daily. Use in each nostril as directed   fluticasone-salmeterol (ADVAIR) 100-50 MCG/ACT AEPB, Inhale 1 puff into the lungs 2 (two) times daily. Rinse mouth and gargle after each use to avoid thrush. (Patient not taking: Reported on 02/26/2023)  Current Outpatient Medications (Analgesics):    Acetaminophen (TYLENOL ARTHRITIS PAIN PO), Take by mouth.   traMADol (ULTRAM) 50 MG tablet,    Current Outpatient Medications (Other):    busPIRone (BUSPAR) 15 MG tablet, TAKE 1 TABLET BY MOUTH THREE TIMES A DAY FOR CHRONIC ANXIETY   cholecalciferol (VITAMIN D) 1000 UNITS tablet, Take 2,000 Units by mouth daily.    clotrimazole-betamethasone (LOTRISONE) cream, Apply 1 Application topically 2 (two) times daily.   escitalopram (LEXAPRO) 20 MG tablet, TAKE 1 TABLET BY MOUTH DAILY FOR MOOD   MAGNESIUM PO, magnesium   Multiple Vitamins-Minerals (ZINC PO), zinc    pantoprazole (PROTONIX) 40 MG tablet, Take 40 mg by mouth 2 (two) times daily.   terbinafine (LAMISIL) 250 MG tablet, Take 1 tablet (250 mg total) by mouth daily. Take 1 pill daily for one month, off for 1 month, one a month, etc until gone   Calcium Carb-Cholecalciferol (CALCIUM 1000 + D) 1000-20 MG-MCG TABS,    famotidine (PEPCID) 20 MG tablet, TAKE 1 TABLET BY MOUTH TWICE A DAY (Patient not taking: Reported on 02/26/2023)   Multiple Vitamin (MULTIVITAMIN) capsule, Take 1 capsule by mouth daily. (Patient not taking: Reported on 02/26/2023)   psyllium (REGULOID) 0.52 g capsule, Take 0.52 g by mouth daily. (Patient not taking: Reported on 02/26/2023)  Allergies: Allergies  Allergen Reactions   Other Shortness Of Breath    Household bleach- "takes my breath away"   Azithromycin Hives   Celexa [Citalopram Hydrobromide]  Myalgias   Trintellix [Vortioxetine] Other (See Comments)    Dysphoria    Vicodin [Hydrocodone-Acetaminophen] Nausea And Vomiting   Wellbutrin [Bupropion]     anxiety    Current Problems (verified) has Hypertension, essential; Hyperlipidemia, mixed; Depression; Vitamin D deficiency; Obesity (BMI 30.0-34.9); Smoker; Asthma; Benign hematuria; B12 deficiency; Chronic obstructive pulmonary disease (HCC); Mild CAD; Anxiety state; Aortic atherosclerosis (HCC) - CT 09/2020; Osteopenia of necks of both femurs; Hepatic steatosis; Abnormal Pap smear of vagina; and Abnormal glucose on their problem list.  Screening Tests Immunization History  Administered Date(s) Administered   Influenza Inj Mdck Quad Pf 12/13/2017   Influenza,inj,Quad PF,6+ Mos 12/18/2018   Influenza,trivalent, recombinat, inj, PF 01/22/2023   Influenza-Unspecified 12/30/2020   Moderna Sars-Covid-2 Vaccination 02/20/2020   PFIZER Comirnaty(Gray Top)Covid-19 Tri-Sucrose Vaccine 05/27/2019, 06/17/2019, 01/15/2022   PFIZER(Purple Top)SARS-COV-2 Vaccination 10/22/2020   PNEUMOCOCCAL CONJUGATE-20 02/01/2023    Pneumococcal-Unspecified 04/03/2009   Td 04/03/2004, 01/25/2021   Zoster Recombinant(Shingrix) 06/18/2020, 08/20/2020   Health Maintenance  Topic Date Due   COVID-19 Vaccine (6 - 2024-25 season) 11/10/2022   Medicare Annual Wellness (AWV)  08/14/2023   Lung Cancer Screening  09/17/2023   MAMMOGRAM  06/09/2024   DTaP/Tdap/Td (3 - Tdap) 01/26/2031   Colonoscopy  06/30/2032   INFLUENZA VACCINE  Completed   Hepatitis C Screening  Completed   HIV Screening  Completed   Zoster Vaccines- Shingrix  Completed   HPV VACCINES  Aged Out    Last colonoscopy: 06/2022 Mammogram:  06/2022 Pap:  Done DEXA:  Due age 80  Names of Other Physician/Practitioners you currently use: 1. [facility] here for primary care 2. Eye doctor, last visit 2024 - plans to follow up 3. [facility] [place], dentist, 06/2022 every 6 mo 4. Plans to schedule , derm  Patient Care Team: Lucky Cowboy, MD as PCP - General (Internal Medicine) Sharrell Ku, MD as Consulting Physician (Gastroenterology) Earle Gell, RN as Technician (Urology) Charlett Nose, [facility] (Inactive) as Pharmacist (Pharmacist)  Surgical: She  has a past surgical history that includes Tubal ligation; Breast surgery (Left); Knee arthroscopy w/ debridement (Right, 11/2017); Ankle surgery (Left, 2017); Total knee arthroplasty (Right, 02/2019); Total abdominal hysterectomy (2003); and Blepharoplasty (Bilateral, 04/09/2021). Family Her family history includes Arthritis in her mother; Breast cancer (age of onset: 77) in her sister; Colon cancer in her maternal aunt, maternal aunt, and maternal grandmother; Colon cancer (age of onset: 46) in her mother; Heart attack in her paternal grandfather; Hypertension in her father; Stroke in her father. Social history  She reports that she has been smoking cigarettes. She started smoking about 47 years ago. She has a 24 pack-year smoking history. She has never used smokeless  tobacco. She reports current alcohol use. She reports current drug use. Drug: Marijuana. Objective:   Today's Vitals   02/26/23 1454  BP: 130/78  Pulse: 61  Temp: 98 F (36.7 C)  SpO2: 97%  Weight: 196 lb 9.6 oz (89.2 kg)  Height: 5\' 5"  (1.651 m)    Body mass index is 32.72 kg/m.  General appearance: alert, no distress, WD/WN, female HEENT: normocephalic, sclerae anicteric, TMs pearly, nares patent, no discharge or erythema, pharynx normal Oral cavity: MMM, no lesions Neck: supple, no lymphadenopathy, no thyromegaly, no masses Heart: RRR, normal S1, S2, no murmurs Lungs: CTA bilaterally, no wheezes, rhonchi, or rales Abdomen: +bs, soft, non tender, non distended, no masses, no hepatomegaly, no splenomegaly Musculoskeletal: nontender, no swelling, no obvious deformity Extremities: no edema, no cyanosis, no  clubbing Pulses: 2+ symmetric, upper and lower extremities, normal cap refill Neurological: alert, oriented x 3, CN2-12 intact, strength normal upper extremities and lower extremities, sensation normal throughout, DTRs 2+ throughout, no cerebellar signs, gait normal Psychiatric: normal affect, behavior normal, pleasant   EKG:  SB  Meaghen Vecchiarelli, NP   02/26/2023

## 2023-02-27 ENCOUNTER — Telehealth: Payer: Self-pay | Admitting: Nurse Practitioner

## 2023-02-27 ENCOUNTER — Encounter: Payer: Self-pay | Admitting: Nurse Practitioner

## 2023-02-27 LAB — CBC WITH DIFFERENTIAL/PLATELET
Absolute Lymphocytes: 2834 {cells}/uL (ref 850–3900)
Absolute Monocytes: 555 {cells}/uL (ref 200–950)
Basophils Absolute: 59 {cells}/uL (ref 0–200)
Basophils Relative: 0.8 %
Eosinophils Absolute: 178 {cells}/uL (ref 15–500)
Eosinophils Relative: 2.4 %
HCT: 41.9 % (ref 35.0–45.0)
Hemoglobin: 14 g/dL (ref 11.7–15.5)
MCH: 30.4 pg (ref 27.0–33.0)
MCHC: 33.4 g/dL (ref 32.0–36.0)
MCV: 90.9 fL (ref 80.0–100.0)
MPV: 11 fL (ref 7.5–12.5)
Monocytes Relative: 7.5 %
Neutro Abs: 3774 {cells}/uL (ref 1500–7800)
Neutrophils Relative %: 51 %
Platelets: 293 10*3/uL (ref 140–400)
RBC: 4.61 10*6/uL (ref 3.80–5.10)
RDW: 12.6 % (ref 11.0–15.0)
Total Lymphocyte: 38.3 %
WBC: 7.4 10*3/uL (ref 3.8–10.8)

## 2023-02-27 LAB — COMPLETE METABOLIC PANEL WITH GFR
AG Ratio: 1.9 (calc) (ref 1.0–2.5)
ALT: 9 U/L (ref 6–29)
AST: 12 U/L (ref 10–35)
Albumin: 4.2 g/dL (ref 3.6–5.1)
Alkaline phosphatase (APISO): 83 U/L (ref 37–153)
BUN: 14 mg/dL (ref 7–25)
CO2: 27 mmol/L (ref 20–32)
Calcium: 9.5 mg/dL (ref 8.6–10.4)
Chloride: 104 mmol/L (ref 98–110)
Creat: 0.98 mg/dL (ref 0.50–1.05)
Globulin: 2.2 g/dL (ref 1.9–3.7)
Glucose, Bld: 89 mg/dL (ref 65–99)
Potassium: 3.6 mmol/L (ref 3.5–5.3)
Sodium: 140 mmol/L (ref 135–146)
Total Bilirubin: 0.6 mg/dL (ref 0.2–1.2)
Total Protein: 6.4 g/dL (ref 6.1–8.1)
eGFR: 65 mL/min/{1.73_m2} (ref >=60)

## 2023-02-27 LAB — LIPID PANEL
Cholesterol: 136 mg/dL (ref <200)
HDL: 55 mg/dL (ref >=50)
LDL Cholesterol (Calc): 60 mg/dL
Non-HDL Cholesterol (Calc): 81 mg/dL (ref <130)
Total CHOL/HDL Ratio: 2.5 (calc) (ref <5.0)
Triglycerides: 119 mg/dL (ref <150)

## 2023-02-27 LAB — INSULIN, RANDOM: Insulin: 8.9 u[IU]/mL

## 2023-02-27 LAB — MICROSCOPIC MESSAGE

## 2023-02-27 LAB — HEMOGLOBIN A1C
Hgb A1c MFr Bld: 5.4 %{Hb} (ref <5.7)
Mean Plasma Glucose: 108 mg/dL
eAG (mmol/L): 6 mmol/L

## 2023-02-27 LAB — MICROALBUMIN / CREATININE URINE RATIO
Creatinine, Urine: 162 mg/dL (ref 20–275)
Microalb Creat Ratio: 4 mg/g{creat} (ref <30)
Microalb, Ur: 0.7 mg/dL

## 2023-02-27 LAB — TSH: TSH: 2.63 m[IU]/L (ref 0.40–4.50)

## 2023-02-27 LAB — URINALYSIS, ROUTINE W REFLEX MICROSCOPIC
Bilirubin Urine: NEGATIVE
Glucose, UA: NEGATIVE
Hyaline Cast: NONE SEEN /[LPF]
Ketones, ur: NEGATIVE
Leukocytes,Ua: NEGATIVE
Nitrite: NEGATIVE
Protein, ur: NEGATIVE
Specific Gravity, Urine: 1.017 (ref 1.001–1.035)
pH: 5.5 (ref 5.0–8.0)

## 2023-02-27 LAB — VITAMIN D 25 HYDROXY (VIT D DEFICIENCY, FRACTURES): Vit D, 25-Hydroxy: 37 ng/mL (ref 30–100)

## 2023-02-27 LAB — MAGNESIUM: Magnesium: 1.9 mg/dL (ref 1.5–2.5)

## 2023-02-27 LAB — VITAMIN B12: Vitamin B-12: 638 pg/mL (ref 200–1100)

## 2023-02-27 NOTE — Telephone Encounter (Signed)
Patient called for lab results. At the end of the call, she asked if Archie Patten had mentioned in the notes about Adderall or weight loss shot. I told her no and I would send a message back. Does this ring any bells?

## 2023-03-06 NOTE — Telephone Encounter (Signed)
Patient states that she was told at one her visits here when they did the EKG, it showed that she had a heart attack prior.  Patient understands that we won't be able to prescribe her Phentermine and that she is not interested in doing the injections after doing research.

## 2023-04-24 DIAGNOSIS — I252 Old myocardial infarction: Secondary | ICD-10-CM | POA: Diagnosis not present

## 2023-04-24 DIAGNOSIS — I129 Hypertensive chronic kidney disease with stage 1 through stage 4 chronic kidney disease, or unspecified chronic kidney disease: Secondary | ICD-10-CM | POA: Diagnosis not present

## 2023-04-24 DIAGNOSIS — I2510 Atherosclerotic heart disease of native coronary artery without angina pectoris: Secondary | ICD-10-CM | POA: Diagnosis not present

## 2023-04-24 DIAGNOSIS — Z823 Family history of stroke: Secondary | ICD-10-CM | POA: Diagnosis not present

## 2023-04-24 DIAGNOSIS — I700 Atherosclerosis of aorta: Secondary | ICD-10-CM | POA: Diagnosis not present

## 2023-04-24 DIAGNOSIS — K219 Gastro-esophageal reflux disease without esophagitis: Secondary | ICD-10-CM | POA: Diagnosis not present

## 2023-04-24 DIAGNOSIS — Z809 Family history of malignant neoplasm, unspecified: Secondary | ICD-10-CM | POA: Diagnosis not present

## 2023-04-24 DIAGNOSIS — M1990 Unspecified osteoarthritis, unspecified site: Secondary | ICD-10-CM | POA: Diagnosis not present

## 2023-04-24 DIAGNOSIS — J4489 Other specified chronic obstructive pulmonary disease: Secondary | ICD-10-CM | POA: Diagnosis not present

## 2023-04-24 DIAGNOSIS — F411 Generalized anxiety disorder: Secondary | ICD-10-CM | POA: Diagnosis not present

## 2023-04-24 DIAGNOSIS — F324 Major depressive disorder, single episode, in partial remission: Secondary | ICD-10-CM | POA: Diagnosis not present

## 2023-04-24 DIAGNOSIS — E785 Hyperlipidemia, unspecified: Secondary | ICD-10-CM | POA: Diagnosis not present

## 2023-05-19 ENCOUNTER — Ambulatory Visit: Payer: Self-pay | Admitting: Internal Medicine

## 2023-05-19 NOTE — Telephone Encounter (Signed)
  Chief Complaint: Left breast slightly swollen and painful Symptoms: Above Frequency: 1 week Pertinent Negatives: Patient denies Redness, warmth Disposition: [] ED /[] Urgent Care (no appt availability in office) / [] Appointment(In office/virtual)/ [x]  [facility]/ [] Home Care/ [] Refused Recommended Disposition /[] [facility]/ []  Follow-up with PCP Additional Notes: Pt states that left breast has been painful for about 1 week. Pain is outer and below arm pit.  No appts available at nearby clinics pt has decided on a vv. Scheduled for tomorrow morning. Pt was having trouble getting Mychart to open. Gave pt phone number for MyChart assistance.   Copied from CRM [id number]. Topic: Clinical - Red Word Triage >> May 19, 2023  4:00 PM Whitney O wrote: [facility] that prompted transfer to Nurse Triage: pain in keft breast Reason for Disposition  Change in shape or appearance of breast  Answer Assessment - Initial Assessment Questions 1. SYMPTOM: "What's the main symptom you're concerned about?"  (e.g., lump, pain, rash, nipple discharge)     Pain with palpation  2. LOCATION: "Where is the pain located?"     Left breast under arm 3. ONSET: "When did pain  start?"     1 week 4. PRIOR HISTORY: "Do you have any history of prior problems with your breasts?" (e.g., lumps, cancer, fibrocystic breast disease)     2 lumps removed, I drained on right breast 5. CAUSE: "What do you think is causing this symptom?"     unsure 6. OTHER SYMPTOMS: "Do you have any other symptoms?" (e.g., fever, breast pain, redness or rash, nipple discharge)     swollen  Protocols used: Breast Symptoms-A-AH

## 2023-05-20 ENCOUNTER — Telehealth

## 2023-05-20 DIAGNOSIS — N61 Mastitis without abscess: Secondary | ICD-10-CM

## 2023-05-20 MED ORDER — DOXYCYCLINE HYCLATE 100 MG PO TABS
100.0000 mg | ORAL_TABLET | Freq: Two times a day (BID) | ORAL | 0 refills | Status: AC
Start: 2023-05-20 — End: ?

## 2023-05-20 NOTE — Progress Notes (Signed)
 Virtual Visit Consent   Nicole Phelps, you are scheduled for a virtual visit with a [facility] provider today. Just as with appointments in the office, your consent must be obtained to participate. Your consent will be active for this visit and any virtual visit you may have with one of our providers in the next 365 days. If you have a MyChart account, a copy of this consent can be sent to you electronically.  As this is a virtual visit, video technology does not allow for your provider to perform a traditional examination. This may limit your provider's ability to fully assess your condition. If your provider identifies any concerns that need to be evaluated in person or the need to arrange testing (such as labs, EKG, etc.), we will make arrangements to do so. Although advances in technology are sophisticated, we cannot ensure that it will always work on either your end or our end. If the connection with a video visit is poor, the visit may have to be switched to a telephone visit. With either a video or telephone visit, we are not always able to ensure that we have a secure connection.  By engaging in this virtual visit, you consent to the provision of healthcare and authorize for your insurance to be billed (if applicable) for the services provided during this visit. Depending on your insurance coverage, you may receive a charge related to this service.  I need to obtain your verbal consent now. Are you willing to proceed with your visit today? Nicole Phelps has provided verbal consent on 05/20/2023 for a virtual visit (video or telephone). Nicole Phelps, [place]  Date: 05/20/2023 9:43 AM   Virtual Visit via Video Note   I, Nicole Phelps, connected with  Nicole Phelps  ([id number], 1960/06/20) on 05/20/23 at  9:45 AM EDT by a video-enabled telemedicine application and verified that I am speaking with the correct person using two identifiers.  Location: Patient: Virtual Visit Location  Patient: Home Provider: Virtual Visit Location Provider: Home Office   I discussed the limitations of evaluation and management by telemedicine and the availability of in person appointments. The patient expressed understanding and agreed to proceed.    History of Present Illness: ALEEZA Phelps is a 63 y.o. who identifies as a female who was assigned female at birth, and is being seen today for about a week of pain in her left breast radiating under her arm and on top of the breast. Pain is described as a soreness and a swollen sensation. Some warmth without visible redness. Denies fevers, chills. Denies trauma or injury. Some heavy lifting with her husband but that was after these symptoms started. Has been taking Tylenol arthritis OTC. Scheduled for a Mammogram 06/11/2023.  HPI: HPI  Problems:  Patient Active Problem List   Diagnosis Date Noted   Abnormal glucose 05/05/2022   Abnormal Pap smear of vagina 01/27/2021   Hepatic steatosis 01/26/2021   Osteopenia of necks of both femurs 01/24/2021   Aortic atherosclerosis (HCC) - CT 09/2020 09/14/2020   Anxiety state 02/15/2019   Mild CAD 03/06/2018   Chronic obstructive pulmonary disease (HCC) 12/25/2017   Obesity (BMI 30.0-34.9) 09/16/2014   Smoker 09/16/2014   Asthma 09/16/2014   Benign hematuria 09/16/2014   B12 deficiency 09/16/2014   Hypertension, essential    Hyperlipidemia, mixed    Depression    Vitamin D deficiency     Allergies:  Allergies  Allergen Reactions   Other Shortness  Of Breath    Household bleach- "takes my breath away"   Azithromycin Hives   Celexa [Citalopram Hydrobromide]     Myalgias   Trintellix [Vortioxetine] Other (See Comments)    Dysphoria    Vicodin [Hydrocodone-Acetaminophen] Nausea And Vomiting   Wellbutrin [Bupropion]     anxiety   Medications:  Current Outpatient Medications:    doxycycline (VIBRA-TABS) 100 MG tablet, Take 1 tablet (100 mg total) by mouth 2 (two) times daily., Disp: 14  tablet, Rfl: 0   Acetaminophen (TYLENOL ARTHRITIS PAIN PO), Take by mouth., Disp: , Rfl:    albuterol (VENTOLIN HFA) 108 (90 Base) MCG/ACT inhaler, USE 2 PUFFS EVERY 6 HOURS  AS NEEDED FOR SHORTNESS OF  BREATH, Disp: 48 g, Rfl: 1   azelastine (ASTELIN) 0.1 % nasal spray, Place 2 sprays into both nostrils 2 (two) times daily. Use in each nostril as directed, Disp: 30 mL, Rfl: 2   benzonatate (TESSALON) 200 MG capsule, Take 1 perle 3 x / day to prevent cough, Disp: 30 capsule, Rfl: 1   busPIRone (BUSPAR) 15 MG tablet, TAKE 1 TABLET BY MOUTH THREE TIMES A DAY FOR CHRONIC ANXIETY, Disp: 270 tablet, Rfl: 3   Calcium Carb-Cholecalciferol (CALCIUM 1000 + D) 1000-20 MG-MCG TABS, , Disp: , Rfl:    cholecalciferol (VITAMIN D) 1000 UNITS tablet, Take 2,000 Units by mouth daily. , Disp: , Rfl:    clotrimazole-betamethasone (LOTRISONE) cream, Apply 1 Application topically 2 (two) times daily., Disp: 15 g, Rfl: 2   diltiazem (CARDIZEM CD) 120 MG 24 hr capsule, TAKE 1 CAPSULE DAILY FOR BLOOD PRESSURE, Disp: 90 capsule, Rfl: 3   escitalopram (LEXAPRO) 20 MG tablet, TAKE 1 TABLET BY MOUTH DAILY FOR MOOD, Disp: 90 tablet, Rfl: 3   famotidine (PEPCID) 20 MG tablet, TAKE 1 TABLET BY MOUTH TWICE A DAY (Patient not taking: Reported on 02/26/2023), Disp: 180 tablet, Rfl: 1   fluticasone-salmeterol (ADVAIR) 100-50 MCG/ACT AEPB, Inhale 1 puff into the lungs 2 (two) times daily. Rinse mouth and gargle after each use to avoid thrush. (Patient not taking: Reported on 02/26/2023), Disp: 180 each, Rfl: 3   fluticasone-salmeterol (WIXELA INHUB) 100-50 MCG/ACT AEPB, Inhale 1 puff into the lungs 2 (two) times daily., Disp: , Rfl:    MAGNESIUM PO, magnesium, Disp: , Rfl:    Multiple Vitamin (MULTIVITAMIN) capsule, Take 1 capsule by mouth daily. (Patient not taking: Reported on 02/26/2023), Disp: , Rfl:    Multiple Vitamins-Minerals (ZINC PO), zinc, Disp: , Rfl:    pantoprazole (PROTONIX) 40 MG tablet, Take 40 mg by mouth 2 (two)  times daily., Disp: , Rfl:    psyllium (REGULOID) 0.52 g capsule, Take 0.52 g by mouth daily. (Patient not taking: Reported on 02/26/2023), Disp: , Rfl:    rosuvastatin (CRESTOR) 10 MG tablet, TAKE 1 TABLET BY MOUTH EVERY DAY FOR CHOLESTEROL, Disp: 90 tablet, Rfl: 3   terbinafine (LAMISIL) 250 MG tablet, Take 1 tablet (250 mg total) by mouth daily. Take 1 pill daily for one month, off for 1 month, one a month, etc until gone, Disp: 90 tablet, Rfl: 0   traMADol (ULTRAM) 50 MG tablet, , Disp: , Rfl:   Observations/Objective: Patient is well-developed, well-nourished in no acute distress.  Resting comfortably at home.  Head is normocephalic, atraumatic.  No labored breathing. Speech is clear and coherent with logical content.  Patient is alert and oriented at baseline.   Assessment and Plan: 1. Mastitis (Primary) - doxycycline (VIBRA-TABS) 100 MG tablet; Take 1 tablet (100  mg total) by mouth 2 (two) times daily.  Dispense: 14 tablet; Refill: 0  Concern for infective etiology. Supportive measures and OTC medications reviewed. Doxycycline per orders. Need in person follow-up if not quickly resolving, for detailed breast exam and imaging.   Follow Up Instructions: I discussed the assessment and treatment plan with the patient. The patient was provided an opportunity to ask questions and all were answered. The patient agreed with the plan and demonstrated an understanding of the instructions.  A copy of instructions were sent to the patient via MyChart unless otherwise noted below.   The patient was advised to call back or seek an in-person evaluation if the symptoms worsen or if the condition fails to improve as anticipated.    Nicole Climes, PA-C

## 2023-05-20 NOTE — Patient Instructions (Addendum)
 Nicole Phelps, thank you for joining Nicole Climes, PA-C for today's virtual visit.  While this provider is not your primary care provider (PCP), if your PCP is located in our provider database this encounter information will be shared with them immediately following your visit.   A [facility] MyChart account gives you access to today's visit and all your visits, tests, and labs performed at [facility] " click here if you don't have a [facility] MyChart account or go to [email or web address]  Consent: (Patient) Nicole Phelps provided verbal consent for this virtual visit at the beginning of the encounter.  Current Medications:  Current Outpatient Medications:    Acetaminophen (TYLENOL ARTHRITIS PAIN PO), Take by mouth., Disp: , Rfl:    albuterol (VENTOLIN HFA) 108 (90 Base) MCG/ACT inhaler, USE 2 PUFFS EVERY 6 HOURS  AS NEEDED FOR SHORTNESS OF  BREATH, Disp: 48 g, Rfl: 1   azelastine (ASTELIN) 0.1 % nasal spray, Place 2 sprays into both nostrils 2 (two) times daily. Use in each nostril as directed, Disp: 30 mL, Rfl: 2   benzonatate (TESSALON) 200 MG capsule, Take 1 perle 3 x / day to prevent cough, Disp: 30 capsule, Rfl: 1   busPIRone (BUSPAR) 15 MG tablet, TAKE 1 TABLET BY MOUTH THREE TIMES A DAY FOR CHRONIC ANXIETY, Disp: 270 tablet, Rfl: 3   Calcium Carb-Cholecalciferol (CALCIUM 1000 + D) 1000-20 MG-MCG TABS, , Disp: , Rfl:    cholecalciferol (VITAMIN D) 1000 UNITS tablet, Take 2,000 Units by mouth daily. , Disp: , Rfl:    clotrimazole-betamethasone (LOTRISONE) cream, Apply 1 Application topically 2 (two) times daily., Disp: 15 g, Rfl: 2   diltiazem (CARDIZEM CD) 120 MG 24 hr capsule, TAKE 1 CAPSULE DAILY FOR BLOOD PRESSURE, Disp: 90 capsule, Rfl: 3   escitalopram (LEXAPRO) 20 MG tablet, TAKE 1 TABLET BY MOUTH DAILY FOR MOOD, Disp: 90 tablet, Rfl: 3   famotidine (PEPCID) 20 MG tablet, TAKE 1 TABLET BY MOUTH TWICE A DAY (Patient not taking: Reported on 02/26/2023),  Disp: 180 tablet, Rfl: 1   fluticasone-salmeterol (ADVAIR) 100-50 MCG/ACT AEPB, Inhale 1 puff into the lungs 2 (two) times daily. Rinse mouth and gargle after each use to avoid thrush. (Patient not taking: Reported on 02/26/2023), Disp: 180 each, Rfl: 3   fluticasone-salmeterol (WIXELA INHUB) 100-50 MCG/ACT AEPB, Inhale 1 puff into the lungs 2 (two) times daily., Disp: , Rfl:    MAGNESIUM PO, magnesium, Disp: , Rfl:    Multiple Vitamin (MULTIVITAMIN) capsule, Take 1 capsule by mouth daily. (Patient not taking: Reported on 02/26/2023), Disp: , Rfl:    Multiple Vitamins-Minerals (ZINC PO), zinc, Disp: , Rfl:    pantoprazole (PROTONIX) 40 MG tablet, Take 40 mg by mouth 2 (two) times daily., Disp: , Rfl:    psyllium (REGULOID) 0.52 g capsule, Take 0.52 g by mouth daily. (Patient not taking: Reported on 02/26/2023), Disp: , Rfl:    rosuvastatin (CRESTOR) 10 MG tablet, TAKE 1 TABLET BY MOUTH EVERY DAY FOR CHOLESTEROL, Disp: 90 tablet, Rfl: 3   terbinafine (LAMISIL) 250 MG tablet, Take 1 tablet (250 mg total) by mouth daily. Take 1 pill daily for one month, off for 1 month, one a month, etc until gone, Disp: 90 tablet, Rfl: 0   traMADol (ULTRAM) 50 MG tablet, , Disp: , Rfl:    Medications ordered in this encounter:  No orders of the defined types were placed in this encounter.    *If you need refills on other medications prior  to your next appointment, please contact your pharmacy*  Follow-Up: Call back or seek an in-person evaluation if the symptoms worsen or if the condition fails to improve as anticipated.  [facility] [phone]  Other Instructions Please continue your OTC medication. Apply cold compresses as discussed. Take the antibiotic as directed. If not quickly improving/resolving or anything worsening, you need an in-person evaluation ASAP. PLEASE DO NOT DELAY CARE.    If you have been instructed to have an in-person evaluation today at a local Urgent Care  facility, please use the link below. It will take you to a list of all of our available [facility], including address, phone number and hours of operation. Please do not delay care.  [facility]  If you or a family member do not have a primary care provider, use the link below to schedule a visit and establish care. When you choose a [facility] primary care physician or advanced practice provider, you gain a long-term partner in health. Find a Primary Care Provider  Learn more about [facility]'s in-office and virtual care options: [facility] - Get Care Now

## 2023-06-11 LAB — HM MAMMOGRAPHY

## 2023-06-12 ENCOUNTER — Encounter: Payer: Self-pay | Admitting: Family Medicine

## 2023-06-23 ENCOUNTER — Encounter: Payer: Self-pay | Admitting: Family Medicine

## 2023-06-23 ENCOUNTER — Ambulatory Visit (INDEPENDENT_AMBULATORY_CARE_PROVIDER_SITE_OTHER): Admitting: Family Medicine

## 2023-06-23 VITALS — BP 144/71 | HR 70 | Ht 65.0 in | Wt 196.4 lb

## 2023-06-23 DIAGNOSIS — R400 Somnolence: Secondary | ICD-10-CM | POA: Insufficient documentation

## 2023-06-23 DIAGNOSIS — F17200 Nicotine dependence, unspecified, uncomplicated: Secondary | ICD-10-CM

## 2023-06-23 DIAGNOSIS — R21 Rash and other nonspecific skin eruption: Secondary | ICD-10-CM | POA: Insufficient documentation

## 2023-06-23 DIAGNOSIS — Z7689 Persons encountering health services in other specified circumstances: Secondary | ICD-10-CM

## 2023-06-23 MED ORDER — TRIAMCINOLONE ACETONIDE 0.5 % EX OINT: 1.0000 | TOPICAL_OINTMENT | Freq: Two times a day (BID) | CUTANEOUS | 1 refills | Status: AC

## 2023-06-23 NOTE — Assessment & Plan Note (Signed)
 Has had 2 low-dose lung cancer screening CTs, last one was approximately 9 months ago and was lung-RADS 2.  Advised her to reach out to the imaging center to schedule her next CT scan for 3 months from now

## 2023-06-23 NOTE — Assessment & Plan Note (Signed)
 Falls asleep during the middle of the day, fatigued.  Has been told she had sleep apnea after a EGD but has never had a sleep study.  Will get home sleep study.  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

## 2023-06-23 NOTE — Patient Instructions (Addendum)
 It was nice to see you today,  We addressed the following topics today: -I am sending in a new cream that is a steroid only cream that is high potency than what you are currently taking.  Use this on your palms soles and other areas that have a itchy rash. - If you do not get better after using it twice a day for 2 weeks let your dermatologist know. - you can stop taking the oral terbinafine.  I do not think it is helping your symptoms. - I will send in a sleep study order.  Someone should call you to schedule this - Please call the imaging center to see if you need to schedule at your next CT scan of the lungs which should be due in about 3 months.  Have a great day,  Etha Henle, MD

## 2023-06-23 NOTE — Assessment & Plan Note (Signed)
 Palms and soles, soles > palm, and discrete rashes on the right breast and right hip.  Seems most consistent with eczema, perhaps dyshidrotic eczema.  Prescribing triamcinolone 0.5 ointment to use twice a day for no more than 2 weeks.  Advised her she can stop the oral terbinafine since I do not think it is fungal and even so it has been ineffective after multiple months of treatment.  Advised her if after 2 weeks it is not improving she should reach back out to her dermatologist

## 2023-06-23 NOTE — Progress Notes (Signed)
 New Patient Office Visit  Subjective   Patient ID: Nicole Phelps, female    DOB: 10/19/1960  Age: 63 y.o. MRN: [id number]  CC:  Chief Complaint  Patient presents with   New Patient (Initial Visit)    HPI Nicole Phelps presents to establish care Dr. Oneta Rack pt  Patient was being treated for anxiety, asthma, MDD, hypertension, hyperlipidemia, GERD.  Patient has been told she has sleep apnea after an EGD.  Has not had a sleep study.  Would be open to home sleep study.  Patient has a pruritic rash on the soles of her feet, palms, right breast and right hip.  This has been ongoing for about 6 months.  She has seen a dermatologist.  Was told it was likely fungal.  Has been taking an antifungal pill, terbinafine, since January every other month and using topical antifungal/corticosteroid.  Has not had any improvement.  The dermatologist she saw was Dr. Theador Hawthorne.  The terbinafine and cream were prescribed by her PCP.   PMH: anxiety, asthma, b12, mdd, hld, htn, obesity, smoker, vit d, gerd  No hx of acne. Hips, breast, legs, bottoms of feet and face near nos.e.  6 months. Itches.    PSH: Partial hysterectomy, BTL, right TKA, lumpectomy and right, left ankle ORIF  FH:  Father-stroke, mother-arthritis and colon cancer.  Colon cancer in maternal grandmother as well as 2 maternal aunts.  Patient gets colonoscopies every 5 years.  Tobacco use: 1/2 ppd 45 years.   Alcohol use: no Drug use: No Marital status: married.  2 children.  11 grandchildren Employment: retired from [employer].    Screenings:  Colon Cancer: up to date.   Lung Cancer: yearly Breast Cancer: up to date.      Outpatient Encounter Medications as of 06/23/2023  Medication Sig   Acetaminophen (TYLENOL ARTHRITIS PAIN PO) Take by mouth.   albuterol (VENTOLIN HFA) 108 (90 Base) MCG/ACT inhaler USE 2 PUFFS EVERY 6 HOURS  AS NEEDED FOR SHORTNESS OF  BREATH   benzonatate (TESSALON) 200 MG capsule Take 1  perle 3 x / day to prevent cough   busPIRone (BUSPAR) 15 MG tablet TAKE 1 TABLET BY MOUTH THREE TIMES A DAY FOR CHRONIC ANXIETY   cholecalciferol (VITAMIN D) 1000 UNITS tablet Take 2,000 Units by mouth daily.    clotrimazole-betamethasone (LOTRISONE) cream Apply 1 Application topically 2 (two) times daily.   diltiazem (CARDIZEM CD) 120 MG 24 hr capsule TAKE 1 CAPSULE DAILY FOR BLOOD PRESSURE   doxycycline (VIBRA-TABS) 100 MG tablet Take 1 tablet (100 mg total) by mouth 2 (two) times daily.   escitalopram (LEXAPRO) 20 MG tablet TAKE 1 TABLET BY MOUTH DAILY FOR MOOD   fluticasone-salmeterol (WIXELA INHUB) 100-50 MCG/ACT AEPB Inhale 1 puff into the lungs 2 (two) times daily.   MAGNESIUM PO magnesium   Multiple Vitamins-Minerals (ZINC PO) zinc   pantoprazole (PROTONIX) 40 MG tablet Take 40 mg by mouth 2 (two) times daily.   rosuvastatin (CRESTOR) 10 MG tablet TAKE 1 TABLET BY MOUTH EVERY DAY FOR CHOLESTEROL   terbinafine (LAMISIL) 250 MG tablet Take 1 tablet (250 mg total) by mouth daily. Take 1 pill daily for one month, off for 1 month, one a month, etc until gone   triamcinolone ointment (KENALOG) 0.5 % Apply 1 Application topically 2 (two) times daily. For no more than two weeks   azelastine (ASTELIN) 0.1 % nasal spray Place 2 sprays into both nostrils 2 (two) times daily. Use  in each nostril as directed   [DISCONTINUED] Calcium Carb-Cholecalciferol (CALCIUM 1000 + D) 1000-20 MG-MCG TABS  (Patient not taking: Reported on 08/14/2022)   [DISCONTINUED] famotidine (PEPCID) 20 MG tablet TAKE 1 TABLET BY MOUTH TWICE A DAY (Patient not taking: Reported on 02/26/2023)   [DISCONTINUED] fluticasone-salmeterol (ADVAIR) 100-50 MCG/ACT AEPB Inhale 1 puff into the lungs 2 (two) times daily. Rinse mouth and gargle after each use to avoid thrush. (Patient not taking: Reported on 02/26/2023)   [DISCONTINUED] Multiple Vitamin (MULTIVITAMIN) capsule Take 1 capsule by mouth daily. (Patient not taking: Reported on  02/26/2023)   [DISCONTINUED] psyllium (REGULOID) 0.52 g capsule Take 0.52 g by mouth daily. (Patient not taking: Reported on 02/26/2023)   [DISCONTINUED] traMADol (ULTRAM) 50 MG tablet  (Patient not taking: Reported on 02/26/2023)   No facility-administered encounter medications on file as of 06/23/2023.    Past Medical History:  Diagnosis Date   Allergy    Arthritis    Asthma    Benign hematuria 2004   Depression    Hyperlipidemia    Hypertension    TMJ (temporomandibular joint syndrome)    Vitamin D deficiency     Past Surgical History:  Procedure Laterality Date   ANKLE SURGERY Left 2017   ORIF   BLEPHAROPLASTY Bilateral 04/09/2021   BREAST SURGERY Left    lumpectomy benign   KNEE ARTHROSCOPY W/ DEBRIDEMENT Right 11/2017   TOTAL ABDOMINAL HYSTERECTOMY  2003   Ovaries in place. Done for bleeding, had benign pathology.   TOTAL KNEE ARTHROPLASTY Right 02/2019   Dr. Murrell Arrant   TUBAL LIGATION      Family History  Problem Relation Age of Onset   Colon cancer Mother 8       colon   Arthritis Mother    Hypertension Father    Stroke Father    Breast cancer Sister 27   Colon cancer Maternal Grandmother    Heart attack Paternal Grandfather        In his 68s   Colon cancer Maternal Aunt    Colon cancer Maternal Aunt     Social History   Socioeconomic History   Marital status: Married    Spouse name: Not on file   Number of children: Not on file   Years of education: Not on file   Highest education level: Not on file  Occupational History   Not on file  Tobacco Use   Smoking status: Every Day    Current packs/day: 0.50    Average packs/day: 0.5 packs/day for 48.3 years (24.1 ttl pk-yrs)    Types: Cigarettes    Start date: 35   Smokeless tobacco: Never   Tobacco comments:    Has reduced to 5/day.  Vaping Use   Vaping status: Never Used  Substance and Sexual Activity   Alcohol use: Yes    Comment: rare   Drug use: Yes    Types: Marijuana    Comment: 2  joints/week   Sexual activity: Yes    Partners: Male    Birth control/protection: Post-menopausal, Surgical    Comment: Hysterectomy 2003  Other Topics Concern   Not on file  Social History Narrative   Not on file   Social Drivers of Health   Financial Resource Strain: Not on file  Food Insecurity: Low Risk  (02/26/2023)   Received from [facility]   Hunger Vital Sign    Worried About Running Out of Food in the Last Year: Never true    Ran Out of  Food in the Last Year: Never true  Transportation Needs: No Transportation Needs (02/26/2023)   Received from [facility]    In the past 12 months, has lack of reliable transportation kept you from medical appointments, meetings, work or from getting things needed for daily living? : No  Physical Activity: Not on file  Stress: Not on file  Social Connections: Not on file  Intimate Partner Violence: Not on file    ROS     Objective   BP (!) 144/71   Pulse 70   Ht 5\' 5"  (1.651 m)   Wt 196 lb 6.4 oz (89.1 kg)   SpO2 92%   BMI 32.68 kg/m   Physical Exam General: Alert, oriented HEENT: PERRLA, EOMI, moist mucosa CV: Regular rate rhythm no murmurs Pulmonary: Lungs clear bilaterally GI: Soft, normal bowel sounds MSK: Strength bilaterally normal gait Extremities: No pedal edema Skin: Erythematous papular rash on the right breast, right hip, desquamation of the soles and to a lesser extent the palms.     Assessment & Plan:   Encounter to establish care  Daytime somnolence Assessment & Plan: Falls asleep during the middle of the day, fatigued.  Has been told she had sleep apnea after a EGD but has never had a sleep study.  Will get home sleep study.  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  Orders: -     Home sleep test  Rash Assessment & Plan: Palms and soles, soles > palm, and discrete  rashes on the right breast and right hip.  Seems most consistent with eczema, perhaps dyshidrotic eczema.  Prescribing triamcinolone 0.5 ointment to use twice a day for no more than 2 weeks.  Advised her she can stop the oral terbinafine since I do not think it is fungal and even so it has been ineffective after multiple months of treatment.  Advised her if after 2 weeks it is not improving she should reach back out to her dermatologist   Smoker Assessment & Plan: Has had 2 low-dose lung cancer screening CTs, last one was approximately 9 months ago and was lung-RADS 2.  Advised her to reach out to the imaging center to schedule her next CT scan for 3 months from now   Other orders -     Triamcinolone Acetonide; Apply 1 Application topically 2 (two) times daily. For no more than two weeks  Dispense: 60 g; Refill: 1    Return for HTN, hld.   Laneta Pintos, MD

## 2023-06-26 ENCOUNTER — Ambulatory Visit: Payer: PPO | Admitting: Family Medicine

## 2023-08-14 ENCOUNTER — Ambulatory Visit: Payer: Medicare Other | Admitting: Nurse Practitioner

## 2023-09-22 ENCOUNTER — Ambulatory Visit: Admitting: Family Medicine

## 2023-10-08 DIAGNOSIS — H52223 Regular astigmatism, bilateral: Secondary | ICD-10-CM | POA: Diagnosis not present

## 2023-10-08 DIAGNOSIS — H5203 Hypermetropia, bilateral: Secondary | ICD-10-CM | POA: Diagnosis not present

## 2023-10-08 DIAGNOSIS — H02839 Dermatochalasis of unspecified eye, unspecified eyelid: Secondary | ICD-10-CM | POA: Diagnosis not present

## 2023-10-08 DIAGNOSIS — H2513 Age-related nuclear cataract, bilateral: Secondary | ICD-10-CM | POA: Diagnosis not present

## 2023-11-05 DIAGNOSIS — Z0000 Encounter for general adult medical examination without abnormal findings: Secondary | ICD-10-CM | POA: Diagnosis not present

## 2023-11-05 DIAGNOSIS — Z1331 Encounter for screening for depression: Secondary | ICD-10-CM | POA: Diagnosis not present

## 2023-11-05 DIAGNOSIS — R0683 Snoring: Secondary | ICD-10-CM | POA: Diagnosis not present

## 2023-11-24 ENCOUNTER — Other Ambulatory Visit: Payer: Self-pay | Admitting: Nurse Practitioner

## 2023-11-24 DIAGNOSIS — I10 Essential (primary) hypertension: Secondary | ICD-10-CM

## 2023-12-01 DIAGNOSIS — J069 Acute upper respiratory infection, unspecified: Secondary | ICD-10-CM | POA: Diagnosis not present

## 2023-12-01 DIAGNOSIS — Z03818 Encounter for observation for suspected exposure to other biological agents ruled out: Secondary | ICD-10-CM | POA: Diagnosis not present

## 2023-12-01 DIAGNOSIS — R300 Dysuria: Secondary | ICD-10-CM | POA: Diagnosis not present

## 2023-12-01 DIAGNOSIS — R350 Frequency of micturition: Secondary | ICD-10-CM | POA: Diagnosis not present

## 2023-12-16 DIAGNOSIS — R0683 Snoring: Secondary | ICD-10-CM | POA: Diagnosis not present

## 2023-12-25 DIAGNOSIS — M25561 Pain in right knee: Secondary | ICD-10-CM | POA: Diagnosis not present

## 2023-12-25 DIAGNOSIS — M7061 Trochanteric bursitis, right hip: Secondary | ICD-10-CM | POA: Diagnosis not present

## 2024-01-26 DIAGNOSIS — G4733 Obstructive sleep apnea (adult) (pediatric): Secondary | ICD-10-CM | POA: Diagnosis not present

## 2024-01-27 DIAGNOSIS — M25551 Pain in right hip: Secondary | ICD-10-CM | POA: Diagnosis not present

## 2024-02-02 ENCOUNTER — Encounter: Payer: Medicare Other | Admitting: Nurse Practitioner

## 2024-02-03 DIAGNOSIS — G4733 Obstructive sleep apnea (adult) (pediatric): Secondary | ICD-10-CM | POA: Diagnosis not present

## 2024-02-09 DIAGNOSIS — E785 Hyperlipidemia, unspecified: Secondary | ICD-10-CM | POA: Diagnosis not present

## 2024-02-09 DIAGNOSIS — I10 Essential (primary) hypertension: Secondary | ICD-10-CM | POA: Diagnosis not present

## 2024-02-12 DIAGNOSIS — R262 Difficulty in walking, not elsewhere classified: Secondary | ICD-10-CM | POA: Diagnosis not present

## 2024-02-12 DIAGNOSIS — M6281 Muscle weakness (generalized): Secondary | ICD-10-CM | POA: Diagnosis not present

## 2024-02-12 DIAGNOSIS — M7061 Trochanteric bursitis, right hip: Secondary | ICD-10-CM | POA: Diagnosis not present

## 2024-03-08 ENCOUNTER — Encounter: Payer: Medicare Other | Admitting: Nurse Practitioner
